# Patient Record
Sex: Male | Born: 1960
Health system: Southern US, Community
[De-identification: ages and names within clinical notes are randomized; demographics above are authoritative.]

## PROBLEM LIST (undated history)

## (undated) ENCOUNTER — Emergency Department (HOSPITAL_COMMUNITY): Admission: EM | Discharge status: Absent without leave | Payer: Self-pay

## (undated) DIAGNOSIS — M722 Plantar fascial fibromatosis: Secondary | ICD-10-CM

## (undated) DIAGNOSIS — M726 Necrotizing fasciitis: Secondary | ICD-10-CM

## (undated) DIAGNOSIS — F32A Depression, unspecified: Secondary | ICD-10-CM

## (undated) DIAGNOSIS — M1990 Unspecified osteoarthritis, unspecified site: Secondary | ICD-10-CM

## (undated) DIAGNOSIS — E7800 Pure hypercholesterolemia, unspecified: Secondary | ICD-10-CM

## (undated) DIAGNOSIS — E119 Type 2 diabetes mellitus without complications: Secondary | ICD-10-CM

## (undated) DIAGNOSIS — F329 Major depressive disorder, single episode, unspecified: Secondary | ICD-10-CM

## (undated) DIAGNOSIS — I10 Essential (primary) hypertension: Secondary | ICD-10-CM

## (undated) DIAGNOSIS — G4730 Sleep apnea, unspecified: Secondary | ICD-10-CM

---
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
## 2007-01-20 ENCOUNTER — Encounter: Admission: RE | Admit: 2007-01-20 | Discharge: 2007-01-20 | Payer: Self-pay | Admitting: Family Medicine

## 2007-01-20 ENCOUNTER — Ambulatory Visit: Payer: Self-pay | Admitting: Family Medicine

## 2007-01-25 ENCOUNTER — Encounter: Admission: RE | Admit: 2007-01-25 | Discharge: 2007-01-25 | Payer: Self-pay | Admitting: Family Medicine

## 2007-02-03 ENCOUNTER — Ambulatory Visit: Payer: Self-pay | Admitting: Family Medicine

## 2007-03-17 ENCOUNTER — Ambulatory Visit: Payer: Self-pay | Admitting: Family Medicine

## 2007-05-03 ENCOUNTER — Ambulatory Visit: Payer: Self-pay | Admitting: Family Medicine

## 2012-12-24 ENCOUNTER — Encounter (HOSPITAL_COMMUNITY): Payer: Self-pay | Admitting: Emergency Medicine

## 2012-12-24 ENCOUNTER — Emergency Department (HOSPITAL_COMMUNITY)
Admission: EM | Admit: 2012-12-24 | Discharge: 2012-12-25 | Disposition: A | Discharge status: Other Acute Inpatient Hospital | Payer: Self-pay | Attending: Emergency Medicine | Admitting: Emergency Medicine

## 2012-12-24 DIAGNOSIS — R45851 Suicidal ideations: Secondary | ICD-10-CM

## 2012-12-24 DIAGNOSIS — T50992A Poisoning by other drugs, medicaments and biological substances, intentional self-harm, initial encounter: Secondary | ICD-10-CM | POA: Insufficient documentation

## 2012-12-24 DIAGNOSIS — F3289 Other specified depressive episodes: Secondary | ICD-10-CM | POA: Insufficient documentation

## 2012-12-24 DIAGNOSIS — T1491XA Suicide attempt, initial encounter: Secondary | ICD-10-CM

## 2012-12-24 DIAGNOSIS — F329 Major depressive disorder, single episode, unspecified: Secondary | ICD-10-CM | POA: Insufficient documentation

## 2012-12-24 DIAGNOSIS — T450X4A Poisoning by antiallergic and antiemetic drugs, undetermined, initial encounter: Secondary | ICD-10-CM | POA: Insufficient documentation

## 2012-12-24 HISTORY — DX: Plantar fascial fibromatosis: M72.2

## 2012-12-24 HISTORY — DX: Essential (primary) hypertension: I10

## 2012-12-24 HISTORY — DX: Unspecified osteoarthritis, unspecified site: M19.90

## 2012-12-24 LAB — URINALYSIS W MICROSCOPIC + REFLEX CULTURE
Glucose, UA: >1000 mg/dL — AB
Hgb urine dipstick: NEGATIVE
Leukocytes, UA: NEGATIVE
Protein, ur: 30 mg/dL — AB
Specific Gravity, Urine: 1.031 — ABNORMAL HIGH (ref 1.005–1.030)
pH: 5 (ref 5.0–8.0)

## 2012-12-24 LAB — COMPREHENSIVE METABOLIC PANEL
ALT: 32 U/L (ref 0–53)
Albumin: 4.1 g/dL (ref 3.5–5.2)
Alkaline Phosphatase: 83 U/L (ref 39–117)
Chloride: 100 mEq/L (ref 96–112)
GFR calc Af Amer: >90 mL/min (ref >90)
Glucose, Bld: 72 mg/dL (ref 70–99)
Potassium: 3 mEq/L — ABNORMAL LOW (ref 3.5–5.1)
Sodium: 136 mEq/L (ref 135–145)
Total Bilirubin: 0.6 mg/dL (ref 0.3–1.2)
Total Protein: 7.9 g/dL (ref 6.0–8.3)

## 2012-12-24 LAB — SALICYLATE LEVEL: Salicylate Lvl: <2 mg/dL — ABNORMAL LOW (ref 2.8–20.0)

## 2012-12-24 LAB — ETHANOL: Alcohol, Ethyl (B): <11 mg/dL (ref 0–11)

## 2012-12-24 LAB — CBC WITH DIFFERENTIAL/PLATELET
Eosinophils Absolute: 0.1 10*3/uL (ref 0.0–0.7)
Hemoglobin: 16.7 g/dL (ref 13.0–17.0)
Lymphs Abs: 2.3 10*3/uL (ref 0.7–4.0)
MCH: 31.2 pg (ref 26.0–34.0)
Monocytes Relative: 9 % (ref 3–12)
Neutro Abs: 10.5 10*3/uL — ABNORMAL HIGH (ref 1.7–7.7)
Neutrophils Relative %: 74 % (ref 43–77)
Platelets: 233 10*3/uL (ref 150–400)
RBC: 5.36 MIL/uL (ref 4.22–5.81)
WBC: 14.1 10*3/uL — ABNORMAL HIGH (ref 4.0–10.5)

## 2012-12-24 LAB — ACETAMINOPHEN LEVEL: Acetaminophen (Tylenol), Serum: <15 ug/mL (ref 10–30)

## 2012-12-24 LAB — RAPID URINE DRUG SCREEN, HOSP PERFORMED
Amphetamines: NOT DETECTED
Opiates: NOT DETECTED

## 2012-12-24 MED ORDER — SODIUM CHLORIDE 0.9 % IV SOLN
INTRAVENOUS | Status: DC
  Administered 2012-12-24 – 2012-12-25 (×2): via INTRAVENOUS

## 2012-12-24 NOTE — ED Notes (Signed)
Patient has 2 bags of belongings (1 is a backpack) behind nurses station across from room.

## 2012-12-24 NOTE — BH Assessment (Signed)
Assessment Note  Eric Shaw is an 52 y.o. male. Pt brought to [facility] after being found by police having taken an overdose of bendryl.  Pt was evicted from his apartment yesterday and broke back into the apartment today to attempt suicide.  Upon assessment, pt is quite straightforward about his intent to kill himself: "I screwed up: I didn't kill myself."  Pt became quite tearful in describing that he has diabetes that is worsening, causing significant pain in his feet.  He worked at [employer] until a year ago when he was laid off but reports that the pain of being on his feet all day prior to being laid off had become unbearable.  Pt reports he currently has no money and now, no place to stay.  Pt states, "I just don't have the energy to fight anymore."  Pt denies HI/AV.  Pt denies history of any psych treatment previously.  Pt denies any substance abuse and UDS/BAC is negative.  Axis I: Major Depression, single episode Axis II: Deferred Axis III: No past medical history on file. Axis IV: economic problems and housing problems Axis V: 21-30 behavior considerably influenced by delusions or hallucinations OR serious impairment in judgment, communication OR inability to function in almost all areas  Past Medical History: No past medical history on file.  No past surgical history on file.  Family History: No family history on file.  Social History:  reports that he has never smoked. He does not have any smokeless tobacco history on file. He reports that he does not drink alcohol or use illicit drugs.  Additional Social History:  Alcohol / Drug Use Pain Medications: Pt denies any use of alcohol or drugs.  UDS and BAC both negative. History of alcohol / drug use?: No history of alcohol / drug abuse  CIWA: CIWA-Ar BP: 126/75 mmHg Pulse Rate: 99 COWS:    Allergies: Allergies not on file  Home Medications:  (Not in a hospital admission)  OB/GYN Status:  No LMP for male patient.  General  Assessment Data Location of Assessment: [facility] ACT Assessment: Yes Is this a Tele or Face-to-Face Assessment?: Face-to-Face Is this an Initial Assessment or a Re-assessment for this encounter?: Initial Assessment Living Arrangements: Alone Can pt return to current living arrangement?: No (evicted yesterda)     [facility] Crisis Care Plan Living Arrangements: Alone Name of Psychiatrist: none Name of Therapist: none     Risk to self Suicidal Ideation: Yes-Currently Present Suicidal Intent: Yes-Currently Present Is patient at risk for suicide?: Yes Suicidal Plan?: Yes-Currently Present Specify Current Suicidal Plan: overdose Access to Means: Yes Specify Access to Suicidal Means: otc medications What has been your use of drugs/alcohol within the last 12 months?: denies use Previous Attempts/Gestures: No Intentional Self Injurious Behavior: None Family Suicide History: No Recent stressful life event(s): Job Loss;Financial Problems;Other (Comment) (health problems, evicted yesterday) Persecutory voices/beliefs?: No Depression: Yes Depression Symptoms: Despondent;Tearfulness;Isolating;Fatigue;Loss of interest in usual pleasures Substance abuse history and/or treatment for substance abuse?: No Suicide prevention information given to non-admitted patients: Not applicable  Risk to Others Homicidal Ideation: No Thoughts of Harm to Others: No Current Homicidal Intent: No Current Homicidal Plan: No Access to Homicidal Means: No History of harm to others?: No Assessment of Violence: None Noted Does patient have access to weapons?: No Criminal Charges Pending?: Yes Describe Pending Criminal Charges: Breaking and entering Does patient have a court date: Yes Court Date: 01/26/13  Psychosis Hallucinations: None noted Delusions: None noted  Mental Status Report  Appear/Hygiene: Other (Comment) (casual) Eye Contact: Fair Motor Activity: Unremarkable Speech: Logical/coherent Level of  Consciousness: Quiet/awake Mood: Depressed Affect: Sad;Depressed Anxiety Level: None Thought Processes: Coherent;Relevant Judgement: Unimpaired Orientation: Person;Place;Time;Situation Obsessive Compulsive Thoughts/Behaviors: None  Cognitive Functioning Concentration: Normal Memory: Recent Intact;Remote Intact IQ: Average Insight: Good Impulse Control: Good Appetite: Good Weight Loss:  (unknown) Sleep: No Change Total Hours of Sleep: 9 Vegetative Symptoms: None  ADLScreening [facility] Assessment Services) Patient's cognitive ability adequate to safely complete daily activities?: Yes Patient able to express need for assistance with ADLs?: Yes Independently performs ADLs?: Yes (appropriate for developmental age)  Prior Inpatient Therapy Prior Inpatient Therapy: No  Prior Outpatient Therapy Prior Outpatient Therapy: No  ADL Screening (condition at time of admission) Patient's cognitive ability adequate to safely complete daily activities?: Yes Patient able to express need for assistance with ADLs?: Yes Independently performs ADLs?: Yes (appropriate for developmental age)       Abuse/Neglect Assessment (Assessment to be complete while patient is alone) Physical Abuse: Denies Verbal Abuse: Denies Sexual Abuse: Denies Exploitation of patient/patient's resources: Denies     Merchant navy officer (For Healthcare) Advance Directive: Patient does not have advance directive;Patient would not like information    Additional Information 1:1 In Past 12 Months?: No CIRT Risk: No Elopement Risk: No Does patient have medical clearance?: Yes     Disposition:  Disposition Initial Assessment Completed for this Encounter: Yes Disposition of Patient: Inpatient treatment program Type of inpatient treatment program: Adult  On Site Evaluation by:   Reviewed with Physician:    Lorri Frederick 12/24/2012 6:18 PM

## 2012-12-24 NOTE — ED Provider Notes (Signed)
CSN: [id number]     Arrival date & time 12/24/12  1113 History     First MD Initiated Contact with Patient 12/24/12 1120     Chief Complaint  Patient presents with  . Drug Overdose    HPI Pt was seen at 1140. Per Police, EMS and pt report, c/o gradual onset and worsening of persistent depression and SI for the past several days. Pt states he lost his job then was evicted from his apartment yesterday. Pt "broke into my apartment" and took "40 tablets" of benadryl 25mg  in SA at 0400 today. Pt states he was found by the maintenance man, who called 911. Pt states he "knew the pills would put me to sleep and stop my oxygen and I wouldn't fight it."  Denies HI, no N/V/D, no CP/SOB, no abd pain.    No past medical history on file.  No past surgical history on file.   History  Substance Use Topics  . Smoking status: Never Smoker   . Smokeless tobacco: Not on file  . Alcohol Use: No    Review of Systems ROS: Statement: All systems negative except as marked or noted in the HPI; Constitutional: Negative for fever and chills. ; ; Eyes: Negative for eye pain, redness and discharge. ; ; ENMT: Negative for ear pain, hoarseness, nasal congestion, sinus pressure and sore throat. ; ; Cardiovascular: Negative for chest pain, palpitations, diaphoresis, dyspnea and peripheral edema. ; ; Respiratory: Negative for cough, wheezing and stridor. ; ; Gastrointestinal: Negative for nausea, vomiting, diarrhea, abdominal pain, blood in stool, hematemesis, jaundice and rectal bleeding. . ; ; Genitourinary: Negative for dysuria, flank pain and hematuria. ; ; Musculoskeletal: Negative for back pain and neck pain. Negative for swelling and trauma.; ; Skin: Negative for pruritus, rash, abrasions, blisters, bruising and skin lesion.; ; Neuro: Negative for headache, lightheadedness and neck stiffness. Negative for weakness, altered level of consciousness , altered mental status, extremity weakness, paresthesias, involuntary  movement, seizure and syncope.; Psych:  +SI, +SA, no HI, no hallucinations.      Allergies  Review of patient's allergies indicates not on file.  Home Medications  No current outpatient prescriptions on file. BP 154/76  Pulse 123  Temp(Src) 97.8 F (36.6 C)  Resp 19  SpO2 97% Physical Exam 1145: Physical examination:  Nursing notes reviewed; Vital signs and O2 SAT reviewed;  Constitutional: Well developed, Well nourished, Well hydrated, In no acute distress; Head:  Normocephalic, atraumatic; Eyes: EOMI, PERRL, No scleral icterus; ENMT: Mouth and pharynx normal, Mucous membranes moist; Neck: Supple, Full range of motion, No lymphadenopathy; Cardiovascular: Tachycardic and rhythm, No gallop; Respiratory: Breath sounds clear & equal bilaterally, No rales, rhonchi, wheezes.  Speaking full sentences with ease, Normal respiratory effort/excursion; Chest: Nontender, Movement normal; Abdomen: Soft, Nontender, Nondistended, Normal bowel sounds; Genitourinary: No CVA tenderness; Extremities: Pulses normal, No tenderness, No edema, No calf edema or asymmetry.; Neuro: AA&Ox3, Major CN grossly intact.  Speech clear. No gross focal motor or sensory deficits in extremities.; Skin: Color normal, Warm, Dry.; Psych:  +SI, +SA, tearful.     ED Course   Procedures     MDM  MDM Reviewed: nursing note and vitals Interpretation: labs and ECG    Date: 12/24/2012  Rate: 119  Rhythm: sinus tachycardia  QRS Axis: normal  Intervals: normal  ST/T Wave abnormalities: nonspecific T wave changes  Conduction Disutrbances:none  Narrative Interpretation:   Old EKG Reviewed: none available.  Results for orders placed during the hospital encounter of  12/24/12  ACETAMINOPHEN LEVEL      Result Value Range   Acetaminophen (Tylenol), Serum <15.0  10 - 30 ug/mL  ETHANOL      Result Value Range   Alcohol, Ethyl (B) <11  0 - 11 mg/dL  SALICYLATE LEVEL      Result Value Range   Salicylate Lvl <2.0 (*) 2.8 -  20.0 mg/dL  URINE RAPID DRUG SCREEN (HOSP PERFORMED)      Result Value Range   Opiates NONE DETECTED  NONE DETECTED   Cocaine NONE DETECTED  NONE DETECTED   Benzodiazepines NONE DETECTED  NONE DETECTED   Amphetamines NONE DETECTED  NONE DETECTED   Tetrahydrocannabinol NONE DETECTED  NONE DETECTED   Barbiturates NONE DETECTED  NONE DETECTED  CBC WITH DIFFERENTIAL      Result Value Range   WBC 14.1 (*) 4.0 - 10.5 K/uL   RBC 5.36  4.22 - 5.81 MIL/uL   Hemoglobin 16.7  13.0 - 17.0 g/dL   HCT 13.0  86.5 - 78.4 %   MCV 84.5  78.0 - 100.0 fL   MCH 31.2  26.0 - 34.0 pg   MCHC 36.9 (*) 30.0 - 36.0 g/dL   RDW 69.6  29.5 - 28.4 %   Platelets 233  150 - 400 K/uL   Neutrophils Relative % 74  43 - 77 %   Neutro Abs 10.5 (*) 1.7 - 7.7 K/uL   Lymphocytes Relative 16  12 - 46 %   Lymphs Abs 2.3  0.7 - 4.0 K/uL   Monocytes Relative 9  3 - 12 %   Monocytes Absolute 1.2 (*) 0.1 - 1.0 K/uL   Eosinophils Relative 0  0 - 5 %   Eosinophils Absolute 0.1  0.0 - 0.7 K/uL   Basophils Relative 0  0 - 1 %   Basophils Absolute 0.1  0.0 - 0.1 K/uL  COMPREHENSIVE METABOLIC PANEL      Result Value Range   Sodium 136  135 - 145 mEq/L   Potassium 3.0 (*) 3.5 - 5.1 mEq/L   Chloride 100  96 - 112 mEq/L   CO2 23  19 - 32 mEq/L   Glucose, Bld 72  70 - 99 mg/dL   BUN 16  6 - 23 mg/dL   Creatinine, Ser 1.32  0.50 - 1.35 mg/dL   Calcium 44.0  8.4 - 10.2 mg/dL   Total Protein 7.9  6.0 - 8.3 g/dL   Albumin 4.1  3.5 - 5.2 g/dL   AST 61 (*) 0 - 37 U/L   ALT 32  0 - 53 U/L   Alkaline Phosphatase 83  39 - 117 U/L   Total Bilirubin 0.6  0.3 - 1.2 mg/dL   GFR calc non Af Amer >90  >90 mL/min   GFR calc Af Amer >90  >90 mL/min  PROTIME-INR      Result Value Range   Prothrombin Time 13.9  11.6 - 15.2 seconds   INR 1.09  0.00 - 1.49  APTT      Result Value Range   aPTT 29  24 - 37 seconds     1430:  ACT called, will eval for admission.         Laray Anger, DO 12/24/12 1639

## 2012-12-24 NOTE — ED Notes (Signed)
[employer] reports they were notified that pt broke into his apartment after being evicted yesterday. EMS called to scene, pt reports taking 40 25mg  benadryl in attempts to commit suicide. States he took pills at 0400 this morning. On [employer] arrival, they found only 27 open benadryl packages. Pt a+o on arrival. Denies etoh or drug use. bp 173/90, pulse 110, respirations 18, saO2 97% ra, CBG 72. [employer] at bedside

## 2012-12-24 NOTE — ED Notes (Signed)
Pt sleeping. Sitter at bedside.

## 2012-12-24 NOTE — Progress Notes (Signed)
Patient ID: Eric Shaw, male   DOB: [birth year], 52 y.o.   MRN: [id number] Asked to run pt who attempted suicide earlier for admission-UDS negative-pt accepted by [facility] 500-1 bed

## 2012-12-24 NOTE — ED Notes (Signed)
Bed: RESB Expected date:  Expected time:  Means of arrival:  Comments: Overdose on benadryl

## 2012-12-25 ENCOUNTER — Encounter (HOSPITAL_COMMUNITY): Payer: Self-pay | Admitting: *Deleted

## 2012-12-25 ENCOUNTER — Encounter (HOSPITAL_COMMUNITY): Payer: Self-pay | Admitting: Family Medicine

## 2012-12-25 ENCOUNTER — Inpatient Hospital Stay (HOSPITAL_COMMUNITY)
Admission: AD | Admit: 2012-12-25 | Discharge: 2012-12-31 | DRG: 885 | Disposition: A | Discharge status: Home / Self Care | Payer: No Typology Code available for payment source | Attending: Psychiatry | Admitting: Psychiatry

## 2012-12-25 DIAGNOSIS — F322 Major depressive disorder, single episode, severe without psychotic features: Secondary | ICD-10-CM | POA: Diagnosis present

## 2012-12-25 DIAGNOSIS — Z79899 Other long term (current) drug therapy: Secondary | ICD-10-CM

## 2012-12-25 DIAGNOSIS — E1149 Type 2 diabetes mellitus with other diabetic neurological complication: Secondary | ICD-10-CM | POA: Diagnosis present

## 2012-12-25 DIAGNOSIS — E1159 Type 2 diabetes mellitus with other circulatory complications: Secondary | ICD-10-CM

## 2012-12-25 DIAGNOSIS — E1142 Type 2 diabetes mellitus with diabetic polyneuropathy: Secondary | ICD-10-CM | POA: Diagnosis present

## 2012-12-25 DIAGNOSIS — M722 Plantar fascial fibromatosis: Secondary | ICD-10-CM | POA: Diagnosis present

## 2012-12-25 DIAGNOSIS — F332 Major depressive disorder, recurrent severe without psychotic features: Principal | ICD-10-CM | POA: Diagnosis present

## 2012-12-25 DIAGNOSIS — I10 Essential (primary) hypertension: Secondary | ICD-10-CM | POA: Diagnosis present

## 2012-12-25 DIAGNOSIS — E1140 Type 2 diabetes mellitus with diabetic neuropathy, unspecified: Secondary | ICD-10-CM | POA: Diagnosis present

## 2012-12-25 LAB — GLUCOSE, CAPILLARY
Glucose-Capillary: 260 mg/dL — ABNORMAL HIGH (ref 70–99)
Glucose-Capillary: 54 mg/dL — ABNORMAL LOW (ref 70–99)
Glucose-Capillary: 56 mg/dL — ABNORMAL LOW (ref 70–99)
Glucose-Capillary: 58 mg/dL — ABNORMAL LOW (ref 70–99)

## 2012-12-25 MED ORDER — INSULIN ASPART 100 UNIT/ML ~~LOC~~ SOLN
0.0000 [IU] | Freq: Three times a day (TID) | SUBCUTANEOUS | Status: DC
  Administered 2012-12-25: 3 [IU] via SUBCUTANEOUS
  Administered 2012-12-26: 12:00:00 via SUBCUTANEOUS
  Administered 2012-12-26: 5 [IU] via SUBCUTANEOUS
  Administered 2012-12-27 (×2): 2 [IU] via SUBCUTANEOUS
  Administered 2012-12-28: 3 [IU] via SUBCUTANEOUS
  Administered 2012-12-28: 2 [IU] via SUBCUTANEOUS
  Administered 2012-12-29 – 2012-12-30 (×3): 3 [IU] via SUBCUTANEOUS
  Administered 2012-12-31: 2 [IU] via SUBCUTANEOUS

## 2012-12-25 MED ORDER — INSULIN ASPART 100 UNIT/ML ~~LOC~~ SOLN
0.0000 [IU] | Freq: Every day | SUBCUTANEOUS | Status: DC
  Administered 2012-12-25: 3 [IU] via SUBCUTANEOUS
  Administered 2012-12-26: 2 [IU] via SUBCUTANEOUS

## 2012-12-25 MED ORDER — ALUM & MAG HYDROXIDE-SIMETH 200-200-20 MG/5ML PO SUSP: 30.0000 mL | ORAL | Status: DC | PRN

## 2012-12-25 MED ORDER — MAGNESIUM HYDROXIDE 400 MG/5ML PO SUSP: 30.0000 mL | Freq: Every day | ORAL | Status: DC | PRN

## 2012-12-25 MED ORDER — ACETAMINOPHEN 325 MG PO TABS
650.0000 mg | ORAL_TABLET | Freq: Four times a day (QID) | ORAL | Status: DC | PRN
  Administered 2012-12-25: 650 mg via ORAL

## 2012-12-25 MED ORDER — INSULIN GLARGINE 100 UNIT/ML ~~LOC~~ SOLN
55.0000 [IU] | Freq: Two times a day (BID) | SUBCUTANEOUS | Status: DC
  Administered 2012-12-25: 55 [IU] via SUBCUTANEOUS
  Administered 2012-12-26: 17:00:00 via SUBCUTANEOUS
  Administered 2012-12-26 – 2012-12-31 (×11): 55 [IU] via SUBCUTANEOUS

## 2012-12-25 NOTE — ED Notes (Signed)
CBG 118 

## 2012-12-25 NOTE — Progress Notes (Signed)
Psychoeducational Group Note  Date: 12/25/2012 Time:  1015  Group Topic/Focus:  Identifying Needs:   The focus of this group is to help patients identify their personal needs that have been historically problematic and identify healthy behaviors to address their needs.  Participation Level:  Active  Participation Quality:  Appropriate  Affect:  Appropriate  Cognitive:  Oriented  Insight:  Improving  Engagement in Group:  Engaged  Additional Comments:  Pt participated in the group, supported his peers and was supported by them.   Tierra Thoma A 

## 2012-12-25 NOTE — Progress Notes (Signed)
Patient ID: Eric Shaw, male   DOB: [birth year], 52 y.o.   MRN: [id number] 12-25-12@ 1630 1:1 nursing shift : D: pt has remained in the bed since his admission. He has no voiced any complaints. He remains a fall risk. He ate his lunch and will be given supper in his room. A: sitter remains with pt and rn is rounding on him about every hour. R: this pt remains depressed and has a very flat affect. Continues to contract for safety and denies hi/av. The 1:1 remains.

## 2012-12-25 NOTE — [facility] Suicide Risk Assessment (Signed)
Suicide Risk Assessment  Admission Assessment     Nursing information obtained from:  Patient Demographic factors:  Male;Low socioeconomic status;Unemployed Current Mental Status:  Suicidal ideation indicated by patient Loss Factors:  Decrease in vocational status;Legal issues;Financial problems / change in socioeconomic status Historical Factors:  NA Risk Reduction Factors:  Positive social support  CLINICAL FACTORS:   Depression:   Anhedonia Hopelessness Impulsivity Insomnia Severe  COGNITIVE FEATURES THAT CONTRIBUTE TO RISK:  Closed-mindedness Polarized thinking Thought constriction (tunnel vision)    SUICIDE RISK:   Severe:  Frequent, intense, and enduring suicidal ideation, specific plan, no subjective intent, but some objective markers of intent (i.e., choice of lethal method), the method is accessible, some limited preparatory behavior, evidence of impaired self-control, severe dysphoria/symptomatology, multiple risk factors present, and few if any protective factors, particularly a lack of social support.  PLAN OF CARE:  Supportive approach/coping skills                                CBT/mindfulness                                Reassess for antidepressants                                 I certify that inpatient services furnished can reasonably be expected to improve the patient's condition.  Apphia Cropley A 12/25/2012, 3:17 PM

## 2012-12-25 NOTE — [facility] Group Notes (Signed)
[facility] Group Notes: (Clinical Social Work)   12/25/2012      Type of Therapy:  Group Therapy   Participation Level:  Did Not Attend    Ambrose Mantle, LCSW 12/25/2012, 4:30 PM

## 2012-12-25 NOTE — ED Notes (Signed)
CBG 198 

## 2012-12-25 NOTE — Progress Notes (Signed)
Patient ID: Eric Shaw, male   DOB: 07/02/1960, 52 y.o.   MRN: [id number] D: Client is in his room sitting on the bed talking to MHT 1:1. Affect is sad; describes mood is "I'm happy as a clam"; he is ambivalent about rating his depression and anxiety; appearance is WNL. Client is sarcastic during 1:1, "that's just my dark death humor". Denies current SI/HI  A: Continue to encourage group attendance and participation, offer support and assistance in identifying triggers and development of coping skills and encouraged client to share thoughts and feelings to staff. Maintained safety through q 15 minute safety checks by staff.    R: Client is medication compliant and verbalizes safety to staff. He does contract for safety but due to his ambivalence, he continues to be a risk for harm to self, "I didn't take enough (benedryl."

## 2012-12-25 NOTE — Progress Notes (Signed)
Patient ID: Eric Shaw, male   DOB: 11/25/1960, 52 y.o.   MRN: [id number] 12-25-12@ 0745 nursing admission note: pt came in with depression and was found by [employer]pd after taking an overdose of benadryl. He rated his depression at 5 on a scale of 1-10. He was able to contract for safety and denied any hi/av. His stressors are he has some legal issues, unemployed for 18 months and is homeless. After being evicted from his apt he broke in and was arrested for B&E. His admission orders consisted of being on close observation/1:1. His is a diabetic and his CBG on admission was 144. He is a fall risk, had neuropathic pain in both legs, wears reading glasses and has no allergies. He is a smoker of less than 1/2 ppd and refused the nicotine patch. He stated he is single, no children and has no family.  He was escorted to the 500 hall. This pt was pleasant/cooperative and escorted to the 500 hall. Shon Baton w,RN did the admission and will keep the pt throughout the shift.

## 2012-12-25 NOTE — ED Notes (Addendum)
Pt given 2 containers of orange juice and peanut butter with crackers.  Pt alert

## 2012-12-25 NOTE — ED Notes (Addendum)
Pt belongings 1 pt belonging bag with pants,shirts, shoes. 1 black back pack with lap top, ipad, glucose meter, cords, battery, scissors, pens, lantus, food, pens, socks, and a wallet containing two 1 dollar bills and credit cards. Locked in locker 36 by this EMT and Security officer [place]. Also a set of keys

## 2012-12-25 NOTE — Tx Team (Signed)
Initial Interdisciplinary Treatment Plan  PATIENT STRENGTHS: (choose at least two) Average or above average intelligence Supportive family/friends  PATIENT STRESSORS: Legal issue depression    PROBLEM LIST: Problem List/Patient Goals Date to be addressed Date deferred Reason deferred Estimated date of resolution  MD, single episode  12-25-12           Suicide ideation  12-25-12           Diabetes type II 12-25-12           Homeless 12-25-12                  DISCHARGE CRITERIA:  Improved stabilization in mood, thinking, and/or behavior Reduction of life-threatening or endangering symptoms to within safe limits Verbal commitment to aftercare and medication compliance  PRELIMINARY DISCHARGE PLAN: Attend aftercare/continuing care group Placement in alternative living arrangements  PATIENT/FAMIILY INVOLVEMENT: This treatment plan has been presented to and reviewed with the patient, Yecheskel Kurek, and/or family member,   The patient and family have been given the opportunity to ask questions and make suggestions.  Valente David 12/25/2012, 9:18 AM

## 2012-12-25 NOTE — ED Notes (Signed)
Pt given a sprite, ham sandwich, and cheese.
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
## 2012-12-25 NOTE — Progress Notes (Signed)
Patient ID: Unnamed Zeien, male   DOB: 1960-06-17, 52 y.o.   MRN: [id number] 12-25-12 @0830  1:1 note: d: pt came to the unit and is having some suicide ideation; he is able to contract for safety and denies any hi/av. He has been tearful, having some decreased concentration and rather evasive during the admission process. The order was implemented for 1:1 and the sitter is with him. He was given hydration on admission and given something to eat. A: he was somewhat receptive to the 1:1 and was ok with having meals in his room.also told him possible d/c of the 1:1 will be reviewed after the md and extender talk with him. R: he later stated he didn't feel like the 1:1 was necessary. The 1:1 continues.

## 2012-12-25 NOTE — [facility] Group Notes (Signed)
[facility] Group Notes:  (Nursing/MHT/Case Management/Adjunct)  Date:  12/25/2012  Time:  12:11 PM  Type of Therapy:  Psychoeducational Skills  Participation Level:  Did Not Attend  Summary of Progress/Problems: Pt did not attend group he was a new admission.   Jacquelyne Balint Shanta 12/25/2012, 12:11 PM

## 2012-12-25 NOTE — Progress Notes (Signed)
Patient ID: Eric Shaw, male   DOB: [birth year], 52 y.o.   MRN: [id number] 12-25-12 @1230  1:1 nursing note: D: pt stayed in bed most of the am after his admission. He remains safe and ate his breakfast. He bilateral leg pain was resolved. A: staff continues to support this pt and he has voiced no needs or complaints. Md/extender was made aware of this new admission and staff is awaiting any new orders. R: pt was advised he would be seeing MD or extender. This 1:1 continues.

## 2012-12-26 LAB — HEMOGLOBIN A1C: Hgb A1c MFr Bld: 9.8 % — ABNORMAL HIGH (ref <5.7)

## 2012-12-26 LAB — GLUCOSE, CAPILLARY: Glucose-Capillary: 91 mg/dL (ref 70–99)

## 2012-12-26 MED ORDER — GABAPENTIN 300 MG PO CAPS
300.0000 mg | ORAL_CAPSULE | Freq: Three times a day (TID) | ORAL | Status: DC
  Administered 2012-12-26 – 2012-12-29 (×11): 300 mg via ORAL
  Filled 2012-12-26 (×17): qty 1

## 2012-12-26 MED ORDER — DULOXETINE HCL 30 MG PO CPEP
30.0000 mg | ORAL_CAPSULE | Freq: Two times a day (BID) | ORAL | Status: DC
  Administered 2012-12-26 – 2012-12-28 (×5): 30 mg via ORAL
  Filled 2012-12-26 (×10): qty 1

## 2012-12-26 MED ORDER — IBUPROFEN 800 MG PO TABS
800.0000 mg | ORAL_TABLET | Freq: Three times a day (TID) | ORAL | Status: DC | PRN
  Administered 2012-12-26: 800 mg via ORAL
  Filled 2012-12-26: qty 1

## 2012-12-26 MED ORDER — LISINOPRIL 10 MG PO TABS
10.0000 mg | ORAL_TABLET | Freq: Every day | ORAL | Status: DC
  Administered 2012-12-26 – 2012-12-31 (×6): 10 mg via ORAL
  Filled 2012-12-26: qty 14
  Filled 2012-12-26 (×8): qty 1

## 2012-12-26 NOTE — Progress Notes (Signed)
1:1 Nursing note: Pt resting comfortably in bed with eyes closed. Breathing even and unlabored. 1:1 monitoring continued.  

## 2012-12-26 NOTE — Progress Notes (Signed)
The focus of this group is to help patients review their daily goal of treatment and discuss progress on daily workbooks.  During wrap up group tonight, Maxton shared that he had a "better" day today because his pain was manageable and he had gotten a good night's sleep. Patient reported that he was able to maintain a decent, stable mood all day. When asked to identify his support systems that he plans to use once he is discharged, Gladstone stated that he plans to open up to his friend and the friend's family. Patient described his relationship with this family as being a second family to him. 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

## 2012-12-26 NOTE — Progress Notes (Signed)
1:1 Nursing note: Pt resting comfortably in bed with eyes closed. Breathing even and unlabored. 1:1 monitoring continued.

## 2012-12-26 NOTE — Progress Notes (Signed)
Patient ID: Eric Shaw, male   DOB: [birth year], 52 y.o.   MRN: [id number] 12-26-12@ 1700 nursing 1:1 note: d: pt went to the late afternoon got after encouragement from the extender and the RN. He wanted to go outside to recreation but was unable to go due to being on a 1:1. A: praised the pt for going to group he remains with passive SI and apologized to him that he couldn't go to recreation. R:will continue to assess pt and the 1:1 continues.

## 2012-12-26 NOTE — Progress Notes (Signed)
Patient ID: Eric Shaw, male   DOB: 28-Jul-1960, 52 y.o.   MRN: [id number] 12-26-12 @ 0900 nursing 1:1 note; D: pt has been in bed most of the am. He did request ibuprofen for his leg pain. He remains with passive SI but is able to give a vague contract for safety. A: RN obtained order for ibuprofen 800 mg Q 8 hrs prn and administered it. Continuing to round on this patient every hour due to the vague contract as well as him being on a 1:1. RN will attempt to obtain an order for neurontin for the neuropathy in both of his legs.  R: pt was thankful for the ibuprofen. He is not going to groups. The 1:1 continues.

## 2012-12-26 NOTE — Progress Notes (Signed)
Patient ID: Eric Shaw, male   DOB: 1960-11-15, 52 y.o.   MRN: [id number] 12-26-12 @ 1300 Nursing 1:1 note: D: pt has been in bed most of the am. He remains with passive SI and gives a vague contract for safety. He was having neuropathic painin his legs and was given ibuprofen for the pain. New orders were obtained for cymbalta and neurontin. A: new order medications were administered and the pt's 1:1 was renewed. R: his pain was reduced and he seems to be tolerating his new medications well. On his inventory sheet he wrote: slept poor, appetite good, energy low, attention good with his depression and hopelessness both at 5. After discharge he plans to maintain his medications. He stated he will have a problem staying on discharge medications due to cost. The 1:1 remains.

## 2012-12-26 NOTE — [facility] Group Notes (Signed)
[facility] Group Notes:  (Clinical Social Work)  12/26/2012   3:00-4:00PM  Summary of Progress/Problems:   The main focus of today's process group was to   identify the patient's current support system and decide on other supports that can be put in place.  The picture on workbook was used to discuss why additional supports are needed, and a hand-out was distributed with four definitions/levels of support, then used to talk about how patients have given and received all different kinds of support.  An emphasis was placed on using counselor, doctor, therapy groups, 12-step groups, and problem-specific support groups to expand supports.  The patient identified present supports as 2 friends and himself.  Type of Therapy:  Process Group  Participation Level:  Minimal  Participation Quality:  Attentive  Affect:  Blunted  Cognitive:  Oriented  Insight:  Developing/Improving  Engagement in Therapy:  Developing/Improving  Modes of Intervention:  Education,  Support and Processing  Ambrose Mantle, LCSW 12/26/2012, 4:36 PM

## 2012-12-26 NOTE — Progress Notes (Signed)
Patient ID: Eric Shaw, male   DOB: [birth year], 52 y.o.   MRN: [id number] Psychoeducational Group Note  Date:  12/26/2012 Time:  1100am  Group Topic/Focus:  Making Healthy Choices:   The focus of this group is to help patients identify negative/unhealthy choices they were using prior to admission and identify positive/healthier coping strategies to replace them upon discharge.  Participation Level:  Did Not Attend  Participation Quality:    Affect:  Cognitive:  Insight: Engagement in Group:  Valente David 12/26/2012,1:07 PM

## 2012-12-26 NOTE — [facility] Group Notes (Signed)
[facility] Group Notes:  (Nursing/MHT/Case Management/Adjunct)  Date:  12/26/2012  Time:  10:54 AM  Type of Therapy:  Psychoeducational Skills  Participation Level:  Did Not Attend  Eric Shaw 12/26/2012, 10:54 AM

## 2012-12-26 NOTE — Progress Notes (Signed)
1:1 Nursing note ( late entry) Patient attended group and participated tonight. Patient reports having had a better day due to his new medications that have helped to manage his pain. Patient reports passive si and verbally contracts, denies hi/a/v hallucinations. Patient remains on 1:1 for safety  With mht at pt's side. Will continue to monitor.

## 2012-12-26 NOTE — Progress Notes (Signed)
Adult Psychoeducational Group Note  Date:  12/26/2012 Time:  1:48 PM  Group Topic/Focus:  Conflict Resolution:   The focus of this group is to discuss the conflict resolution process and how it may be used upon discharge.   Roanna Banning, [place] N 12/26/2012, 1:48 PM

## 2012-12-26 NOTE — Progress Notes (Signed)
[facility] MD Progress Note  12/26/2012 3:46 PM Eric Shaw  MRN:  [id number] Subjective:  I feel better because I slept today. The medication has helped and my pain is significantly less, it's down to a 1/10 but it was at a 7/10 when I came in. He has not attended groups yet due to sleeping. He rates his depression as a 4/10 and his anxiety at a 4/10. He continues to minimize his his symptoms.  His affect is in direct opposition to his reports of depression. He is flat affected, appears hopeless and helpless, speech is goal directed. He does report that the SI is present, and he is unable to contract for safety at this time.  Diagnosis:  MDD severe with suicide attempt DSM5: Schizophrenia Disorders:  NA Obsessive-Compulsive Disorders:  NA Trauma-Stressor Disorders:  NA Substance/Addictive Disorders:  NA Depressive Disorders:  MDD severe recurrent with suicide attempt    ADL's:  Impaired  Sleep: Poor  Appetite:  Fair  Suicidal Ideation:  Yes, with intent, no plan can not contract for safety at this time. Homicidal Ideation:  denies AEB (as evidenced by):  Psychiatric Specialty Exam: Review of Systems  Constitutional: Negative.  Negative for fever, chills, weight loss, malaise/fatigue and diaphoresis.  HENT: Negative for congestion and sore throat.   Eyes: Negative for blurred vision, double vision and photophobia.  Respiratory: Negative for cough, shortness of breath and wheezing.   Cardiovascular: Negative for chest pain, palpitations and PND.  Gastrointestinal: Negative for heartburn, nausea, vomiting, abdominal pain, diarrhea and constipation.  Musculoskeletal: Negative for myalgias, joint pain and falls.  Neurological: Negative for dizziness, tingling, tremors, sensory change, speech change, focal weakness, seizures, loss of consciousness, weakness and headaches.  Endo/Heme/Allergies: Negative for polydipsia. Does not bruise/bleed easily.  Psychiatric/Behavioral: Negative for  depression, suicidal ideas, hallucinations, memory loss and substance abuse. The patient is not nervous/anxious and does not have insomnia.     Blood pressure 104/65, pulse 77, temperature 98.1 F (36.7 C), temperature source Oral, resp. rate 18, height 5' 11.5" (1.816 m), weight 102.059 kg (225 lb), SpO2 99.00%.Body mass index is 30.95 kg/(m^2).  General Appearance: Disheveled  Eye Solicitor::  Fair  Speech:  Clear and Coherent  Volume:  Decreased  Mood:  Depressed  Affect:  Blunt and Flat  Thought Process:  Goal Directed  Orientation:  Full (Time, Place, and Person)  Thought Content:  WDL  Suicidal Thoughts:  Yes with intent, no plan  Homicidal Thoughts:  No  Memory:  Immediate;   Fair  Judgement:  Impaired  Insight:  Lacking  Psychomotor Activity:  Decreased  Concentration:  Fair  Recall:  Fair  Akathisia:  No  Handed:  Right  AIMS (if indicated):     Assets:  Communication Skills  Sleep:      Current Medications: Current Facility-Administered Medications  Medication Dose Route Frequency Provider Last Rate Last Dose  . acetaminophen (TYLENOL) tablet 650 mg  650 mg Oral Q6H PRN Court Joy, PA-C   650 mg at 12/25/12 0941  . alum & mag hydroxide-simeth (MAALOX/MYLANTA) 200-200-20 MG/5ML suspension 30 mL  30 mL Oral Q4H PRN Court Joy, PA-C      . DULoxetine (CYMBALTA) DR capsule 30 mg  30 mg Oral BID Verne Spurr, PA-C   30 mg at 12/26/12 1101  . gabapentin (NEURONTIN) capsule 300 mg  300 mg Oral TID Verne Spurr, PA-C   300 mg at 12/26/12 1101  . ibuprofen (ADVIL,MOTRIN) tablet 800 mg  800 mg Oral  Q8H PRN Rachael Fee, MD   800 mg at 12/26/12 1610  . insulin aspart (novoLOG) injection 0-15 Units  0-15 Units Subcutaneous TID WC Verne Spurr, PA-C      . insulin aspart (novoLOG) injection 0-5 Units  0-5 Units Subcutaneous QHS Verne Spurr, PA-C   3 Units at 12/25/12 2126  . insulin glargine (LANTUS) injection 55 Units  55 Units Subcutaneous BID Verne Spurr, PA-C    55 Units at 12/26/12 0803  . lisinopril (PRINIVIL,ZESTRIL) tablet 10 mg  10 mg Oral Daily Verne Spurr, PA-C   10 mg at 12/26/12 1101  . magnesium hydroxide (MILK OF MAGNESIA) suspension 30 mL  30 mL Oral Daily PRN Court Joy, PA-C        Lab Results:  Results for orders placed during the hospital encounter of 12/25/12 (from the past 48 hour(s))  GLUCOSE, CAPILLARY     Status: Abnormal   Collection Time    12/25/12  8:19 AM      Result Value Range   Glucose-Capillary 144 (*) 70 - 99 mg/dL  GLUCOSE, CAPILLARY     Status: Abnormal   Collection Time    12/25/12  5:30 PM      Result Value Range   Glucose-Capillary 180 (*) 70 - 99 mg/dL   Comment 1 Documented in Chart     Comment 2 Notify RN    HEMOGLOBIN A1C     Status: Abnormal   Collection Time    12/25/12  7:27 PM      Result Value Range   Hemoglobin A1C 9.8 (*) <5.7 %   Comment: (NOTE)                                                                               According to the ADA Clinical Practice Recommendations for 2011, when     HbA1c is used as a screening test:      >=6.5%   Diagnostic of Diabetes Mellitus               (if abnormal result is confirmed)     5.7-6.4%   Increased risk of developing Diabetes Mellitus     References:Diagnosis and Classification of Diabetes Mellitus,Diabetes     Care,2011,34(Suppl 1):S62-S69 and Standards of Medical Care in             Diabetes - 2011,Diabetes Care,2011,34 (Suppl 1):S11-S61.   Mean Plasma Glucose 235 (*) <117 mg/dL   Comment: Performed at [facility]  GLUCOSE, CAPILLARY     Status: Abnormal   Collection Time    12/25/12  9:11 PM      Result Value Range   Glucose-Capillary 260 (*) 70 - 99 mg/dL   Comment 1 Notify RN    GLUCOSE, CAPILLARY     Status: None   Collection Time    12/26/12  6:27 AM      Result Value Range   Glucose-Capillary 91  70 - 99 mg/dL  GLUCOSE, CAPILLARY     Status: Abnormal   Collection Time    12/26/12 11:30 AM      Result Value  Range   Glucose-Capillary 139 (*) 70 - 99 mg/dL  Comment 1 Notify RN      Physical Findings: AIMS: Facial and Oral Movements Muscles of Facial Expression: None, normal Lips and Perioral Area: None, normal Jaw: None, normal Tongue: None, normal,Extremity Movements Upper (arms, wrists, hands, fingers): None, normal Lower (legs, knees, ankles, toes): None, normal, Trunk Movements Neck, shoulders, hips: None, normal, Overall Severity Severity of abnormal movements (highest score from questions above): None, normal Incapacitation due to abnormal movements: None, normal Patient's awareness of abnormal movements (rate only patient's report): No Awareness, Dental Status Current problems with teeth and/or dentures?: No Does patient usually wear dentures?: No  CIWA:  CIWA-Ar Total: 0 COWS:     Treatment Plan Summary: Daily contact with patient to assess and evaluate symptoms and progress in treatment Medication management  Plan: 1. Continue crisis management and stabilization. 2. Medication management to reduce current symptoms to base line and improve patient's overall level of functioning 3. Treat health problems as indicated. 4. Develop treatment plan to decrease risk of relapse upon discharge and the need for     readmission. 5. Psycho-social education regarding relapse prevention and self care. 6. Health care follow up as needed for medical problems. 7. Continue home medications where appropriate. 8. Will continue 1:1 for safety due to suicidal ideation with intent. 9. Patient will attend groups with 1:1 present. 10. Patient will go to cafeteria for meals with 1:1 present. 11. ELOS: 3-5 days   Medical Decision Making Problem Points:  Established problem, stable/improving (1) Data Points:  Review or order medicine tests (1)  I certify that inpatient services furnished can reasonably be expected to improve the patient's condition.  Rona Ravens. Fares Ramthun RPAC 3:52 PM 12/26/2012

## 2012-12-26 NOTE — Progress Notes (Signed)
Nutrition Brief Note  Patient identified on the Malnutrition Screening Tool (MST) Report  Wt Readings from Last 15 Encounters:  12/25/12 225 lb (102.059 kg)    Body mass index is 30.95 kg/(m^2). Patient meets criteria for obesity, unspecified based on current BMI.   Current diet order is Carbohydrate modified, patient is consuming approximately 100% of meals at this time. Labs and medications reviewed.   Patient reports some weight loss, but unable to state how much. He wasn't actively trying to lose weight, but weight loss is desirable.   No nutrition interventions warranted at this time. If nutrition issues arise, please consult RD.   Linnell Fulling, RD, LDN Pager #: [phone] After-Hours Pager #: [phone]

## 2012-12-27 DIAGNOSIS — F332 Major depressive disorder, recurrent severe without psychotic features: Principal | ICD-10-CM

## 2012-12-27 DIAGNOSIS — X838XXA Intentional self-harm by other specified means, initial encounter: Secondary | ICD-10-CM

## 2012-12-27 LAB — GLUCOSE, CAPILLARY

## 2012-12-27 NOTE — Progress Notes (Signed)
Post 1:1 note-  Eric Shaw continues to have appropriate behavior with group attendance and peer interaction.  Denies SI.  No physical complaints and no prn medications requested. Continue POC and 15' checks for safety.

## 2012-12-27 NOTE — Progress Notes (Signed)
Nursing post 1:1 note- Patient is out in milieu interacting with peers.  States his day has "improved" and denies SI.  Contracts for safety. Compliant with scheduled medications. Affect is bright. Down to cafeteria with peers.  Continue POC and 15' checks for safety.  Remains safe.

## 2012-12-27 NOTE — [facility] Group Notes (Signed)
[facility] LCSW Group Therapy  12/27/2012  1:15 PM   Type of Therapy:  Group Therapy  Participation Level:  Active  Participation Quality:  Appropriate and Attentive  Affect:  Appropriate, Flat and Depressed  Cognitive:  Alert and Appropriate  Insight:  Developing/Improving and Engaged  Engagement in Therapy:  Developing/Improving and Engaged  Modes of Intervention:  Clarification, Confrontation, Discussion, Education, Exploration, Limit-setting, Orientation, Problem-solving, Rapport Building, Dance movement psychotherapist, Socialization and Support  Summary of Progress/Problems: Pt identified obstacles faced currently and processed barriers involved in overcoming these obstacles. Pt identified steps necessary for overcoming these obstacles and explored motivation (internal and external) for facing these difficulties head on. Pt further identified one area of concern in their lives and chose a goal to focus on for today.  Pt shared that his biggest obstacle is life, as he feels he is as low as he can get right now.  Pt states that he plans to focus on getting well and arranging discharge plans, but feels hopeless at this time.  Pt actively participated and was engaged in group discussion.    Eric Shaw, [place] 12/27/2012 2:24 PM

## 2012-12-27 NOTE — [facility] Group Notes (Signed)
[facility] LCSW Aftercare Discharge Planning Group Note   12/27/2012  8:45 AM  Participation Quality:  Alert and Appropriate   Mood/Affect:  Appropriate, Flat and Depressed  Depression Rating:  5  Anxiety Rating:  10  Thoughts of Suicide:  Pt denies SI/HI  Will you contract for safety?   Yes  Current AVH:  Pt denies  Plan for Discharge/Comments:  Pt attended discharge planning group and actively participated in group.  CSW provided pt with today's workbook.  Pt states that he came here for depression and SI.  Pt reports he still doing "rough" today.  Pt states that he was staying in an apartment for the past 4 years but lost his job and due to unable to pay he was evicted on Thursday.  Pt is now homeless.  Pt denies having follow up.  CSW will assess for pt's needs and referrals.  No further needs voiced by pt at this time.    Transportation Means: Pt reports access to transportation  Supports: No supports mentioned at this time  Eric Shaw, LCSWA 12/27/2012 10:00 AM
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
## 2012-12-27 NOTE — Tx Team (Signed)
Interdisciplinary Treatment Plan Update (Adult)  Date: 12/27/2012  Time Reviewed:  9:45 AM  Progress in Treatment: Attending groups: Yes Participating in groups:  Yes Taking medication as prescribed:  Yes Tolerating medication:  Yes Family/Significant othe contact made: No, pt refused  Patient understands diagnosis:  Yes Discussing patient identified problems/goals with staff:  Yes Medical problems stabilized or resolved:  Yes Denies suicidal/homicidal ideation: Yes Issues/concerns per patient self-inventory:  Yes Other:  New problem(s) identified: N/A  Discharge Plan or Barriers: CSW assessing for appropriate referrals.  Pt is homeless as well.    Reason for Continuation of Hospitalization: Anxiety Depression Medication Stabilization  Comments: N/A  Estimated length of stay: 3-5 days  For review of initial/current patient goals, please see plan of care.  Attendees: Patient:     Family:     Physician:  Dr. Javier Glazier 12/27/2012 10:30 AM   Nursing:   Quintella Reichert, RN 12/27/2012 10:30 AM   Clinical Social Worker:  Reyes Ivan, LCSWA 12/27/2012 10:30 AM   Other: Neill Loft, RN 12/27/2012 10:30 AM   Other:  Frankey Shown, MA care coordination 12/27/2012 10:30 AM   Other:  Juline Patch, LCSW 12/27/2012 10:30 AM   Other:     Other:    Other:    Other:    Other:    Other:    Other:     Scribe for Treatment Team:   Carmina Miller, 12/27/2012 10:30 AM

## 2012-12-27 NOTE — Progress Notes (Signed)
Patient ID: Eric Shaw, male   DOB: 1960/07/05, 52 y.o.   MRN: [id number] [facility] MD Progress Note  12/27/2012 4:13 PM Eric Shaw  MRN:  [id number]  Subjective: Patient continued to feel depressed and the suicidal but contracts for safety in the hospital. Patient reported his medication is working for him without side effects. Patient has been placed on one-to-one unwilling to be observed every 15 minutes. He rates his depression as a 4/10 and his anxiety at a 4/10.  Diagnosis:  MDD severe with suicide attempt DSM5: Schizophrenia Disorders:  NA Obsessive-Compulsive Disorders:  NA Trauma-Stressor Disorders:  NA Substance/Addictive Disorders:  NA  Depressive Disorders:  MDD severe recurrent with suicide attempt  ADL's:  Impaired  Sleep: Poor  Appetite:  Fair  Suicidal Ideation:  Yes, with intent, no plan can not contract for safety at this time. Homicidal Ideation:  denies AEB (as evidenced by):  Psychiatric Specialty Exam: Review of Systems  Constitutional: Negative.  Negative for fever, chills, weight loss, malaise/fatigue and diaphoresis.  HENT: Negative for congestion and sore throat.   Eyes: Negative for blurred vision, double vision and photophobia.  Respiratory: Negative for cough, shortness of breath and wheezing.   Cardiovascular: Negative for chest pain, palpitations and PND.  Gastrointestinal: Negative for heartburn, nausea, vomiting, abdominal pain, diarrhea and constipation.  Musculoskeletal: Negative for myalgias, joint pain and falls.  Neurological: Negative for dizziness, tingling, tremors, sensory change, speech change, focal weakness, seizures, loss of consciousness, weakness and headaches.  Endo/Heme/Allergies: Negative for polydipsia. Does not bruise/bleed easily.  Psychiatric/Behavioral: Negative for depression, suicidal ideas, hallucinations, memory loss and substance abuse. The patient is not nervous/anxious and does not have insomnia.     Blood  pressure 125/75, pulse 98, temperature 98.6 F (37 C), temperature source Oral, resp. rate 18, height 5' 11.5" (1.816 m), weight 102.059 kg (225 lb), SpO2 99.00%.Body mass index is 30.95 kg/(m^2).  General Appearance: Disheveled  Eye Solicitor::  Fair  Speech:  Clear and Coherent  Volume:  Decreased  Mood:  Depressed  Affect:  Blunt and Flat  Thought Process:  Goal Directed  Orientation:  Full (Time, Place, and Person)  Thought Content:  WDL  Suicidal Thoughts:  Yes with intent, no plan  Homicidal Thoughts:  No  Memory:  Immediate;   Fair  Judgement:  Impaired  Insight:  Lacking  Psychomotor Activity:  Decreased  Concentration:  Fair  Recall:  Fair  Akathisia:  No  Handed:  Right  AIMS (if indicated):     Assets:  Communication Skills  Sleep:  Number of Hours: 4.5   Current Medications: Current Facility-Administered Medications  Medication Dose Route Frequency Provider Last Rate Last Dose  . acetaminophen (TYLENOL) tablet 650 mg  650 mg Oral Q6H PRN Court Joy, PA-C   650 mg at 12/25/12 0941  . alum & mag hydroxide-simeth (MAALOX/MYLANTA) 200-200-20 MG/5ML suspension 30 mL  30 mL Oral Q4H PRN Court Joy, PA-C      . DULoxetine (CYMBALTA) DR capsule 30 mg  30 mg Oral BID Verne Spurr, PA-C   30 mg at 12/27/12 0102  . gabapentin (NEURONTIN) capsule 300 mg  300 mg Oral TID Verne Spurr, PA-C   300 mg at 12/27/12 1141  . ibuprofen (ADVIL,MOTRIN) tablet 800 mg  800 mg Oral Q8H PRN Rachael Fee, MD   800 mg at 12/26/12 0925  . insulin aspart (novoLOG) injection 0-15 Units  0-15 Units Subcutaneous TID WC Verne Spurr, PA-C   2 Units at 12/27/12  1139  . insulin aspart (novoLOG) injection 0-5 Units  0-5 Units Subcutaneous QHS Verne Spurr, PA-C   2 Units at 12/26/12 2155  . insulin glargine (LANTUS) injection 55 Units  55 Units Subcutaneous BID Verne Spurr, PA-C   55 Units at 12/27/12 [id number]  . lisinopril (PRINIVIL,ZESTRIL) tablet 10 mg  10 mg Oral Daily Verne Spurr, PA-C    10 mg at 12/27/12 9562  . magnesium hydroxide (MILK OF MAGNESIA) suspension 30 mL  30 mL Oral Daily PRN Court Joy, PA-C        Lab Results:  Results for orders placed during the hospital encounter of 12/25/12 (from the past 48 hour(s))  GLUCOSE, CAPILLARY     Status: Abnormal   Collection Time    12/25/12  5:30 PM      Result Value Range   Glucose-Capillary 180 (*) 70 - 99 mg/dL   Comment 1 Documented in Chart     Comment 2 Notify RN    HEMOGLOBIN A1C     Status: Abnormal   Collection Time    12/25/12  7:27 PM      Result Value Range   Hemoglobin A1C 9.8 (*) <5.7 %   Comment: (NOTE)                                                                               According to the ADA Clinical Practice Recommendations for 2011, when     HbA1c is used as a screening test:      >=6.5%   Diagnostic of Diabetes Mellitus               (if abnormal result is confirmed)     5.7-6.4%   Increased risk of developing Diabetes Mellitus     References:Diagnosis and Classification of Diabetes Mellitus,Diabetes     Care,2011,34(Suppl 1):S62-S69 and Standards of Medical Care in             Diabetes - 2011,Diabetes Care,2011,34 (Suppl 1):S11-S61.   Mean Plasma Glucose 235 (*) <117 mg/dL   Comment: Performed at [facility]  GLUCOSE, CAPILLARY     Status: Abnormal   Collection Time    12/25/12  9:11 PM      Result Value Range   Glucose-Capillary 260 (*) 70 - 99 mg/dL   Comment 1 Notify RN    GLUCOSE, CAPILLARY     Status: None   Collection Time    12/26/12  6:27 AM      Result Value Range   Glucose-Capillary 91  70 - 99 mg/dL  GLUCOSE, CAPILLARY     Status: Abnormal   Collection Time    12/26/12 11:30 AM      Result Value Range   Glucose-Capillary 139 (*) 70 - 99 mg/dL   Comment 1 Notify RN    GLUCOSE, CAPILLARY     Status: Abnormal   Collection Time    12/26/12  4:53 PM      Result Value Range   Glucose-Capillary 233 (*) 70 - 99 mg/dL  GLUCOSE, CAPILLARY     Status:  Abnormal   Collection Time    12/26/12  9:38 PM      Result Value  Range   Glucose-Capillary 210 (*) 70 - 99 mg/dL   Comment 1 Documented in Chart    GLUCOSE, CAPILLARY     Status: None   Collection Time    12/27/12  6:21 AM      Result Value Range   Glucose-Capillary 92  70 - 99 mg/dL  GLUCOSE, CAPILLARY     Status: Abnormal   Collection Time    12/27/12 11:21 AM      Result Value Range   Glucose-Capillary 121 (*) 70 - 99 mg/dL   Comment 1 Documented in Chart      Physical Findings: AIMS: Facial and Oral Movements Muscles of Facial Expression: None, normal Lips and Perioral Area: None, normal Jaw: None, normal Tongue: None, normal,Extremity Movements Upper (arms, wrists, hands, fingers): None, normal Lower (legs, knees, ankles, toes): None, normal, Trunk Movements Neck, shoulders, hips: None, normal, Overall Severity Severity of abnormal movements (highest score from questions above): None, normal Incapacitation due to abnormal movements: None, normal Patient's awareness of abnormal movements (rate only patient's report): No Awareness, Dental Status Current problems with teeth and/or dentures?: No Does patient usually wear dentures?: No  CIWA:  CIWA-Ar Total: 0 COWS:     Treatment Plan Summary: Daily contact with patient to assess and evaluate symptoms and progress in treatment Medication management  Plan: 1. Continue crisis management and stabilization. 2. Medication management to reduce current symptoms to base line and improve patient's overall level of functioning no medication changes made today as patient is currently adjusting his current medication;  3. Treat health problems as indicated. 4. Develop treatment plan to decrease risk of relapse upon discharge and the need for     readmission. 5. Psycho-social education regarding relapse prevention and self care. 6. Health care follow up as needed for medical problems. 7. Continue home medications where  appropriate. 8. Will discontinue 1:1 for safety due to contracting for safety  9. Patient will attend groups  10. ELOS: 3-4 days   Medical Decision Making Problem Points:  Established problem, stable/improving (1) Data Points:  Review or order medicine tests (1)  I certify that inpatient services furnished can reasonably be expected to improve the patient's condition.   Payne Garske,JANARDHAHA R. 12/27/2012 4:16 PM

## 2012-12-27 NOTE — Progress Notes (Signed)
[facility] Post 1:1 Observation Documentation  For the first (8) hours following discontinuation of 1:1 precautions, a progress note entry by nursing staff should be documented at least every 2 hours, reflecting the patient's behavior, condition, mood, and conversation.  Use the progress notes for additional entries.  Time 1:1 discontinued:  1540  Patient's Behavior:  Patient resting in bed with eyes closed.  Patient's Condition:  Respirations even and unlabored.  Patient appeared to be in no apparent distress.  Patient's Conversation:  Patient was asleep when writer went into patient's room.  Angeline Slim M 12/27/2012, 11:46 PM

## 2012-12-27 NOTE — Progress Notes (Signed)
Recreation Therapy Notes  Date: 08.25.2014 Time: 3:00pm Location: [address] Dayroom  Group Topic: Stress Management  Goal Area(s) Addresses:  Patient will verbalize importance of using healthy stress management.  Patient will identify stress management technique of choice.  Behavioral Response: Attentive, Appropriate    Intervention: Stress Management  Activity: Activity: Deep Breathing & Progressive Muscle Relaxation. LRT instructed patients on completing Progressive Muscle Relaxation. LRT instructed patients on completing deep belly breathing.   Education:  Pharmacologist, Discharge Planning  Education Outcome: Acknowledges understanding  Clinical Observations/Feedback: Patient actively participated in progressive muscle relaxation, patient was observed to observe peer interaction in deep breathing techniques. Patient expressed no discomfort during either technique. Patient made no contributions to wrap up discussion, but appeared to actively listen as he maintained appropriate eye contact with speaker, as well as nodding in agreement with points of interest.    Jearl Klinefelter, LRT/CTRS  Jearl Klinefelter 12/27/2012 4:09 PM

## 2012-12-27 NOTE — Progress Notes (Signed)
Adult Psychoeducational Group Note  Date:  12/27/2012 Time:  8:28 PM  Group Topic/Focus:  Wrap-Up Group:   The focus of this group is to help patients review their daily goal of treatment and discuss progress on daily workbooks.  Participation Level:  Active  Participation Quality:  Appropriate and Attentive  Affect:  Appropriate  Cognitive:  Alert and Appropriate  Insight: Appropriate  Engagement in Group:  Improving  Modes of Intervention:  Discussion and Support  Additional Comments:  Pt participated in group and shared if he could do anything on a day like today he would love to sky dive. He said he has had a good day.  Reynolds Bowl 12/27/2012, 8:28 PM

## 2012-12-27 NOTE — [facility] Suicide Risk Assessment (Signed)
[facility] Adult Inpatient Family/Significant Other Suicide Prevention Education  Suicide Prevention Education:   Patient Refusal for Family/Significant Other Suicide Prevention Education: The patient has refused to provide written consent for family/significant other to be provided Family/Significant Other Suicide Prevention Education during admission and/or prior to discharge.  Physician notified.  CSW provided suicide prevention information with patient.    The suicide prevention education provided includes the following:  Suicide risk factors  Suicide prevention and interventions  National Suicide Hotline telephone number  [facility] assessment telephone number  [facility] Emergency Assistance 911  [facility] and/or Residential Mobile Crisis Unit telephone number   Reyes Ivan, [place] 12/27/2012 10:37 AM

## 2012-12-27 NOTE — Progress Notes (Signed)
Post 1:1 note- Gilles is out in milieu attending groups and affect and mood are appropriate.  He continues to affirm personal safety by denying SI ti this Clinical research associate.  Support and encouragement offered.  Continue POC and evaluation of treatment goals. Continue 15' checks for safety.

## 2012-12-28 DIAGNOSIS — E1142 Type 2 diabetes mellitus with diabetic polyneuropathy: Secondary | ICD-10-CM | POA: Diagnosis present

## 2012-12-28 DIAGNOSIS — E1140 Type 2 diabetes mellitus with diabetic neuropathy, unspecified: Secondary | ICD-10-CM | POA: Diagnosis present

## 2012-12-28 DIAGNOSIS — M722 Plantar fascial fibromatosis: Secondary | ICD-10-CM | POA: Diagnosis present

## 2012-12-28 LAB — GLUCOSE, CAPILLARY
Glucose-Capillary: 67 mg/dL — ABNORMAL LOW (ref 70–99)
Glucose-Capillary: 118 mg/dL — ABNORMAL HIGH (ref 70–99)
Glucose-Capillary: 142 mg/dL — ABNORMAL HIGH (ref 70–99)

## 2012-12-28 MED ORDER — NICOTINE POLACRILEX 2 MG MT GUM
2.0000 mg | CHEWING_GUM | OROMUCOSAL | Status: DC | PRN
  Administered 2012-12-28 – 2012-12-30 (×2): 2 mg via ORAL

## 2012-12-28 MED ORDER — DULOXETINE HCL 30 MG PO CPEP
90.0000 mg | ORAL_CAPSULE | Freq: Every day | ORAL | Status: DC
  Administered 2012-12-28: 60 mg via ORAL
  Administered 2012-12-29: 90 mg via ORAL
  Filled 2012-12-28 (×4): qty 3

## 2012-12-28 NOTE — Progress Notes (Signed)
Patient ID: Eric Shaw, male   DOB: [birth year], 52 y.o.   MRN: [id number] [facility] MD Progress Note  12/28/2012 1:55 PM Eric Shaw  MRN:  [id number]  Subjective: Patient complained of feeling depressed and suicidal but contracts for safety in the hospital. Patient is ruminated about his psychosocial stresses and has no plans how to face them when discharged. "Im having a rough day today." States he can't say why he is feeling more depressed today. He is tearful and presents with profound depressed affect. He reports his depression is a 8/10 and his anxiety is a 5-6/10. He still has suicidal ideation but does contract for safety.  Diagnosis:  MDD severe with suicide attempt DSM5: Schizophrenia Disorders:  NA Obsessive-Compulsive Disorders:  NA Trauma-Stressor Disorders:  NA Substance/Addictive Disorders:  NA  Depressive Disorders:  MDD severe recurrent with suicide attempt  ADL's:  Impaired  Sleep: Poor  Appetite:  Fair  Suicidal Ideation:  Yes, with intent, no plan can not contract for safety at this time. Homicidal Ideation:  denies AEB (as evidenced by):  Psychiatric Specialty Exam: Review of Systems  Constitutional: Negative.  Negative for fever, chills, weight loss, malaise/fatigue and diaphoresis.  HENT: Negative for congestion and sore throat.   Eyes: Negative for blurred vision, double vision and photophobia.  Respiratory: Negative for cough, shortness of breath and wheezing.   Cardiovascular: Negative for chest pain, palpitations and PND.  Gastrointestinal: Negative for heartburn, nausea, vomiting, abdominal pain, diarrhea and constipation.  Musculoskeletal: Negative for myalgias, joint pain and falls.  Neurological: Negative for dizziness, tingling, tremors, sensory change, speech change, focal weakness, seizures, loss of consciousness, weakness and headaches.  Endo/Heme/Allergies: Negative for polydipsia. Does not bruise/bleed easily.  Psychiatric/Behavioral:  Negative for depression, suicidal ideas, hallucinations, memory loss and substance abuse. The patient is not nervous/anxious and does not have insomnia.     Blood pressure 165/89, pulse 106, temperature 98.6 F (37 C), temperature source Oral, resp. rate 17, height 5' 11.5" (1.816 m), weight 102.059 kg (225 lb), SpO2 99.00%.Body mass index is 30.95 kg/(m^2).  General Appearance: Disheveled  Eye Solicitor::  Fair  Speech:  Clear and Coherent  Volume:  Decreased  Mood:  Depressed  Affect:  Tearful and sobbing  Thought Process:  Goal Directed  Orientation:  Full (Time, Place, and Person)  Thought Content:  WDL  Suicidal Thoughts:  Yes with intent, no plan  Homicidal Thoughts:  No  Memory:  Immediate;   Fair  Judgement:  Impaired  Insight:  Lacking  Psychomotor Activity:  Decreased  Concentration:  Fair  Recall:  Fair  Akathisia:  No  Handed:  Right  AIMS (if indicated):     Assets:  Communication Skills  Sleep:  Number of Hours: 4.5   Current Medications: Current Facility-Administered Medications  Medication Dose Route Frequency Provider Last Rate Last Dose  . acetaminophen (TYLENOL) tablet 650 mg  650 mg Oral Q6H PRN Court Joy, PA-C   650 mg at 12/25/12 0941  . alum & mag hydroxide-simeth (MAALOX/MYLANTA) 200-200-20 MG/5ML suspension 30 mL  30 mL Oral Q4H PRN Court Joy, PA-C      . DULoxetine (CYMBALTA) DR capsule 30 mg  30 mg Oral BID Verne Spurr, PA-C   30 mg at 12/28/12 0753  . gabapentin (NEURONTIN) capsule 300 mg  300 mg Oral TID Verne Spurr, PA-C   300 mg at 12/28/12 1206  . ibuprofen (ADVIL,MOTRIN) tablet 800 mg  800 mg Oral Q8H PRN Rachael Fee, MD  800 mg at 12/26/12 0925  . insulin aspart (novoLOG) injection 0-15 Units  0-15 Units Subcutaneous TID WC Verne Spurr, PA-C   2 Units at 12/28/12 1204  . insulin aspart (novoLOG) injection 0-5 Units  0-5 Units Subcutaneous QHS Verne Spurr, PA-C   2 Units at 12/26/12 2155  . insulin glargine (LANTUS) injection  55 Units  55 Units Subcutaneous BID Verne Spurr, PA-C   55 Units at 12/28/12 0756  . lisinopril (PRINIVIL,ZESTRIL) tablet 10 mg  10 mg Oral Daily Verne Spurr, PA-C   10 mg at 12/28/12 0754  . magnesium hydroxide (MILK OF MAGNESIA) suspension 30 mL  30 mL Oral Daily PRN Court Joy, PA-C        Lab Results:  Results for orders placed during the hospital encounter of 12/25/12 (from the past 48 hour(s))  GLUCOSE, CAPILLARY     Status: Abnormal   Collection Time    12/26/12  4:53 PM      Result Value Range   Glucose-Capillary 233 (*) 70 - 99 mg/dL  GLUCOSE, CAPILLARY     Status: Abnormal   Collection Time    12/26/12  9:38 PM      Result Value Range   Glucose-Capillary 210 (*) 70 - 99 mg/dL   Comment 1 Documented in Chart    GLUCOSE, CAPILLARY     Status: None   Collection Time    12/27/12  6:21 AM      Result Value Range   Glucose-Capillary 92  70 - 99 mg/dL  GLUCOSE, CAPILLARY     Status: Abnormal   Collection Time    12/27/12 11:21 AM      Result Value Range   Glucose-Capillary 121 (*) 70 - 99 mg/dL   Comment 1 Documented in Chart    GLUCOSE, CAPILLARY     Status: Abnormal   Collection Time    12/27/12  4:56 PM      Result Value Range   Glucose-Capillary 140 (*) 70 - 99 mg/dL  GLUCOSE, CAPILLARY     Status: Abnormal   Collection Time    12/27/12  8:55 PM      Result Value Range   Glucose-Capillary 184 (*) 70 - 99 mg/dL  GLUCOSE, CAPILLARY     Status: Abnormal   Collection Time    12/28/12  6:26 AM      Result Value Range   Glucose-Capillary 67 (*) 70 - 99 mg/dL  GLUCOSE, CAPILLARY     Status: Abnormal   Collection Time    12/28/12  7:36 AM      Result Value Range   Glucose-Capillary 118 (*) 70 - 99 mg/dL  GLUCOSE, CAPILLARY     Status: Abnormal   Collection Time    12/28/12 11:53 AM      Result Value Range   Glucose-Capillary 132 (*) 70 - 99 mg/dL    Physical Findings: AIMS: Facial and Oral Movements Muscles of Facial Expression: None, normal Lips and  Perioral Area: None, normal Jaw: None, normal Tongue: None, normal,Extremity Movements Upper (arms, wrists, hands, fingers): None, normal Lower (legs, knees, ankles, toes): None, normal, Trunk Movements Neck, shoulders, hips: None, normal, Overall Severity Severity of abnormal movements (highest score from questions above): None, normal Incapacitation due to abnormal movements: None, normal Patient's awareness of abnormal movements (rate only patient's report): No Awareness, Dental Status Current problems with teeth and/or dentures?: No Does patient usually wear dentures?: No  CIWA:  CIWA-Ar Total: 0 COWS:  Treatment Plan Summary: Daily contact with patient to assess and evaluate symptoms and progress in treatment Medication management  Plan: 1. Will increase cymbalta to 60 mg po qd for better symptom control  2. Will also call for diabetic consult. 3. Will continue current plan of care as written at this time. 4. ELOS: 2-4 days. 5. Will refer to [facility] Transitional care team for psychosocial support, person for assistance with housing, disability, access to medications etc.  Medical Decision Making Problem Points:  Established problem, worsening (2) and New problem, with additional work-up planned (4) Data Points:  Review of new medications or change in dosage (2)  I certify that inpatient services furnished can reasonably be expected to improve the patient's condition.   Rona Ravens. Mashburn RPAC 1:59 PM 12/28/2012  Reviewed the information documented and agree with the treatment plan.  Gerlene Glassburn,JANARDHAHA R. 12/28/2012 9:41 PM

## 2012-12-28 NOTE — Progress Notes (Signed)
D:  Patient up and present in the milieu this shift.  Has been attending and participating in groups.  Affect is flat/blunted.  He rates his depression at 4 and hopelessness at 5 today.  He states he still has some off and on thoughts of self harm, but agrees to seek out staff if feeling unsafe on the unit.  He reports sleep and appetite are both fair.  A:  Medications given as prescribed.  Encouraged patient to participate in all groups.  Also encouraged patient to come to me if he was having any thoughts of suicide or self harm.  Offered support and encouragement.   R:  Patient agrees to seek out staff if feeling unsafe.  Tolerating medications well.  Interacting well with staff and peers.

## 2012-12-28 NOTE — [facility] Group Notes (Addendum)
[facility] LCSW Group Therapy      Feelings About Diagnosis 1:15 - 2:30 PM         12/28/2012  2:58 PM    Type of Therapy:  Group Therapy  Participation Level:  Active  Participation Quality:  Appropriate  Affect:  Appropriate  Cognitive:  Alert and Appropriate  Insight:  Developing/Improving and Engaged  Engagement in Therapy:  Developing/Improving and Engaged  Modes of Intervention:  Discussion, Education, Exploration, Problem-Solving, Rapport Building, Support  Summary of Progress/Problems:  Patient actively participated in group. Patient discussed past and present diagnosis and the effects it has had on  life.  Patient talked about family and society being judgmental and the stigma associated with having a mental health diagnosis.   He also shared that family would feel weak and inadequate because he had failed at his suicide attempt.  Wynn Banker 12/28/2012  2:58 PM

## 2012-12-28 NOTE — Progress Notes (Signed)
The focus of this group is to educate the patient on the purpose and policies of crisis stabilization and provide a format to answer questions about their admission.  The group details unit policies and expectations of patients while admitted.  Patient attended 0900 nurse education orientation group this morning.  Patient actively participated, appropriate affect, alert, appropriate insight and engagement.  Patient will work on discharge goals today.  

## 2012-12-28 NOTE — Progress Notes (Signed)
D: Patient resting in bed with eyes closed.  Respirations even and unlabored.  Patient appears to be in no apparent distress. A: Staff to monitor Q 15 mins for safety.   R:Patient remains safe on the unit.  

## 2012-12-28 NOTE — Progress Notes (Signed)
Patient ID: Eric Shaw, male   DOB: 1961/01/06, 52 y.o.   MRN: [id number]  No s/s of distress at this time. Pt resting in bed with eyes closed.

## 2012-12-28 NOTE — Progress Notes (Signed)
Adult Psychoeducational Group Note  Date:  12/28/2012 Time:  11:07 PM  Group Topic/Focus:  Goals Group:   The focus of this group is to help patients establish daily goals to achieve during treatment and discuss how the patient can incorporate goal setting into their daily lives to aide in recovery.  Participation Level:  Active  Participation Quality:  Appropriate  Affect:  Appropriate  Cognitive:  Appropriate  Insight: Appropriate  Engagement in Group:  Engaged  Modes of Intervention:  Discussion  Additional Comments:  Pt stated that she had a good day, enjoyed the others groups went well, and that's what made his day go well.   Terie Purser R 12/28/2012, 11:07 PM

## 2012-12-28 NOTE — Progress Notes (Signed)
Adult Psychoeducational Group Note  Date:  12/28/2012 Time:  12:15 PM  Group Topic/Focus:  Recovery Goals:   The focus of this group is to identify appropriate goals for recovery and establish a plan to achieve them.  Participation Level:  Minimal  Participation Quality:  Appropriate and Attentive  Affect:  Flat  Cognitive:  Alert and Appropriate  Insight: Appropriate  Engagement in Group:  Engaged  Modes of Intervention:  Discussion, Socialization and Support  Additional Comments:  Pt came to group and shared that pain and pride were standing between him and recovery. Pt plans on changing this by staying on medication for pain management and reaching out more to friends and family to ask for help.  Cathlean Cower 12/28/2012, 12:15 PM

## 2012-12-28 NOTE — Progress Notes (Signed)
Recreation Therapy Notes  Date: 08.26.2014 Time: 2:45pm Location: [address] Dayroom  Group Topic: Animal Assisted Activities  Goal Area(s) Addresses:  Patient will interact appropriately with dog team.    Behavioral Response: Appropriate, Attentive, Engaged  Education: Coping Skill   Education Outcome: Acknowledges understanding  Clinical Observations/Feedback: Dog Team: Ellis & handler. Patient interacted appropriately with peer, dog team and LRT.    Jayliah Benett L Awanda Wilcock, LRT/CTRS  Tashena Ibach L 12/28/2012 4:49 PM 

## 2012-12-29 DIAGNOSIS — F322 Major depressive disorder, single episode, severe without psychotic features: Secondary | ICD-10-CM

## 2012-12-29 LAB — GLUCOSE, CAPILLARY
Glucose-Capillary: 162 mg/dL — ABNORMAL HIGH (ref 70–99)
Glucose-Capillary: 125 mg/dL — ABNORMAL HIGH (ref 70–99)
Glucose-Capillary: 103 mg/dL — ABNORMAL HIGH (ref 70–99)
Glucose-Capillary: 108 mg/dL — ABNORMAL HIGH (ref 70–99)
Glucose-Capillary: 79 mg/dL (ref 70–99)

## 2012-12-29 MED ORDER — GABAPENTIN 400 MG PO CAPS
400.0000 mg | ORAL_CAPSULE | Freq: Three times a day (TID) | ORAL | Status: DC
  Administered 2012-12-29 – 2012-12-31 (×6): 400 mg via ORAL
  Filled 2012-12-29: qty 42
  Filled 2012-12-29: qty 1
  Filled 2012-12-29: qty 42
  Filled 2012-12-29: qty 1
  Filled 2012-12-29: qty 42
  Filled 2012-12-29 (×6): qty 1

## 2012-12-29 MED ORDER — DULOXETINE HCL 60 MG PO CPEP
60.0000 mg | ORAL_CAPSULE | Freq: Every day | ORAL | Status: DC
  Administered 2012-12-30 – 2012-12-31 (×2): 60 mg via ORAL
  Filled 2012-12-29: qty 14
  Filled 2012-12-29 (×3): qty 1

## 2012-12-29 NOTE — Tx Team (Signed)
Interdisciplinary Treatment Plan Update (Adult)  Date: 12/29/2012  Time Reviewed:  9:45 AM  Progress in Treatment: Attending groups: Yes Participating in groups:  Yes Taking medication as prescribed:  Yes Tolerating medication:  Yes Family/Significant othe contact made: No, pt refused Patient understands diagnosis:  Yes Discussing patient identified problems/goals with staff:  Yes Medical problems stabilized or resolved:  Yes Denies suicidal/homicidal ideation: Yes Issues/concerns per patient self-inventory:  Yes Other:  New problem(s) identified: N/A  Discharge Plan or Barriers: Pt will follow up at [facility] for medication management and therapy. Pt has also met with Cathleen Fears, homeless liaison for housing support upon d/c and transitional team through [facility]  Reason for Continuation of Hospitalization: Anxiety Depression Medication Stabilization  Comments: Med adjustments today. Pt continues to have labile mood  Estimated length of stay: 3-5 days  For review of initial/current patient goals, please see plan of care.  Attendees: Patient:     Family:     Physician:  Dr. Javier Glazier 12/29/2012 10:37 AM   Nursing:   Harold Barban, RN 12/29/2012 10:37 AM   Clinical Social Worker:  Reyes Ivan, LCSWA 12/29/2012 10:37 AM   Other: Verne Spurr, PA 12/29/2012 10:37 AM   Other:  Frankey Shown, MA care coordination 12/29/2012 10:37 AM   Other:  Juline Patch, LCSW 12/29/2012 10:37 AM   Other:  Onnie Boer, RN 12/29/2012 10:37 AM   Other:    Other:    Other:    Other:    Other:    Other:     Scribe for Treatment Team:   Carmina Miller, 12/29/2012 10:37 AM

## 2012-12-29 NOTE — Progress Notes (Signed)
Inpatient Diabetes Program Recommendations  AACE/ADA: New Consensus Statement on Inpatient Glycemic Control (2013)  Target Ranges:  Prepandial:   less than 140 mg/dL      Peak postprandial:   less than 180 mg/dL (1-2 hours)      Critically ill patients:  140 - 180 mg/dL   Reason for Visit: Elevated HgbA1C and Hyperglycemia  Patient presented to the ED via ambulance after being found by a maintenance man. He reports taking an overdose of 40 Benadryl tablets in a suicide attempt. He was evaluated and felt to be in need of acute psychiatric hospitalization for stabilization and crisis management.  Pt on Lantus 55 units bid at home.  No bolus insulin.  Weight 102 kg. Blood sugars uncontrolled at home - 9.8% HgbA1C   Results for [name], [name] (MRN [id number]) as of 12/29/2012 08:36  Ref. Range 12/24/2012 11:54  Sodium Latest Range: 135-145 mEq/L 136  Potassium Latest Range: 3.5-5.1 mEq/L 3.0 (L)  Chloride Latest Range: 96-112 mEq/L 100  CO2 Latest Range: 19-32 mEq/L 23  BUN Latest Range: 6-23 mg/dL 16  Creatinine Latest Range: 0.50-1.35 mg/dL 5.78  Calcium Latest Range: 8.4-10.5 mg/dL 46.9  GFR calc non Af Amer Latest Range: >90 mL/min >90  GFR calc Af Amer Latest Range: >90 mL/min >90  Glucose Latest Range: 70-99 mg/dL 72  Results for [name], [name] (MRN [id number]) as of 12/29/2012 08:36  Ref. Range 12/25/2012 19:27  Hemoglobin A1C Latest Range: <5.7 % 9.8 (H)  Results for [name], [name] (MRN [id number]) as of 12/29/2012 08:36  Ref. Range 12/28/2012 07:36 12/28/2012 11:53 12/28/2012 17:11 12/28/2012 21:21 12/29/2012 06:06  Glucose-Capillary Latest Range: 70-99 mg/dL 536 (H) 644 (H) 034 (H) 142 (H) 88   Blood sugars fairly well controlled as inpatient. Appears to have too much basal on board. (102kg x .5 = 51 units TDD)   Inpatient Diabetes Program Recommendations Insulin - Basal: Decrease Lantus to 45 units bid Insulin - Meal Coverage: Add meal coverage insulin - Novolog 6 units tidwc if pt  eats >50% meal HgbA1C: 9.8% - uncontrolled Outpatient Referral: Would benefit from OP Diabetes Education Diet: Encourage pt to make healthy choices using portion control in the cafeteria  Would benefit from 50% basal, 50% bolus insulin rather than high dose of basal.   Pt will need PCP to manage DM. ? Blood glucose monitoring at home. Take logbook to MD for adjustments in insulin regimen.  Will follow while inpatient.  Thank you. Ailene Ards, RD, LDN, CDE Inpatient Diabetes Coordinator [phone]

## 2012-12-29 NOTE — [facility] Group Notes (Signed)
[facility] LCSW Group Therapy  12/29/2012  1:15 PM   Type of Therapy:  Group Therapy  Participation Level:  Active  Participation Quality:  Appropriate and Attentive  Affect:  Appropriate, Flat and Depressed  Cognitive:  Alert and Appropriate  Insight:  Developing/Improving and Engaged  Engagement in Therapy:  Developing/Improving and Engaged  Modes of Intervention:  Clarification, Confrontation, Discussion, Education, Exploration, Limit-setting, Orientation, Problem-solving, Rapport Building, Dance movement psychotherapist, Socialization and Support  Summary of Progress/Problems: The topic for group today was emotional regulation.  This group focused on both positive and negative emotion identification and allowed group members to process ways to identify feelings, regulate negative emotions, and find healthy ways to manage internal/external emotions. Group members were asked to reflect on a time when their reaction to an emotion led to a negative outcome and explored how alternative responses using emotion regulation would have benefited them. Group members were also asked to discuss a time when emotion regulation was utilized when a negative emotion was experienced. Pt shared that he has always been taught to "suck it up" and not show emotions, which caused him to finally break.  Pt states that he feels he purposely crashed his life by losing everything.  CSW notes that this is the first time pt discussed this.  Pt states that he feels he is closer to overcoming his depression and SI.  Pt actively participated and was engaged in group discussion.    Eric Shaw, [place] 12/29/2012 2:53 PM

## 2012-12-29 NOTE — Progress Notes (Signed)
Patient ID: Eric Shaw, male   DOB: [birth year], 52 y.o.   MRN: [id number]  [facility] MD Progress Note  12/29/2012 2:51 PM Eric Shaw  MRN:  [id number]  Subjective: Patient has increased blood pressure and feeling dizzy and wobbly when walking. He has feeling better and rated as depressed 3/10, anxiety 4/10 and denied suicidal thoughts at the time evaluation. He contracts for safety in the hospital. Patient is ruminated about his psychosocial stresses and has no plans how to face them when discharged. He has no dysphoric movements and feels some what better. He wants to try to live without depression and starting feeling some hope and help by talking with people from here and a lady from [place],   Diagnosis:  MDD severe with suicide attempt DSM5: Schizophrenia Disorders:  NA Obsessive-Compulsive Disorders:  NA Trauma-Stressor Disorders:  NA Substance/Addictive Disorders:  NA  Depressive Disorders:  MDD severe recurrent with suicide attempt  ADL's:  Impaired  Sleep: Poor  Appetite:  Fair  Suicidal Ideation:  Yes, with intent, no plan can not contract for safety at this time. Homicidal Ideation:  denies AEB (as evidenced by):  Psychiatric Specialty Exam: Review of Systems  Constitutional: Negative.  Negative for fever, chills, weight loss, malaise/fatigue and diaphoresis.  HENT: Negative for congestion and sore throat.   Eyes: Negative for blurred vision, double vision and photophobia.  Respiratory: Negative for cough, shortness of breath and wheezing.   Cardiovascular: Negative for chest pain, palpitations and PND.  Gastrointestinal: Negative for heartburn, nausea, vomiting, abdominal pain, diarrhea and constipation.  Musculoskeletal: Negative for myalgias, joint pain and falls.  Neurological: Negative for dizziness, tingling, tremors, sensory change, speech change, focal weakness, seizures, loss of consciousness, weakness and headaches.  Endo/Heme/Allergies: Negative for  polydipsia. Does not bruise/bleed easily.  Psychiatric/Behavioral: Negative for depression, suicidal ideas, hallucinations, memory loss and substance abuse. The patient is not nervous/anxious and does not have insomnia.     Blood pressure 127/81, pulse 106, temperature 97.7 F (36.5 C), temperature source Oral, resp. rate 20, height 5' 11.5" (1.816 m), weight 102.059 kg (225 lb), SpO2 99.00%.Body mass index is 30.95 kg/(m^2).  General Appearance: Disheveled  Eye Solicitor::  Fair  Speech:  Clear and Coherent  Volume:  Decreased  Mood:  Depressed  Affect:  Tearful and sobbing  Thought Process:  Goal Directed  Orientation:  Full (Time, Place, and Person)  Thought Content:  WDL  Suicidal Thoughts:  Yes with intent, no plan  Homicidal Thoughts:  No  Memory:  Immediate;   Fair  Judgement:  Impaired  Insight:  Lacking  Psychomotor Activity:  Decreased  Concentration:  Fair  Recall:  Fair  Akathisia:  No  Handed:  Right  AIMS (if indicated):     Assets:  Communication Skills  Sleep:  Number of Hours: 4.75   Current Medications: Current Facility-Administered Medications  Medication Dose Route Frequency Provider Last Rate Last Dose  . acetaminophen (TYLENOL) tablet 650 mg  650 mg Oral Q6H PRN Court Joy, PA-C   650 mg at 12/25/12 0941  . alum & mag hydroxide-simeth (MAALOX/MYLANTA) 200-200-20 MG/5ML suspension 30 mL  30 mL Oral Q4H PRN Court Joy, PA-C      . DULoxetine (CYMBALTA) DR capsule 90 mg  90 mg Oral Daily Verne Spurr, PA-C   90 mg at 12/29/12 7829  . gabapentin (NEURONTIN) capsule 300 mg  300 mg Oral TID Verne Spurr, PA-C   300 mg at 12/29/12 1210  . ibuprofen (ADVIL,MOTRIN) tablet 800  mg  800 mg Oral Q8H PRN Rachael Fee, MD   800 mg at 12/26/12 1610  . insulin aspart (novoLOG) injection 0-15 Units  0-15 Units Subcutaneous TID WC Verne Spurr, PA-C   3 Units at 12/29/12 1213  . insulin aspart (novoLOG) injection 0-5 Units  0-5 Units Subcutaneous QHS Verne Spurr,  PA-C   2 Units at 12/26/12 2155  . insulin glargine (LANTUS) injection 55 Units  55 Units Subcutaneous BID Verne Spurr, PA-C   55 Units at 12/29/12 0810  . lisinopril (PRINIVIL,ZESTRIL) tablet 10 mg  10 mg Oral Daily Verne Spurr, PA-C   10 mg at 12/29/12 0806  . magnesium hydroxide (MILK OF MAGNESIA) suspension 30 mL  30 mL Oral Daily PRN Court Joy, PA-C      . nicotine polacrilex (NICORETTE) gum 2 mg  2 mg Oral PRN Nehemiah Settle, MD   2 mg at 12/28/12 1436    Lab Results:  Results for orders placed during the hospital encounter of 12/25/12 (from the past 48 hour(s))  GLUCOSE, CAPILLARY     Status: Abnormal   Collection Time    12/26/12  4:53 PM      Result Value Range   Glucose-Capillary 233 (*) 70 - 99 mg/dL  GLUCOSE, CAPILLARY     Status: Abnormal   Collection Time    12/26/12  9:38 PM      Result Value Range   Glucose-Capillary 210 (*) 70 - 99 mg/dL   Comment 1 Documented in Chart    GLUCOSE, CAPILLARY     Status: None   Collection Time    12/27/12  6:21 AM      Result Value Range   Glucose-Capillary 92  70 - 99 mg/dL  GLUCOSE, CAPILLARY     Status: Abnormal   Collection Time    12/27/12 11:21 AM      Result Value Range   Glucose-Capillary 121 (*) 70 - 99 mg/dL   Comment 1 Documented in Chart    GLUCOSE, CAPILLARY     Status: Abnormal   Collection Time    12/27/12  4:56 PM      Result Value Range   Glucose-Capillary 140 (*) 70 - 99 mg/dL  GLUCOSE, CAPILLARY     Status: Abnormal   Collection Time    12/27/12  8:55 PM      Result Value Range   Glucose-Capillary 184 (*) 70 - 99 mg/dL  GLUCOSE, CAPILLARY     Status: Abnormal   Collection Time    12/28/12  6:26 AM      Result Value Range   Glucose-Capillary 67 (*) 70 - 99 mg/dL  GLUCOSE, CAPILLARY     Status: Abnormal   Collection Time    12/28/12  7:36 AM      Result Value Range   Glucose-Capillary 118 (*) 70 - 99 mg/dL  GLUCOSE, CAPILLARY     Status: Abnormal   Collection Time    12/28/12  11:53 AM      Result Value Range   Glucose-Capillary 132 (*) 70 - 99 mg/dL    Physical Findings: AIMS: Facial and Oral Movements Muscles of Facial Expression: None, normal Lips and Perioral Area: None, normal Jaw: None, normal Tongue: None, normal,Extremity Movements Upper (arms, wrists, hands, fingers): None, normal Lower (legs, knees, ankles, toes): None, normal, Trunk Movements Neck, shoulders, hips: None, normal, Overall Severity Severity of abnormal movements (highest score from questions above): None, normal Incapacitation due to abnormal movements: None, normal  Patient's awareness of abnormal movements (rate only patient's report): No Awareness, Dental Status Current problems with teeth and/or dentures?: No Does patient usually wear dentures?: No  CIWA:  CIWA-Ar Total: 0 COWS:     Treatment Plan Summary: Daily contact with patient to assess and evaluate symptoms and progress in treatment Medication management  Plan: 1. Continue Cymbalta to 60 mg po qd for better symptom control  2. Increase Neurontin 400 mg TID 3. Continue current plan of care as written at this time. 4. ELOS: 2-4 days. 5. Will refer to [facility] Transitional care team for psychosocial support, person for assistance with housing, disability, access to medications etc.  Medical Decision Making Problem Points:  Established problem, worsening (2) and New problem, with additional work-up planned (4) Data Points:  Review of new medications or change in dosage (2)  I certify that inpatient services furnished can reasonably be expected to improve the patient's condition.    Nehemiah Settle., MD 12/29/2012 2:51 PM

## 2012-12-29 NOTE — Progress Notes (Signed)
D: Patient pleasant and cooperative with staff and peers. Patient's affect is appropriate to circumstance and mood is anxious. He reported on the self inventory sheet that he slept well, appetite and ability to pay attention are both good and energy level is normal. Patient rated depression and feelings of hopelessness "3". He voiced that he feels a whole lot better today as opposed to yesterday. Writer observed that patient is interactive with peers in the milieu, engaged in groups and smiling/laughing with others on the unit.  A: Support and encouragement provided to patient. Scheduled medications administered per MD orders. Maintain Q15 minute checks for safety.  R: Patient receptive. Passive SI, but contracts for safety. Denies HI and AVH. Patient remains safe.

## 2012-12-29 NOTE — Progress Notes (Signed)
Adult Psychoeducational Group Note  Date:  12/29/2012 Time:  10:53 AM  Group Topic/Focus:  Personal Choices and Values:   The focus of this group is to help patients assess and explore the importance of values in their lives, how their values affect their decisions, how they express their values and what opposes their expression.  Participation Level:  Active  Participation Quality:  Appropriate  Affect:  Appropriate  Cognitive:  Alert and Appropriate  Insight: Appropriate  Engagement in Group:  Engaged  Modes of Intervention:  Discussion  Additional Comments:  Today's group was a therapeutic activity. Pt attended group and was very engaged.    Reygan Heagle K 12/29/2012, 10:53 AM 

## 2012-12-29 NOTE — Progress Notes (Signed)
Recreation Therapy Notes  Date: 08.27.2014 Time: 3:00pm Location: [address] Dayroom  Group Topic: Leisure Education  Goal Area(s) Addresses:  Patient will verbalize activity of interest by end of group session. Patient will verbalize the ability to use positive leisure/recreation as a coping mechanism.  Behavioral Response: Bizarre Affect, Sleeping,  Intervention: Informational Worksheet  Activity: Leisure Time Management. Patients were given an worksheet outlining approximately 16 hours of the day, using various colors patients were asked to identify how they use their time. For example red = time at work, blue = time for self-care.   Education:  Discharge Planning.   Education Outcome: Acknowledges understanding  Clinical Observations/Feedback: Patient attended group session, however he did not engage in activity. Patient presented with flat affect. Patient was moving very slowly as he entered group session. Patient sat in chair near windows in day room and leaned head back, falling asleep. At approximately 3:15pm patient awoke, at this time patient affect changed from flat to bizarre, as patient seemed to be disconnected from situation. Patient presented with eyes bugged and rigid body movements, as well as being unable or unwilling to lift feet from floor, as he shuffled when he walked. LRT approached patient to assist him with leaving group session, LRT stated patient name, patient did not respond to LRT and made no eye contact with LRT. LRT assisted patient to the door, where she was met by MHT. Patient then stated he wanted to lay down in his room. MHT took care of patient at this time.   Marykay Lex Magaline Steinberg, LRT/CTRS  Jearl Klinefelter 12/29/2012 4:15 PM

## 2012-12-29 NOTE — [facility] Group Notes (Signed)
[facility] LCSW Aftercare Discharge Planning Group Note  12/29/2012  8:45 AM  Participation Quality:  Alert and Appropriate   Mood/Affect:  Appropriate and Bright  Depression Rating:  3  Anxiety Rating:  3  Thoughts of Suicide:  Pt denies HI, reports passive SI  Will you contract for safety?   Yes  Current AVH:  Pt denies  Plan for Discharge/Comments:  Pt attended discharge planning group and actively participated in group.  CSW provided pt with today's workbook.  Pt reports feeling much better today, stating he is "extremely happy".  Pt explained that he opened up in groups yesterday and was able to relate to others and realizes he is not alone.  Pt states that he is more hopeful today.  Pt states that he wakes up with SI every morning but decides to not act on it daily.  Pt met with Cathleen Fears, homeless liaison yesterday and dicussed housing options upon d/c.  Pt is open to staying at the shelter in [place].  Pt will follow up at [facility] for medication management and therapy.  No further needs voiced by pt at this time.    Transportation Means: Pt reports access to transportation  Supports: No supports mentioned at this time  Eric Shaw, LCSWA 12/29/2012 10:08 AM

## 2012-12-29 NOTE — Progress Notes (Signed)
Adult Psychoeducational Group Note  Date:  12/29/2012 Time:  11:00AM Group Topic/Focus:  Personal Deverlopment  Participation Level:  Active  Participation Quality:  Appropriate and Attentive  Affect:  Appropriate  Cognitive:  Alert and Appropriate  Insight: Appropriate  Engagement in Group:  Engaged  Modes of Intervention:  Discussion  Additional Comments:  Pt. Was attentive and appropriate during today's group discussion. Pt. Was able to participate in group on values. Pt was able to communicate on is the way you are living now in line with those values. Pt shared that he is going to improve friendship and love.   Bing Plume D 12/29/2012, 11:33 AM

## 2012-12-29 NOTE — Progress Notes (Signed)
Agree with assessment and plan Eric Shaw A. Eric Shaw, M.D. 

## 2012-12-29 NOTE — Progress Notes (Signed)
Adult Psychoeducational Group Note  Date:  12/29/2012 Time:  8:59 PM  Group Topic/Focus:  Wrap-Up Group:   The focus of this group is to help patients review their daily goal of treatment and discuss progress on daily workbooks.  Participation Level:  Active  Participation Quality:  Appropriate  Affect:  Appropriate  Cognitive:  Alert  Insight: Appropriate  Engagement in Group:  Engaged  Modes of Intervention:  Discussion  Additional Comments:  Pt stated that he had a pretty good day even though he had some physically problems for the past few hours. Pt stated that he also discovered that he can love again, and thought that was something that he had lost. He now has hopes that he can love himself again some day. He also stated that he was not very pleased with the interaction that he had with the dr.  Kaleen Odea R 12/29/2012, 8:59 PM

## 2012-12-30 LAB — GLUCOSE, CAPILLARY
Glucose-Capillary: 75 mg/dL (ref 70–99)
Glucose-Capillary: 167 mg/dL — ABNORMAL HIGH (ref 70–99)

## 2012-12-30 NOTE — Progress Notes (Signed)
Adult Psychoeducational Group Note  Date:  12/30/2012 Time:  9:00 AM  Group Topic/Focus:  Rediscovering Joy:   The focus of this group is to explore various ways to relieve stress in a positive manner.  Participation Level:  Active  Participation Quality:  Appropriate and Attentive  Affect:  Appropriate  Cognitive:  Alert and Oriented  Insight: Appropriate  Engagement in Group:  Engaged  Modes of Intervention:  Discussion  Additional Comments:  Pt. participated in warm-up exercises at the beginning of group.  Harold Barban E 12/30/2012, 10:08 AM

## 2012-12-30 NOTE — Progress Notes (Signed)
Patient ID: Eric Shaw, male   DOB: 1960-08-25, 52 y.o.   MRN: [id number]  [facility] MD Progress Note  12/30/2012 2:06 PM Eric Shaw  MRN:  [id number]  Subjective: Patient states he is having a rough day due to not knowing what his discharge status is, and not being clear on where he is going. He now knows that he is going to [employer] and feels better about his options for follow up. He states the groups have helped and he feels much better about leaving tomorrow. He states having a set plan is helpful.   He rates his depression at a 3/10, which is down from the 12/10 when he arrived and his anxiety is 8-9/10 which he feels is due to the coming changes in his life. Diagnosis:  MDD severe with suicide attempt Depressive Disorders:  MDD severe recurrent with suicide attempt  ADL's:  Impaired  Sleep: not too well Appetite:  Fair  Suicidal Ideation:  Denies Homicidal Ideation:  denies AEB (as evidenced by):  Psychiatric Specialty Exam: Review of Systems  Constitutional: Negative.  Negative for fever, chills, weight loss, malaise/fatigue and diaphoresis.  HENT: Negative for congestion and sore throat.   Eyes: Negative for blurred vision, double vision and photophobia.  Respiratory: Negative for cough, shortness of breath and wheezing.   Cardiovascular: Negative for chest pain, palpitations and PND.  Gastrointestinal: Negative for heartburn, nausea, vomiting, abdominal pain, diarrhea and constipation.  Musculoskeletal: Negative for myalgias, joint pain and falls.  Neurological: Negative for dizziness, tingling, tremors, sensory change, speech change, focal weakness, seizures, loss of consciousness, weakness and headaches.  Endo/Heme/Allergies: Negative for polydipsia. Does not bruise/bleed easily.  Psychiatric/Behavioral: Negative for depression, suicidal ideas, hallucinations, memory loss and substance abuse. The patient is not nervous/anxious and does not have insomnia.     Blood  pressure 135/88, pulse 109, temperature 99.3 F (37.4 C), temperature source Oral, resp. rate 16, height 5' 11.5" (1.816 m), weight 102.059 kg (225 lb), SpO2 99.00%.Body mass index is 30.95 kg/(m^2).  General Appearance: fairly groomed  Patent attorney::  Fair  Speech:  Clear and Coherent  Volume:  normal  Mood:  euthymic  Affect:  congruent  Thought Process:  Goal Directed  Orientation:  Full (Time, Place, and Person)  Thought Content:  WDL  Suicidal Thoughts:  denies  Homicidal Thoughts:  No  Memory:  Immediate;   Fair  Judgement:  fair  Insight:  present  Psychomotor Activity:  Decreased  Concentration:  Fair  Recall:  Fair  Akathisia:  No  Handed:  Right  AIMS (if indicated):     Assets:  Communication Skills  Sleep:  Number of Hours: 4.75   Current Medications: Current Facility-Administered Medications  Medication Dose Route Frequency Provider Last Rate Last Dose  . acetaminophen (TYLENOL) tablet 650 mg  650 mg Oral Q6H PRN Court Joy, PA-C   650 mg at 12/25/12 0941  . alum & mag hydroxide-simeth (MAALOX/MYLANTA) 200-200-20 MG/5ML suspension 30 mL  30 mL Oral Q4H PRN Court Joy, PA-C      . DULoxetine (CYMBALTA) DR capsule 60 mg  60 mg Oral Daily Nehemiah Settle, MD   60 mg at 12/30/12 0824  . gabapentin (NEURONTIN) capsule 400 mg  400 mg Oral TID Nehemiah Settle, MD   400 mg at 12/30/12 1220  . ibuprofen (ADVIL,MOTRIN) tablet 800 mg  800 mg Oral Q8H PRN Rachael Fee, MD   800 mg at 12/26/12 0925  . insulin aspart (novoLOG)  injection 0-15 Units  0-15 Units Subcutaneous TID WC Verne Spurr, PA-C   3 Units at 12/30/12 1221  . insulin aspart (novoLOG) injection 0-5 Units  0-5 Units Subcutaneous QHS Verne Spurr, PA-C   2 Units at 12/26/12 2155  . insulin glargine (LANTUS) injection 55 Units  55 Units Subcutaneous BID Verne Spurr, PA-C   55 Units at 12/30/12 [phone]  . lisinopril (PRINIVIL,ZESTRIL) tablet 10 mg  10 mg Oral Daily Verne Spurr, PA-C    10 mg at 12/30/12 7846  . magnesium hydroxide (MILK OF MAGNESIA) suspension 30 mL  30 mL Oral Daily PRN Court Joy, PA-C      . nicotine polacrilex (NICORETTE) gum 2 mg  2 mg Oral PRN Nehemiah Settle, MD   2 mg at 12/28/12 1436    Lab Results:  Results for orders placed during the hospital encounter of 12/25/12 (from the past 48 hour(s))  GLUCOSE, CAPILLARY     Status: Abnormal   Collection Time    12/26/12  4:53 PM      Result Value Range   Glucose-Capillary 233 (*) 70 - 99 mg/dL  GLUCOSE, CAPILLARY     Status: Abnormal   Collection Time    12/26/12  9:38 PM      Result Value Range   Glucose-Capillary 210 (*) 70 - 99 mg/dL   Comment 1 Documented in Chart    GLUCOSE, CAPILLARY     Status: None   Collection Time    12/27/12  6:21 AM      Result Value Range   Glucose-Capillary 92  70 - 99 mg/dL  GLUCOSE, CAPILLARY     Status: Abnormal   Collection Time    12/27/12 11:21 AM      Result Value Range   Glucose-Capillary 121 (*) 70 - 99 mg/dL   Comment 1 Documented in Chart    GLUCOSE, CAPILLARY     Status: Abnormal   Collection Time    12/27/12  4:56 PM      Result Value Range   Glucose-Capillary 140 (*) 70 - 99 mg/dL  GLUCOSE, CAPILLARY     Status: Abnormal   Collection Time    12/27/12  8:55 PM      Result Value Range   Glucose-Capillary 184 (*) 70 - 99 mg/dL  GLUCOSE, CAPILLARY     Status: Abnormal   Collection Time    12/28/12  6:26 AM      Result Value Range   Glucose-Capillary 67 (*) 70 - 99 mg/dL  GLUCOSE, CAPILLARY     Status: Abnormal   Collection Time    12/28/12  7:36 AM      Result Value Range   Glucose-Capillary 118 (*) 70 - 99 mg/dL  GLUCOSE, CAPILLARY     Status: Abnormal   Collection Time    12/28/12 11:53 AM      Result Value Range   Glucose-Capillary 132 (*) 70 - 99 mg/dL    Physical Findings: AIMS: Facial and Oral Movements Muscles of Facial Expression: None, normal Lips and Perioral Area: None, normal Jaw: None, normal Tongue:  None, normal,Extremity Movements Upper (arms, wrists, hands, fingers): None, normal Lower (legs, knees, ankles, toes): None, normal, Trunk Movements Neck, shoulders, hips: None, normal, Overall Severity Severity of abnormal movements (highest score from questions above): None, normal Incapacitation due to abnormal movements: None, normal Patient's awareness of abnormal movements (rate only patient's report): No Awareness, Dental Status Current problems with teeth and/or dentures?: No Does patient usually  wear dentures?: No  CIWA:  CIWA-Ar Total: 0 COWS:     Treatment Plan Summary: Daily contact with patient to assess and evaluate symptoms and progress in treatment Medication management  Plan: 1. Continue Cymbalta to 60 mg po qd for better symptom control  2. Increase Neurontin 400 mg TID 3. Continue current plan of care as written at this time. 4. ELOS: D/C tomorrow as planned. 5. Will refer to [facility] Transitional care team for psychosocial support, person for assistance with housing, disability, access to medications etc.  Medical Decision Making Problem Points:  Established problem, worsening (2) and New problem, with additional work-up planned (4) Data Points:  Review of new medications or change in dosage (2)  I certify that inpatient services furnished can reasonably be expected to improve the patient's condition.   Rona Ravens. [place] RPAC 2:13 PM 12/30/2012  Reviewed the information documented and agree with the treatment plan.  Constantino Starace,JANARDHAHA R. 12/31/2012 11:21 AM

## 2012-12-30 NOTE — Progress Notes (Signed)
[facility] Group Notes:  (Nursing/MHT/Case Management/Adjunct)  Date:  12/30/2012  Time:  10:38 AM  Type of Therapy:  Therapeutic Activity  Participation Level:  Active  Participation Quality:  Appropriate and Attentive  Affect:  Appropriate  Cognitive:  Appropriate  Insight:  Appropriate  Engagement in Group:  Engaged  Modes of Intervention:  Activity and Socialization  Summary of Progress/Problems: Pt attended group and actively participated.  Macenzie Burford C 12/30/2012, 10:38 AM 

## 2012-12-30 NOTE — Progress Notes (Signed)
Adult Psychoeducational Group Note  Date:  12/30/2012 Time:  11:00AM Group Topic/Focus:  Leisure and Lifestyle changes  Participation Level:  Active  Participation Quality:  Appropriate and Attentive  Affect:  Appropriate  Cognitive:  Alert and Appropriate  Insight: Appropriate  Engagement in Group:  Engaged  Modes of Intervention:  Discussion  Additional Comments:  Pt. Was attentive and appropriate during today's group discussion. Pt. Was able to complete self esteem worksheet on positive thing about yourself for each letter A-Z. Pt was able to come up with the following words in a group setting Alert, active, and hopeful.   Bing Plume D 12/30/2012, 11:44 AM

## 2012-12-30 NOTE — Progress Notes (Signed)
D: Patient's affect/mood is appropriate to circumstance. He reported on the self inventory sheet that his sleep is fair, appetite and ability to pay attention are good and energy level is low. Patient rated depression "3" and feelings of hopelessness "6". He's attending the groups and meals, as well as compliant with medication regimen.  A: Support and encouragement provided to patient. Administered scheduled medications per ordering MD. Monitor Q15 minute checks for safety.  R: Patient receptive. Passive SI, but contracts for safety. Denies HI and AVH. Patient remains safe on the unit.

## 2012-12-30 NOTE — [facility] Group Notes (Signed)
[facility] LCSW Group Therapy  [facility] 1:15 - 2:30 PM  12/30/2012 3:00 PM  Type of Therapy:  Group Therapy  Participation Level:  Active  Participation Quality:  Attentive  Affect:  Appropriate  Cognitive:  Appropriate  Insight:  Developing/Improving and Engaged  Engagement in Therapy:  Developing/Improving Engaged  Modes of Intervention:  Discussion, Education, Exploration, Problem-Solving, Rapport Building, Support  Summary of Progress/Problems:  Patient listened attentively to speaker from [facility].   He advised of interest in following up with services at discharge.  Wynn Banker 12/30/2012  3:00 PM

## 2012-12-30 NOTE — Progress Notes (Signed)
Patient ID: Eric Shaw, male   DOB: 12-20-1960, 52 y.o.   MRN: [id number]  D:  Pt passive SI, but contracts for safety.Pt denies HI/AV. Pt is pleasant and cooperative. Pt stated " I want to speak to PA Lloyd Huger before I leave, I don't think I am stable enough to leave tomorrow, but If she tells me I'm ready then I will just go"   A: Pt was offered support and encouragement. Pt was given scheduled medications. Pt was encourage to attend groups. Q 15 minute checks were done for safety.   R:Pt attends groups and interacts well with peers and staff. Pt is taking medication. Pt has no complaints at this time.Pt receptive to treatment and safety maintained on unit.

## 2012-12-30 NOTE — Progress Notes (Signed)
Adult Psychoeducational Group Note  Date:  12/30/2012 Time:  9:42 PM  Group Topic/Focus:  Making Healthy Choices:   The focus of this group is to help patients identify negative/unhealthy choices they were using prior to admission and identify positive/healthier coping strategies to replace them upon discharge.  Participation Level:  Active  Participation Quality:  Appropriate, Sharing and Supportive  Affect:  Appropriate  Cognitive:  Alert, Appropriate and Oriented  Insight: Appropriate and Good  Engagement in Group:  Engaged and Supportive  Modes of Intervention:  Problem-solving  Additional Comments:  Shaunak was able to share with the group examples of how he was going to transform his thoughts to positive ones.    Annell Greening [place] 12/30/2012, 9:42 PM

## 2012-12-31 LAB — GLUCOSE, CAPILLARY: Glucose-Capillary: 138 mg/dL — ABNORMAL HIGH (ref 70–99)

## 2012-12-31 MED ORDER — INSULIN GLARGINE 100 UNIT/ML ~~LOC~~ SOLN: 55.0000 [IU] | Freq: Two times a day (BID) | SUBCUTANEOUS | Status: DC

## 2012-12-31 MED ORDER — DULOXETINE HCL 60 MG PO CPEP: 60.0000 mg | ORAL_CAPSULE | Freq: Every day | ORAL | Status: DC

## 2012-12-31 MED ORDER — LISINOPRIL 10 MG PO TABS: 10.0000 mg | ORAL_TABLET | Freq: Every day | ORAL | Status: DC

## 2012-12-31 MED ORDER — GABAPENTIN 400 MG PO CAPS: 400.0000 mg | ORAL_CAPSULE | Freq: Three times a day (TID) | ORAL | Status: DC

## 2012-12-31 NOTE — [facility] Group Notes (Signed)
[facility] LCSW Aftercare Discharge Planning Group Note  12/31/2012  8:45 AM  Participation Quality:  Alert and Appropriate   Mood/Affect:  Appropriate and Bright  Depression Rating:  1  Anxiety Rating:  3  Thoughts of Suicide:  Pt denies SI/HI  Will you contract for safety?   Yes  Current AVH:  Pt denies  Plan for Discharge/Comments:  Pt attended discharge planning group and actively participated in group.  CSW provided pt with today's workbook.  Pt states that he feels secure with his d/c plan, after talking to Cathleen Fears, homeless liasion.  Pt states that he will be going to the [facility] in [place].  Pt will follow up at [facility] for medication management and therapy.  No further needs voiced by pt at this time.    Transportation Means: Pt reports access to transportation - will provide pt with bus pass  Supports: No supports mentioned at this time  Reyes Ivan, LCSWA 12/31/2012 9:35 AM

## 2012-12-31 NOTE — Progress Notes (Signed)
Adult Psychoeducational Group Note  Date:  12/31/2012 Time:  11:55 AM  Group Topic/Focus:  Relapse Prevention Planning:   The focus of this group is to define relapse and discuss the need for planning to combat relapse.  Participation Level:  Active  Participation Quality:  Appropriate and Attentive  Affect:  Appropriate  Cognitive:  Alert and Appropriate  Insight: Appropriate and Good  Engagement in Group:  Engaged  Modes of Intervention:  Discussion, Socialization and Support  Additional Comments:  Pt came to group and shared that he would use positive outcome visualization to situations and reading and learning as his go to coping techniques when faced with feelings of relapse.   Cathlean Cower 12/31/2012, 11:55 AM

## 2012-12-31 NOTE — Tx Team (Signed)
Interdisciplinary Treatment Plan Update (Adult)  Date: 12/31/2012  Time Reviewed:  9:45 AM  Progress in Treatment: Attending groups: Yes Participating in groups:  Yes Taking medication as prescribed:  Yes Tolerating medication:  Yes Family/Significant othe contact made: No, pt refused Patient understands diagnosis:  Yes Discussing patient identified problems/goals with staff:  Yes Medical problems stabilized or resolved:  Yes Denies suicidal/homicidal ideation: Yes Issues/concerns per patient self-inventory:  Yes Other:  New problem(s) identified: N/A  Discharge Plan or Barriers: Pt will follow up at [facility] for medication management and therapy.  Pt is homeless now but will work with the transitional team through [place] and Cathleen Fears, homeless liaison.    Reason for Continuation of Hospitalization: Stable to d/c  Comments: N/A  Estimated length of stay: D/C today  For review of initial/current patient goals, please see plan of care.  Attendees: Patient:  Eric Shaw  12/31/2012 10:48 AM   Family:     Physician:  Dr. Javier Glazier 12/31/2012 10:47 AM   Nursing:   Lowella Grip, RN 12/31/2012 10:47 AM   Clinical Social Worker:  Reyes Ivan, LCSWA 12/31/2012 10:47 AM   Other: Verne Spurr, PA 12/31/2012 10:47 AM   Other:  Frankey Shown, MA care coordination 12/31/2012 10:47 AM   Other:  Juline Patch, LCSW 12/31/2012 10:47 AM   Other:     Other:    Other:    Other:    Other:    Other:    Other:     Scribe for Treatment Team:   Carmina Miller, 12/31/2012 10:47 AM

## 2012-12-31 NOTE — [facility] Suicide Risk Assessment (Signed)
Suicide Risk Assessment  Discharge Assessment     Demographic Factors:  Male, Adolescent or young adult, Low socioeconomic status, Living alone and Unemployed  Mental Status Per Nursing Assessment::   On Admission:  Suicidal ideation indicated by patient  Current Mental Status by Physician: NA  Loss Factors: Financial problems/change in socioeconomic status  Historical Factors: Impulsivity  Risk Reduction Factors:   Religious beliefs about death, Positive social support and Positive coping skills or problem solving skills  Continued Clinical Symptoms:  Depression:   Recent sense of peace/wellbeing  Cognitive Features That Contribute To Risk:  Polarized thinking    Suicide Risk:  Minimal: No identifiable suicidal ideation.  Patients presenting with no risk factors but with morbid ruminations; may be classified as minimal risk based on the severity of the depressive symptoms  Discharge Diagnoses:   AXIS I:  Major Depression, single episode AXIS II:  Deferred AXIS III:   Past Medical History  Diagnosis Date  . Diabetes mellitus without complication   . Plantar fasciitis, bilateral   . Arthritis   . Hypertension    AXIS IV:  economic problems, housing problems, occupational problems, problems with access to health care services and problems with primary support group AXIS V:  61-70 mild symptoms  Plan Of Care/Follow-up recommendations:  Activity:  as tolerated Diet:  Regular  Is patient on multiple antipsychotic therapies at discharge:  Yes,   Do you recommend tapering to monotherapy for antipsychotics?  NA   Has Patient had three or more failed trials of antipsychotic monotherapy by history:  No  Recommended Plan for Multiple Antipsychotic Therapies: NA  Svea Pusch,JANARDHAHA R., MD 12/31/2012, 11:30 AM

## 2012-12-31 NOTE — Progress Notes (Signed)
Discharge Note: Discharge instructions/prescriptions/medication samples given to patient. Patient verbalized understanding of discharge instructions and prescriptions. Returned belongings to patient. Denies SI/HI/AVH. Patient d/c without incident to the lobby and will be transported by [employer] bus.

## 2012-12-31 NOTE — Discharge Summary (Signed)
Physician Discharge Summary Note  Patient:  Eric Shaw is an 52 y.o., male MRN:  [id number] DOB:  1960/07/17 Patient phone:  [phone] (home)  Patient address:   [place] [address],   Date of Admission:  12/25/2012 Date of Discharge: 12/31/2012   Reason for Admission:  Suicide attempt  Discharge Diagnoses: Principal Problem:   Severe major depression Active Problems:   Type II or unspecified type diabetes mellitus with peripheral circulatory disorders, uncontrolled(250.72)   Plantar fasciitis, bilateral   Diabetic neuropathy  ROS AXIS I: MDD disorder recurrent severe w/o psychotic features, depression related to a chronic medical condtion.  AXIS II: Deferred  AXIS III:  Past Medical History   Diagnosis  Date   .  Diabetes mellitus without complication    .  Plantar fasciitis, bilateral    .  Arthritis    .  Hypertension     AXIS IV: housing problems, occupational problems, problems related to social environment, problems with access to health care services and problems with primary support group  AXIS V: 31-40 impairment in reality testing  Level of Care:  OP  Hospital Course:  Eric Shaw was admitted voluntarily after a suicide attempt with a Benadryl overdose.  This was a first time admission for Eric Shaw who had never had any previous psychiatric admissions or treatment. He has type 2 diabetes with neuropathy in both feet, as well as plantar fascitis. He had lost his job at [employer] and was unable to find work after that. He lived off of unemployment until it ran out and ultimately lost his home.  He broke into his former place of residence in an attempt to die there. He was found by the maintenance man and brought via ambulance to [facility].      He was evaluated and medication management was initiated. He was on 1:1 for the first 2 days due to his profound depression and inability to contract for safety.        He was also started on a diabetic protocol for his type two  diabetes, as well as Lisinopril for his hypertension and renal protection. He was also placed on Neurontin for his neuropathic pain as well.  He responded well to medication and a supportive environment.        Eric Shaw was established with a representative from [employer] who was able to assist him with housing, follow up appointments for medical and psychiatric care, and to also assist him with the job training for other positions.       By the day of discharge Eric Shaw was in much improved condition and denied SI/HI, no AVH and was motivated to pursue follow up care and continue his treatment.  He was discharged out with the plan to follow up as noted below. Consults:  None  Significant Diagnostic Studies:  labs: CMP, UDS, UA, CBC, HgbA1c  Discharge Vitals:   Blood pressure 133/83, pulse 103, temperature 97.8 F (36.6 C), temperature source Oral, resp. rate 18, height 5' 11.5" (1.816 m), weight 102.059 kg (225 lb), SpO2 99.00%. Body mass index is 30.95 kg/(m^2). Lab Results:   Results for orders placed during the hospital encounter of 12/25/12 (from the past 72 hour(s))  GLUCOSE, CAPILLARY     Status: Abnormal   Collection Time    12/28/12 11:53 AM      Result Value Range   Glucose-Capillary 132 (*) 70 - 99 mg/dL  GLUCOSE, CAPILLARY     Status: Abnormal   Collection Time  12/28/12  5:11 PM      Result Value Range   Glucose-Capillary 196 (*) 70 - 99 mg/dL  GLUCOSE, CAPILLARY     Status: Abnormal   Collection Time    12/28/12  9:21 PM      Result Value Range   Glucose-Capillary 142 (*) 70 - 99 mg/dL   Comment 1 Notify RN     Comment 2 Documented in Chart    GLUCOSE, CAPILLARY     Status: None   Collection Time    12/29/12  6:06 AM      Result Value Range   Glucose-Capillary 88  70 - 99 mg/dL  GLUCOSE, CAPILLARY     Status: Abnormal   Collection Time    12/29/12 11:15 AM      Result Value Range   Glucose-Capillary 162 (*) 70 - 99 mg/dL   Comment 1 Documented in Chart    GLUCOSE,  CAPILLARY     Status: Abnormal   Collection Time    12/29/12  2:22 PM      Result Value Range   Glucose-Capillary 125 (*) 70 - 99 mg/dL  GLUCOSE, CAPILLARY     Status: Abnormal   Collection Time    12/29/12  3:19 PM      Result Value Range   Glucose-Capillary 103 (*) 70 - 99 mg/dL   Comment 1 Documented in Chart    GLUCOSE, CAPILLARY     Status: Abnormal   Collection Time    12/29/12  4:51 PM      Result Value Range   Glucose-Capillary 108 (*) 70 - 99 mg/dL  GLUCOSE, CAPILLARY     Status: None   Collection Time    12/29/12  9:23 PM      Result Value Range   Glucose-Capillary 79  70 - 99 mg/dL   Comment 1 Notify RN    GLUCOSE, CAPILLARY     Status: None   Collection Time    12/30/12  7:01 AM      Result Value Range   Glucose-Capillary 75  70 - 99 mg/dL   Comment 1 Notify RN     Comment 2 Documented in Chart    GLUCOSE, CAPILLARY     Status: Abnormal   Collection Time    12/30/12 11:33 AM      Result Value Range   Glucose-Capillary 163 (*) 70 - 99 mg/dL  GLUCOSE, CAPILLARY     Status: Abnormal   Collection Time    12/30/12  4:57 PM      Result Value Range   Glucose-Capillary 172 (*) 70 - 99 mg/dL  GLUCOSE, CAPILLARY     Status: Abnormal   Collection Time    12/30/12  9:29 PM      Result Value Range   Glucose-Capillary 167 (*) 70 - 99 mg/dL  GLUCOSE, CAPILLARY     Status: Abnormal   Collection Time    12/31/12  6:16 AM      Result Value Range   Glucose-Capillary 105 (*) 70 - 99 mg/dL   Comment 1 Notify RN     Comment 2 Documented in Chart      Physical Findings: AIMS: Facial and Oral Movements Muscles of Facial Expression: None, normal Lips and Perioral Area: None, normal Jaw: None, normal Tongue: None, normal,Extremity Movements Upper (arms, wrists, hands, fingers): None, normal Lower (legs, knees, ankles, toes): None, normal, Trunk Movements Neck, shoulders, hips: None, normal, Overall Severity Severity of abnormal movements (highest score from  questions  above): None, normal Incapacitation due to abnormal movements: None, normal Patient's awareness of abnormal movements (rate only patient's report): No Awareness, Dental Status Current problems with teeth and/or dentures?: No Does patient usually wear dentures?: No  CIWA:  CIWA-Ar Total: 0 COWS:     Psychiatric Specialty Exam: See Psychiatric Specialty Exam and Suicide Risk Assessment completed by Attending Physician prior to discharge.  Discharge destination:  Home  Is patient on multiple antipsychotic therapies at discharge:  No   Has Patient had three or more failed trials of antipsychotic monotherapy by history:  No  Recommended Plan for Multiple Antipsychotic Therapies: NA  Discharge Orders   Future Orders Complete By Expires   Diet - low sodium heart healthy  As directed    Discharge instructions  As directed    Comments:     Take all of your medications as directed. Be sure to keep all of your follow up appointments.  If you are unable to keep your follow up appointment, call your Doctor's office to let them know, and reschedule.  Make sure that you have enough medication to last until your appointment. Be sure to get plenty of rest. Going to bed at the same time each night will help. Try to avoid sleeping during the day.  Increase your activity as tolerated. Regular exercise will help you to sleep better and improve your mental health. Eating a heart healthy diet is recommended. Try to avoid salty or fried foods. Be sure to avoid all alcohol and illegal drugs.   Increase activity slowly  As directed        Medication List       Indication   DULoxetine 60 MG capsule  Commonly known as:  CYMBALTA  Take 1 capsule (60 mg total) by mouth daily. For depression and pain.   Indication:  Neuropathic Pain     gabapentin 400 MG capsule  Commonly known as:  NEURONTIN  Take 1 capsule (400 mg total) by mouth 3 (three) times daily. Take for depression and anxiety, daily.     Depression and pain   insulin glargine 100 UNIT/ML injection  Commonly known as:  LANTUS  Inject 0.55 mLs (55 Units total) into the skin 2 (two) times daily. For glycemic control.   Indication:  Type 2 Diabetes     lisinopril 10 MG tablet  Commonly known as:  PRINIVIL,ZESTRIL  Take 1 tablet (10 mg total) by mouth daily. For hypertension and renal protection.   Indication:  High Blood Pressure       Follow-up Information   Follow up with [facility] On 01/04/2013. (Walk in on this date for hospital discharge appointment.  Walk in clinic is Monday - Friday 8 am - 3 pm.  )    Contact information:   [address], [place] [address] Phone: [phone] Fax: [phone]      Follow-up recommendations:   Activities: Resume activity as tolerated. Diet: Heart healthy low sodium diet Tests: Follow up testing will be determined by your out patient provider. Comments:    Total Discharge Time:  Greater than 30 minutes.  Signed: Rona Ravens. [place] RPAC 10:38 AM 12/31/2012  The patient is seen face-to-face for psychiatric evaluation, treatment plan developed and case discussed with physician extended periodReviewed the information documented and agree with the treatment plan.  Kindell Strada,JANARDHAHA R. 01/01/2013 7:26 PM

## 2012-12-31 NOTE — Progress Notes (Signed)
Pt observed resting in bed with eyes closed. RR WNL, even and unlabored. NAD. Level III obs in place for safety and pt is safe. Lawrence Marseilles

## 2012-12-31 NOTE — Progress Notes (Signed)
[facility] Adult Case Management Discharge Plan :  Will you be returning to the same living situation after discharge: No. Pt lost apartment prior to admission and is now homeless.  Pt verbalizes d/c plan to stay at [facility].  Pt is also linked with Cathleen Fears, homeless liasion and the transitional team through [place].   At discharge, do you have transportation home?:Yes,  provided pt with bus pass and directions Do you have the ability to pay for your medications:Yes,  access to meds  Release of information consent forms completed and in the chart;  Patient's signature needed at discharge.  Patient to Follow up at: Follow-up Information   Follow up with [facility] On 01/04/2013. (Walk in on this date for hospital discharge appointment.  Walk in clinic is Monday - Friday 8 am - 3 pm.  )    Contact information:   [address], [place] [address] Phone: [phone] Fax: [phone]      Patient denies SI/HI:   Yes,  denies SI/HI    Safety Planning and Suicide Prevention discussed:  Yes,  discussed with pt.  Pt refused consent to provide to family/friend.  See suicide prevention education note.   Carmina Miller 12/31/2012, 12:00 PM

## 2012-12-31 NOTE — [facility] Group Notes (Signed)
[facility] LCSW Group Therapy  12/31/2012  1:15 PM   Type of Therapy:  Group Therapy  Participation Level:  Active  Participation Quality:  Appropriate and Attentive  Affect:  Appropriate  Cognitive:  Alert and Appropriate  Insight:  Developing/Improving and Engaged  Engagement in Therapy:  Developing/Improving and Engaged  Modes of Intervention:  Clarification, Confrontation, Discussion, Education, Exploration, Limit-setting, Orientation, Problem-solving, Rapport Building, Dance movement psychotherapist, Socialization and Support  Summary of Progress/Problems: The topic for today was feelings about relapse.  Pt discussed what relapse prevention is to them and identified triggers that they are on the path to relapse.  Pt processed their feeling towards relapse and was able to relate to peers.  Pt discussed coping skills that can be used for relapse prevention.   Pt sat through half of group discussion before being pulled to be discharged.    Eric Shaw, LCSWA 12/31/2012 2:04 PM

## 2013-01-05 NOTE — Progress Notes (Signed)
Patient Discharge Instructions:  After Visit Summary (AVS):   Faxed to:  12/05/12 Discharge Summary Note:   Faxed to:  12/05/12 Psychiatric Admission Assessment Note:   Faxed to:  12/05/12 Suicide Risk Assessment - Discharge Assessment:   Faxed to:  12/05/12 Faxed/Sent to the Next Level Care provider:  12/05/12 Faxed to [facility] @ [phone]  Jerelene Redden, 01/05/2013, 3:58 PM

## 2013-01-19 ENCOUNTER — Emergency Department (HOSPITAL_COMMUNITY)
Admission: EM | Admit: 2013-01-19 | Discharge: 2013-01-19 | Disposition: A | Discharge status: Home / Self Care | Payer: Self-pay | Attending: Emergency Medicine | Admitting: Emergency Medicine

## 2013-01-19 ENCOUNTER — Emergency Department (HOSPITAL_COMMUNITY): Payer: Self-pay

## 2013-01-19 ENCOUNTER — Encounter (HOSPITAL_COMMUNITY): Payer: Self-pay | Admitting: Emergency Medicine

## 2013-01-19 DIAGNOSIS — M129 Arthropathy, unspecified: Secondary | ICD-10-CM | POA: Insufficient documentation

## 2013-01-19 DIAGNOSIS — N390 Urinary tract infection, site not specified: Secondary | ICD-10-CM | POA: Insufficient documentation

## 2013-01-19 DIAGNOSIS — Z794 Long term (current) use of insulin: Secondary | ICD-10-CM | POA: Insufficient documentation

## 2013-01-19 DIAGNOSIS — R4182 Altered mental status, unspecified: Secondary | ICD-10-CM | POA: Insufficient documentation

## 2013-01-19 DIAGNOSIS — F17200 Nicotine dependence, unspecified, uncomplicated: Secondary | ICD-10-CM | POA: Insufficient documentation

## 2013-01-19 DIAGNOSIS — R5383 Other fatigue: Secondary | ICD-10-CM

## 2013-01-19 DIAGNOSIS — I10 Essential (primary) hypertension: Secondary | ICD-10-CM | POA: Insufficient documentation

## 2013-01-19 DIAGNOSIS — Z79899 Other long term (current) drug therapy: Secondary | ICD-10-CM | POA: Insufficient documentation

## 2013-01-19 DIAGNOSIS — E119 Type 2 diabetes mellitus without complications: Secondary | ICD-10-CM | POA: Insufficient documentation

## 2013-01-19 LAB — BLOOD GAS, ARTERIAL
Acid-Base Excess: 1.8 mmol/L (ref 0.0–2.0)
Bicarbonate: 26 mEq/L — ABNORMAL HIGH (ref 20.0–24.0)
Drawn by: 308601
FIO2: 0.21 %
O2 Saturation: 94.8 %
Patient temperature: 98.6
TCO2: 22.7 mmol/L (ref 0–100)
pCO2 arterial: 41.4 mmHg (ref 35.0–45.0)
pH, Arterial: 7.415 (ref 7.350–7.450)
pO2, Arterial: 67.5 mmHg — ABNORMAL LOW (ref 80.0–100.0)

## 2013-01-19 LAB — RAPID URINE DRUG SCREEN, HOSP PERFORMED
Amphetamines: NOT DETECTED
Barbiturates: NOT DETECTED
Benzodiazepines: NOT DETECTED
Cocaine: NOT DETECTED
Opiates: NOT DETECTED
Tetrahydrocannabinol: NOT DETECTED

## 2013-01-19 LAB — SALICYLATE LEVEL: Salicylate Lvl: <2 mg/dL — ABNORMAL LOW (ref 2.8–20.0)

## 2013-01-19 LAB — COMPREHENSIVE METABOLIC PANEL
ALT: 23 U/L (ref 0–53)
AST: 21 U/L (ref 0–37)
Albumin: 3.4 g/dL — ABNORMAL LOW (ref 3.5–5.2)
Alkaline Phosphatase: 82 U/L (ref 39–117)
BUN: 18 mg/dL (ref 6–23)
CO2: 26 mEq/L (ref 19–32)
Calcium: 9.4 mg/dL (ref 8.4–10.5)
Chloride: 97 mEq/L (ref 96–112)
Creatinine, Ser: 0.82 mg/dL (ref 0.50–1.35)
GFR calc Af Amer: >90 mL/min (ref >90)
GFR calc non Af Amer: >90 mL/min (ref >90)
Glucose, Bld: 265 mg/dL — ABNORMAL HIGH (ref 70–99)
Potassium: 4 mEq/L (ref 3.5–5.1)
Sodium: 135 mEq/L (ref 135–145)
Total Bilirubin: 0.3 mg/dL (ref 0.3–1.2)
Total Protein: 7 g/dL (ref 6.0–8.3)

## 2013-01-19 LAB — CBC WITH DIFFERENTIAL/PLATELET
Basophils Relative: 1 % (ref 0–1)
Eosinophils Absolute: 0.2 10*3/uL (ref 0.0–0.7)
Eosinophils Relative: 3 % (ref 0–5)
HCT: 40.6 % (ref 39.0–52.0)
Hemoglobin: 14.3 g/dL (ref 13.0–17.0)
MCH: 31.1 pg (ref 26.0–34.0)
MCHC: 35.2 g/dL (ref 30.0–36.0)
Monocytes Absolute: 0.6 10*3/uL (ref 0.1–1.0)
Monocytes Relative: 7 % (ref 3–12)

## 2013-01-19 LAB — CARBOXYHEMOGLOBIN
Carboxyhemoglobin: 6.7 % (ref 0.5–1.5)
Methemoglobin: 1.6 % — ABNORMAL HIGH (ref 0.0–1.5)
O2 Saturation: 94.8 %
Total hemoglobin: 14.4 g/dL (ref 13.5–18.0)

## 2013-01-19 LAB — GLUCOSE, CAPILLARY: Glucose-Capillary: 283 mg/dL — ABNORMAL HIGH (ref 70–99)

## 2013-01-19 LAB — ACETAMINOPHEN LEVEL: Acetaminophen (Tylenol), Serum: <15 ug/mL (ref 10–30)

## 2013-01-19 LAB — URINALYSIS, ROUTINE W REFLEX MICROSCOPIC
Glucose, UA: >1000 mg/dL — AB
Nitrite: POSITIVE — AB
Protein, ur: 30 mg/dL — AB
Urobilinogen, UA: 1 mg/dL (ref 0.0–1.0)

## 2013-01-19 LAB — ETHANOL: Alcohol, Ethyl (B): <11 mg/dL (ref 0–11)

## 2013-01-19 LAB — URINE MICROSCOPIC-ADD ON

## 2013-01-19 LAB — CK: Total CK: 165 U/L (ref 7–232)

## 2013-01-19 LAB — CG4 I-STAT (LACTIC ACID)
Lactic Acid, Venous: 2.54 mmol/L — ABNORMAL HIGH (ref 0.5–2.2)
Lactic Acid, Venous: 1.68 mmol/L (ref 0.5–2.2)

## 2013-01-19 LAB — TROPONIN I: Troponin I: <0.3 ng/mL (ref <0.30)

## 2013-01-19 MED ORDER — DEXTROSE 5 % IV SOLN
1.0000 g | Freq: Once | INTRAVENOUS | Status: AC
  Administered 2013-01-19: 1 g via INTRAVENOUS
  Filled 2013-01-19: qty 10

## 2013-01-19 MED ORDER — CEPHALEXIN 500 MG PO CAPS: 500.0000 mg | ORAL_CAPSULE | Freq: Four times a day (QID) | ORAL | Status: DC

## 2013-01-19 MED ORDER — SODIUM CHLORIDE 0.9 % IV BOLUS (SEPSIS)
1000.0000 mL | Freq: Once | INTRAVENOUS | Status: AC
  Administered 2013-01-19: 1000 mL via INTRAVENOUS

## 2013-01-19 MED ORDER — IBUPROFEN 800 MG PO TABS
800.0000 mg | ORAL_TABLET | Freq: Once | ORAL | Status: AC
  Administered 2013-01-19: 800 mg via ORAL
  Filled 2013-01-19: qty 1

## 2013-01-19 NOTE — ED Provider Notes (Signed)
A she and CSN: [id number]     Arrival date & time 01/19/13  2057 History   First MD Initiated Contact with Patient 01/19/13 2102     Chief Complaint  Patient presents with  . Fatigue   (Consider location/radiation/quality/duration/timing/severity/associated sxs/prior Treatment) HPI Comments: From homeless shelter with fatigue and lethargy since 12pm.  Patient states he enlisted weight since lunch. He denies taking any medications of breath his. He denies any illicit drug or alcohol use. He is a diabetic and has a history of hypertension. Denies any falls or trauma. He denies any pain. He denies any suicidal or homicidal thoughts. He denies any ingestions. Notably he was admitted a month ago her Benadryl ingestion. He denies this today.  The history is provided by the patient and the EMS personnel. The history is limited by the condition of the patient.    Past Medical History  Diagnosis Date  . Diabetes mellitus without complication   . Plantar fasciitis, bilateral   . Arthritis   . Hypertension    Past Surgical History  Procedure Laterality Date  . Reduction of torsion of testis     History reviewed. No pertinent family history. History  Substance Use Topics  . Smoking status: Current Every Day Smoker -- 0.50 packs/day for 35 years    Types: Cigarettes  . Smokeless tobacco: Not on file  . Alcohol Use: Yes     Comment: Occasionally; has not had any etoh in 8 months    Review of Systems  Unable to perform ROS: Mental status change    Allergies  Review of patient's allergies indicates no known allergies.  Home Medications   Current Outpatient Rx  Name  Route  Sig  Dispense  Refill  . cephALEXin (KEFLEX) 500 MG capsule   Oral   Take 1 capsule (500 mg total) by mouth 4 (four) times daily.   40 capsule   0   . DULoxetine (CYMBALTA) 60 MG capsule   Oral   Take 1 capsule (60 mg total) by mouth daily. For depression and pain.   30 capsule   0   . gabapentin  (NEURONTIN) 400 MG capsule   Oral   Take 1 capsule (400 mg total) by mouth 3 (three) times daily. Take for depression and anxiety, daily.   90 capsule   0   . insulin glargine (LANTUS) 100 UNIT/ML injection   Subcutaneous   Inject 0.55 mLs (55 Units total) into the skin 2 (two) times daily. For glycemic control.   10 mL   12   . lisinopril (PRINIVIL,ZESTRIL) 10 MG tablet   Oral   Take 1 tablet (10 mg total) by mouth daily. For hypertension and renal protection.   30 tablet   0    BP 138/76  Pulse 91  Temp(Src) 98.8 F (37.1 C) (Oral)  Resp 16  Ht 5\' 10"  (1.778 m)  SpO2 98% Physical Exam  Constitutional: He is oriented to person, place, and time. He appears well-developed and well-nourished.  Very Drowsy, arouses to voice and answers questions the falls asleep.  HENT:  Head: Normocephalic and atraumatic.  Mouth/Throat: Oropharynx is clear and moist. No oropharyngeal exudate.  Eyes: Conjunctivae and EOM are normal. Pupils are equal, round, and reactive to light.  Neck: Normal range of motion. Neck supple.  No meningismus  Cardiovascular: Normal rate, regular rhythm, normal heart sounds and intact distal pulses.   No murmur heard. Pulmonary/Chest: Effort normal and breath sounds normal. No respiratory distress.  Abdominal: Soft. There is no tenderness. There is no rebound and no guarding.  Musculoskeletal: Normal range of motion. He exhibits no edema and no tenderness.  Neurological: He is alert and oriented to person, place, and time. No cranial nerve deficit. He exhibits normal muscle tone. Coordination normal.  CN 2-12 intact, no ataxia on finger to nose, no nystagmus, 5/5 strength throughout, no pronator drift, Romberg negative, normal gait.   Skin: Skin is warm. No rash noted.    ED Course  Procedures (including critical care time) Labs Review Labs Reviewed  COMPREHENSIVE METABOLIC PANEL - Abnormal; Notable for the following:    Glucose, Bld 265 (*)    Albumin  3.4 (*)    All other components within normal limits  SALICYLATE LEVEL - Abnormal; Notable for the following:    Salicylate Lvl <2.0 (*)    All other components within normal limits  URINALYSIS, ROUTINE W REFLEX MICROSCOPIC - Abnormal; Notable for the following:    APPearance CLOUDY (*)    Glucose, UA >1000 (*)    Protein, ur 30 (*)    Nitrite POSITIVE (*)    Leukocytes, UA MODERATE (*)    All other components within normal limits  GLUCOSE, CAPILLARY - Abnormal; Notable for the following:    Glucose-Capillary 283 (*)    All other components within normal limits  BLOOD GAS, ARTERIAL - Abnormal; Notable for the following:    pO2, Arterial 67.5 (*)    Bicarbonate 26.0 (*)    All other components within normal limits  CARBOXYHEMOGLOBIN - Abnormal; Notable for the following:    Carboxyhemoglobin 6.7 (*)    Methemoglobin 1.6 (*)    All other components within normal limits  URINE MICROSCOPIC-ADD ON - Abnormal; Notable for the following:    Bacteria, UA MANY (*)    All other components within normal limits  CG4 I-STAT (LACTIC ACID) - Abnormal; Notable for the following:    Lactic Acid, Venous 2.54 (*)    All other components within normal limits  URINE CULTURE  CBC WITH DIFFERENTIAL  TROPONIN I  ETHANOL  CK  ACETAMINOPHEN LEVEL  URINE RAPID DRUG SCREEN (HOSP PERFORMED)  CG4 I-STAT (LACTIC ACID)   Imaging Review Dg Chest 2 View  01/19/2013   CLINICAL DATA:  Fatigue and hypertension  EXAM: CHEST  2 VIEW  COMPARISON:  Chest radiograph January 20, 2007 and chest CT January 25, 2007  FINDINGS: There is no edema or consolidation. Heart size and pulmonary vascularity are within normal limits. No adenopathy. There is degenerative change in the thoracic spine.  IMPRESSION: No edema or consolidation.   Electronically Signed   By: Bretta Bang   On: 01/19/2013 22:00   Ct Head Wo Contrast  01/19/2013   CLINICAL DATA:  Altered mental status. Fatigue.  EXAM: CT HEAD WITHOUT CONTRAST   TECHNIQUE: Contiguous axial images were obtained from the base of the skull through the vertex without intravenous contrast.  COMPARISON:  No priors.  FINDINGS: No acute intracranial abnormalities. Specifically, no evidence of acute intracranial hemorrhage, no definite findings of acute/subacute cerebral ischemia, no mass, mass effect, hydrocephalus or abnormal intra or extra-axial fluid collections. Visualized paranasal sinuses and mastoids are well pneumatized. No acute displaced skull fractures are identified.  IMPRESSION: * No acute intracranial abnormalities. *The appearance of the brain is normal.   Electronically Signed   By: Trudie Reed M.D.   On: 01/19/2013 22:35    MDM   1. Urinary tract infection   2. Fatigue  Decreased mental status with fatigue and somnolence for the past 9 hours. Slow to respond but nonfocal exam, moving all extremities. No fever. Answers questions appropriately.  CT head negative. UA + for infection.  Carboxyhemoglobin 6.7, patient is a smoker. No CO2 retention.  Patient is unable to sit up or stand up unassisted. He cannot support his body weight. Do not believe UTIs causing his profound weakness. His neurological exam remained nonfocal and is somewhat improved from his initial presentation.  Hyperglycemia without DKA. Anion gap is 12. Patient now able to ambulate without assistance. Question possible secondary gain.  On walking, patient began complaining of gradual onset R sided headache. No focal weakness, numbness, tingling. CT head negative. No thunderclap onset.   Date: 01/19/2013  Rate: 84  Rhythm: normal sinus rhythm  QRS Axis: normal  Intervals: normal  ST/T Wave abnormalities: normal  Conduction Disutrbances:none  Narrative Interpretation:   Old EKG Reviewed: none available    Glynn Octave, MD 01/20/13 0111

## 2013-01-19 NOTE — ED Notes (Signed)
Bed: MV78 Expected date: 01/19/13 Expected time: 8:40 PM Means of arrival: Ambulance Comments: lethargic

## 2013-01-19 NOTE — ED Notes (Signed)
Patient transported to CT 

## 2013-01-19 NOTE — ED Notes (Signed)
Pt was unable to walk at this time. Pt still unable to hold himself up w/o assistance.

## 2013-01-19 NOTE — ED Notes (Signed)
Pt ambulated 10 feet outside of room, started complaining of severed headache. MD assessed pt while walking. Pt taken back to room to be given pain medicine.

## 2013-01-19 NOTE — ED Notes (Signed)
Per EMS: picked up from homeless shelter, friends state he has been drowsy and sleepy since lunch. No complaints of SOB or pain.

## 2013-01-21 LAB — URINE CULTURE

## 2013-03-09 ENCOUNTER — Emergency Department (HOSPITAL_COMMUNITY)
Admission: EM | Admit: 2013-03-09 | Discharge: 2013-03-09 | Disposition: A | Discharge status: Home / Self Care | Payer: PRIVATE HEALTH INSURANCE | Attending: Emergency Medicine | Admitting: Emergency Medicine

## 2013-03-09 ENCOUNTER — Encounter (HOSPITAL_COMMUNITY): Payer: Self-pay | Admitting: Emergency Medicine

## 2013-03-09 ENCOUNTER — Emergency Department (INDEPENDENT_AMBULATORY_CARE_PROVIDER_SITE_OTHER)
Admission: EM | Admit: 2013-03-09 | Discharge: 2013-03-09 | Disposition: A | Discharge status: Home / Self Care | Payer: Medicaid Other | Source: Home / Self Care | Attending: Emergency Medicine | Admitting: Emergency Medicine

## 2013-03-09 ENCOUNTER — Emergency Department (HOSPITAL_COMMUNITY): Payer: PRIVATE HEALTH INSURANCE

## 2013-03-09 DIAGNOSIS — L03211 Cellulitis of face: Secondary | ICD-10-CM

## 2013-03-09 DIAGNOSIS — L0201 Cutaneous abscess of face: Secondary | ICD-10-CM | POA: Insufficient documentation

## 2013-03-09 DIAGNOSIS — E1159 Type 2 diabetes mellitus with other circulatory complications: Secondary | ICD-10-CM

## 2013-03-09 DIAGNOSIS — R220 Localized swelling, mass and lump, head: Secondary | ICD-10-CM | POA: Insufficient documentation

## 2013-03-09 DIAGNOSIS — R110 Nausea: Secondary | ICD-10-CM | POA: Insufficient documentation

## 2013-03-09 DIAGNOSIS — M129 Arthropathy, unspecified: Secondary | ICD-10-CM | POA: Insufficient documentation

## 2013-03-09 DIAGNOSIS — I10 Essential (primary) hypertension: Secondary | ICD-10-CM | POA: Insufficient documentation

## 2013-03-09 DIAGNOSIS — Z794 Long term (current) use of insulin: Secondary | ICD-10-CM | POA: Insufficient documentation

## 2013-03-09 DIAGNOSIS — Z79899 Other long term (current) drug therapy: Secondary | ICD-10-CM | POA: Insufficient documentation

## 2013-03-09 DIAGNOSIS — F17200 Nicotine dependence, unspecified, uncomplicated: Secondary | ICD-10-CM | POA: Insufficient documentation

## 2013-03-09 DIAGNOSIS — E119 Type 2 diabetes mellitus without complications: Secondary | ICD-10-CM | POA: Insufficient documentation

## 2013-03-09 LAB — CBC WITH DIFFERENTIAL/PLATELET
Basophils Relative: 1 % (ref 0–1)
Eosinophils Relative: 3 % (ref 0–5)
Hemoglobin: 15.9 g/dL (ref 13.0–17.0)
Lymphocytes Relative: 24 % (ref 12–46)
Lymphs Abs: 3.4 10*3/uL (ref 0.7–4.0)
MCV: 90.2 fL (ref 78.0–100.0)
Monocytes Relative: 8 % (ref 3–12)
Neutrophils Relative %: 64 % (ref 43–77)
Platelets: 227 10*3/uL (ref 150–400)
RBC: 4.82 MIL/uL (ref 4.22–5.81)
RDW: 14.9 % (ref 11.5–15.5)
WBC: 14.1 10*3/uL — ABNORMAL HIGH (ref 4.0–10.5)

## 2013-03-09 LAB — BASIC METABOLIC PANEL
CO2: 29 mEq/L (ref 19–32)
Chloride: 97 mEq/L (ref 96–112)
Sodium: 134 mEq/L — ABNORMAL LOW (ref 135–145)

## 2013-03-09 MED ORDER — CLINDAMYCIN HCL 150 MG PO CAPS: 450.0000 mg | ORAL_CAPSULE | Freq: Three times a day (TID) | ORAL | Status: DC

## 2013-03-09 MED ORDER — ONDANSETRON HCL 4 MG/2ML IJ SOLN
4.0000 mg | Freq: Once | INTRAMUSCULAR | Status: AC
  Administered 2013-03-09: 4 mg via INTRAVENOUS
  Filled 2013-03-09: qty 2

## 2013-03-09 MED ORDER — IOHEXOL 300 MG/ML  SOLN
80.0000 mL | Freq: Once | INTRAMUSCULAR | Status: AC | PRN
  Administered 2013-03-09: 80 mL via INTRAVENOUS

## 2013-03-09 MED ORDER — MORPHINE SULFATE 4 MG/ML IJ SOLN
4.0000 mg | Freq: Once | INTRAMUSCULAR | Status: AC
  Administered 2013-03-09: 4 mg via INTRAVENOUS
  Filled 2013-03-09: qty 1

## 2013-03-09 MED ORDER — CLINDAMYCIN PHOSPHATE 600 MG/50ML IV SOLN
600.0000 mg | Freq: Once | INTRAVENOUS | Status: AC
  Administered 2013-03-09: 600 mg via INTRAVENOUS
  Filled 2013-03-09: qty 50

## 2013-03-09 MED ORDER — OXYCODONE-ACETAMINOPHEN 5-325 MG PO TABS: 1.0000 | ORAL_TABLET | Freq: Four times a day (QID) | ORAL | Status: DC | PRN

## 2013-03-09 MED ORDER — ONDANSETRON 4 MG PO TBDP: 4.0000 mg | ORAL_TABLET | Freq: Three times a day (TID) | ORAL | Status: DC | PRN

## 2013-03-09 NOTE — ED Provider Notes (Signed)
Medical screening examination/treatment/procedure(s) were conducted as a shared visit with non-physician practitioner(s) and myself.  I personally evaluated the patient during the encounter.  Diabetic who awoke this morning with facial swelling. No dental pain, no difficulty breathing or swallowing. Floor of mouth soft. No trismus. No asymmetry. Induration over left cheek. CT scan shows no drainable abscess. Start antibiotics for a cellulitis likely from ingrown hair. No evidence of ludwig's angina. Patient wishes to go home. We'll trial by mouth antibiotics. Return precautions discussed and recheck in 2 days.   EKG Interpretation   None         Glynn Octave, MD 03/09/13 1655

## 2013-03-09 NOTE — ED Notes (Signed)
Pt  Reports    Facial  Swelling  l  Side         Of face  Pt  States  It  Developed  During  The  Night            denys  Any  Toothache              He  Reports  Nausea

## 2013-03-09 NOTE — ED Notes (Signed)
Patient is resting comfortably, wanted BS checked because he is hungry.

## 2013-03-09 NOTE — ED Provider Notes (Signed)
CSN: [id number]     Arrival date & time 03/09/13  1035 History   First MD Initiated Contact with Patient 03/09/13 1113     Chief Complaint  Patient presents with  . Abscess   (Consider location/radiation/quality/duration/timing/severity/associated sxs/prior Treatment) HPI Comments: Patient with a history of Insulin Dependent DM type II presents with a chief complaint of left facial swelling.  He was seen at Urgent Care just prior to arrival and sent to the ED for additional management of facial cellulitis.  He reports that the swelling has been present since last evening, which is gradually worsening.  He noticed an ingrown hair in the area of the swelling yesterday.  He denies any trismus, neck pain, neck stiffness, trismus, difficulty swallowing, or difficulty breathing.  He denies fever or chills.  He denies any dental pain.  He reports some associated nausea, but denies vomiting.  No treatment prior to arrival.  He reports that the area is painful.  He has not taken anything for pain prior to arrival. He has not noticed any drainage of the area.  He states that he has never had an abscess before.    The history is provided by the patient.    Past Medical History  Diagnosis Date  . Diabetes mellitus without complication   . Plantar fasciitis, bilateral   . Arthritis   . Hypertension    Past Surgical History  Procedure Laterality Date  . Reduction of torsion of testis     History reviewed. No pertinent family history. History  Substance Use Topics  . Smoking status: Current Every Day Smoker -- 0.50 packs/day for 35 years    Types: Cigarettes  . Smokeless tobacco: Not on file  . Alcohol Use: Yes     Comment: Occasionally; has not had any etoh in 8 months    Review of Systems  HENT: Positive for facial swelling.   All other systems reviewed and are negative.    Allergies  Review of patient's allergies indicates no known allergies.  Home Medications   Current Outpatient Rx   Name  Route  Sig  Dispense  Refill  . ARIPiprazole (ABILIFY) 2 MG tablet   Oral   Take 2 mg by mouth daily.         Marland Kitchen atenolol (TENORMIN) 50 MG tablet   Oral   Take 50 mg by mouth daily.         . DULoxetine (CYMBALTA) 60 MG capsule   Oral   Take 1 capsule (60 mg total) by mouth daily. For depression and pain.   30 capsule   0   . gabapentin (NEURONTIN) 400 MG capsule   Oral   Take 1 capsule (400 mg total) by mouth 3 (three) times daily. Take for depression and anxiety, daily.   90 capsule   0   . insulin detemir (LEVEMIR) 100 UNIT/ML injection   Subcutaneous   Inject 55 Units into the skin 2 (two) times daily.         . insulin lispro (HUMALOG) 100 UNIT/ML injection   Subcutaneous   Inject 10 Units into the skin 3 (three) times daily before meals.         Marland Kitchen lisinopril (PRINIVIL,ZESTRIL) 10 MG tablet   Oral   Take 1 tablet (10 mg total) by mouth daily. For hypertension and renal protection.   30 tablet   0   . naproxen (NAPROSYN) 500 MG tablet   Oral   Take 500 mg by mouth 2 (  two) times daily as needed for moderate pain.          BP 152/60  Pulse 82  Temp(Src) 98.4 F (36.9 C) (Oral)  Resp 18  Ht 5\' 10"  (1.778 m)  Wt 234 lb (106.142 kg)  BMI 33.58 kg/m2  SpO2 97% Physical Exam  Nursing note and vitals reviewed. Constitutional: He appears well-developed and well-nourished.  HENT:  Head: Normocephalic and atraumatic.    Mouth/Throat: Uvula is midline, oropharynx is clear and moist and mucous membranes are normal. No trismus in the jaw. No uvula swelling. No oropharyngeal exudate, posterior oropharyngeal edema or posterior oropharyngeal erythema.  Airway widely patent No obvious dental abscess No sublingual tenderness of tongue elevation No submental swelling  Neck: Normal range of motion. Neck supple.  Cardiovascular: Normal rate, regular rhythm and normal heart sounds.   Pulmonary/Chest: Effort normal and breath sounds normal.  Neurological:  He is alert.  Skin: Skin is warm and dry.  Psychiatric: He has a normal mood and affect.    ED Course  Procedures (including critical care time) Labs Review Labs Reviewed  CBC WITH DIFFERENTIAL  BASIC METABOLIC PANEL   Imaging Review Ct Maxillofacial W/cm  03/09/2013   CLINICAL DATA:  Sudden onset of left face and jaw swelling.  EXAM: CT MAXILLOFACIAL WITH CONTRAST  TECHNIQUE: Multidetector CT imaging of the maxillofacial structures was performed with intravenous contrast. Multiplanar CT image reconstructions were also generated. A small metallic BB was placed on the right temple in order to reliably differentiate right from left.  CONTRAST:  80mL OMNIPAQUE IOHEXOL 300 MG/ML  SOLN  COMPARISON:  Head CT 01/19/2013  FINDINGS: Visualized intracranial structures are normal. Normal appearance of both globes. Soft tissue swelling along the left jaw and left maxilla region. There is no focal fluid or abscess collection. Mild soft tissue swelling in the submental region. Normal appearance of the parotid tissue bilaterally. The submandibular glands are symmetric. Small lymph nodes in the submandibular region. No gross abnormalities in the nasopharynx or oropharynx.  Parapharyngeal fat appears normal. Mild degenerative changes in the upper cervical spine. No acute bone abnormality. No significant dental disease.  IMPRESSION: Soft tissue swelling along the left side of the face and jaw. No underlying fluid or abscess collection. The source for the inflammation is unknown.   Electronically Signed   By: Richarda Overlie M.D.   On: 03/09/2013 15:14    EKG Interpretation   None       MDM  No diagnosis found. Patient presenting with swelling of the left face that has been present since last evening.  CT showing soft tissue swelling, but no underlying fluid or abscess collection.  Patient currently afebrile.  Patient able to tolerate PO liquids.  Airway widely patent.  No difficulty swallowing or difficulty  breathing.  No trismus.  No sublingual tenderness or tongue elevation.  No signs of Ludwig's Angina.  Discussed the option of admission with the patient and he states that he does not want to stay in the hospital.  Patient given IV Clindamycin in the ED and discharged home with oral Clindamycin.  Patient instructed to have the area rechecked in two days.  Strict return precautions given.     Santiago Glad, PA-C 03/09/13 1538

## 2013-03-09 NOTE — ED Notes (Signed)
Per pt sts what he thinks started as an ingrown hair is now severe facial swelling. sts started last night. Severe swelling to the left side of face.

## 2013-03-09 NOTE — ED Notes (Signed)
CBG 184  

## 2013-03-09 NOTE — ED Provider Notes (Signed)
Chief Complaint:   Chief Complaint  Patient presents with  . Facial Swelling    History of Present Illness:   Eric Shaw is a 52 year old type II diabetic who has had a history since midnight last night of pain and swelling in the left jaw area. He does not have any painful or carious teeth. He did have an ingrown hair in the left jaw area. The face is markedly swollen and very painful. He has difficulty eating. No trouble breathing. He does feel somewhat nauseated but has not vomited. He denies any fever or chills. There is no swelling around the forehead, eye, or cheek area. He is able to open his mouth fully but it hurts.  Review of Systems:  Other than noted above, the patient denies any of the following symptoms: Systemic:  No fevers, chills, sweats, weight loss or gain, fatigue, or tiredness. Eye:  No redness, pain, discharge, itching, blurred vision, or diplopia. ENT:  No headache, nasal congestion, sneezing, itching, epistaxis, ear pain, congestion, decreased hearing, ringing in ears, vertigo, or tinnitus.  No oral lesions, sore throat, pain on swallowing, or hoarseness. Neck:  No mass, tenderness or adenopathy. Lungs:  No coughing, wheezing, or shortness of breath. Skin:  No rash or itching.  PMFSH:  Past medical history, family history, social history, meds, and allergies were reviewed. He has no medication allergies. He has a history of diabetes, peripheral neuropathy, hypertension, and depression. He takes Abilify, Humalog, Levemir 55 units twice a day, Cymbalta, Neurontin, atenolol, lisinopril, and Aleve.  Physical Exam:   Vital signs:  BP 168/88  Pulse 87  Temp(Src) 98.1 F (36.7 C) (Oral)  Resp 20  SpO2 100% General:  Alert and oriented.  In no distress.  Skin warm and dry. Eye:  PERRL, full EOMs, lids and conjunctiva normal.   ENT:  TMs and canals clear.  Nasal mucosa not congested and without drainage.  Mucous membranes moist, no oral lesions, normal dentition, pharynx  clear.  Exam of the face reveals marked swelling and pain to palpation over the left lower face, beginning in the lower lip area and extending towards the jaw. This extends up to the maxillary area as well. The swelling is visible both externally and inside the mouth. His teeth are in very good repair and there is no obvious carious or painful teeth. There is no swelling of the gingiva. There was no fluctuance in the area. Neck:  Supple, full ROM.  No adenopathy, tenderness or mass.  Thyroid normal. Lungs:  Breath sounds clear and equal bilaterally.  No wheezes, rales or rhonchi. Heart:  Rhythm regular, without extrasystoles.  No gallops or murmers. Skin:  Clear, warm and dry.   CBG was 216.  Assessment:  The primary encounter diagnosis was Facial cellulitis. A diagnosis of Type II or unspecified type diabetes mellitus with peripheral circulatory disorders, uncontrolled(250.72) was also pertinent to this visit.  I do not think that this is a dental infection. His teeth look in good repair. I think this is a facial cellulitis. He is at high risk since he is a type II diabetic, and also he's had some nausea. I think he would be a good candidate for the IV saline his protocol.  Plan:  The patient was transferred to the ED via shuttle in stable condition.  Medical Decision Making:  52 year old male type 2 diabetic has a history since midnight last night of facial swelling over left lower jaw.  No dental pain but does  have an ingrown hair on his cheek.  On exam he has marked lower facial swelling and tenderness.  Teeth are in good repair.  No oral lesions.  He has several risk factors: facial cellulitis, insulin requiring diabetes, and nausea.  I think he is a candidate for the cellulitis protocol.      Reuben Likes, MD 03/09/13 [phone]

## 2013-03-10 LAB — GLUCOSE, CAPILLARY: Glucose-Capillary: 184 mg/dL — ABNORMAL HIGH (ref 70–99)

## 2013-03-12 ENCOUNTER — Emergency Department (INDEPENDENT_AMBULATORY_CARE_PROVIDER_SITE_OTHER)
Admission: EM | Admit: 2013-03-12 | Discharge: 2013-03-12 | Disposition: A | Discharge status: Home / Self Care | Payer: Medicaid Other | Source: Home / Self Care | Attending: Family Medicine | Admitting: Family Medicine

## 2013-03-12 ENCOUNTER — Encounter (HOSPITAL_COMMUNITY): Payer: Self-pay | Admitting: Emergency Medicine

## 2013-03-12 DIAGNOSIS — L0201 Cutaneous abscess of face: Secondary | ICD-10-CM

## 2013-03-12 DIAGNOSIS — L03211 Cellulitis of face: Secondary | ICD-10-CM

## 2013-03-12 NOTE — ED Notes (Signed)
Pt is here for a f/u on abscess due to ingrown hair Seen here on 03/09/13 and was sent to ER for fluid antibiotics Reports it's getting better; taking antibiotics and tolerating well Alert w/no signs of acute distress.

## 2013-03-12 NOTE — ED Notes (Signed)
Clld pt once - no answer

## 2013-03-12 NOTE — ED Provider Notes (Signed)
CSN: [id number]     Arrival date & time 03/12/13  1051 History   First MD Initiated Contact with Patient 03/12/13 1224     Chief Complaint  Patient presents with  . Follow-up   (Consider location/radiation/quality/duration/timing/severity/associated sxs/prior Treatment) Patient is a 52 y.o. male presenting with wound check. The history is provided by the patient.  Wound Check This is a new problem. The current episode started more than 2 days ago (seen here then sent to ER on 11/5 for facial cellulitis, reports sx improving, taking meds as prescribed.). The problem has been rapidly improving.    Past Medical History  Diagnosis Date  . Diabetes mellitus without complication   . Plantar fasciitis, bilateral   . Arthritis   . Hypertension    Past Surgical History  Procedure Laterality Date  . Reduction of torsion of testis     No family history on file. History  Substance Use Topics  . Smoking status: Current Every Day Smoker -- 0.50 packs/day for 35 years    Types: Cigarettes  . Smokeless tobacco: Not on file  . Alcohol Use: Yes     Comment: Occasionally; has not had any etoh in 8 months    Review of Systems  Constitutional: Negative.   Skin: Positive for rash and wound.    Allergies  Review of patient's allergies indicates no known allergies.  Home Medications   Current Outpatient Rx  Name  Route  Sig  Dispense  Refill  . clindamycin (CLEOCIN) 150 MG capsule   Oral   Take 3 capsules (450 mg total) by mouth 3 (three) times daily.   90 capsule   0   . ARIPiprazole (ABILIFY) 2 MG tablet   Oral   Take 2 mg by mouth daily.         Marland Kitchen atenolol (TENORMIN) 50 MG tablet   Oral   Take 50 mg by mouth daily.         . DULoxetine (CYMBALTA) 60 MG capsule   Oral   Take 1 capsule (60 mg total) by mouth daily. For depression and pain.   30 capsule   0   . gabapentin (NEURONTIN) 400 MG capsule   Oral   Take 1 capsule (400 mg total) by mouth 3 (three) times daily.  Take for depression and anxiety, daily.   90 capsule   0   . insulin detemir (LEVEMIR) 100 UNIT/ML injection   Subcutaneous   Inject 55 Units into the skin 2 (two) times daily.         . insulin lispro (HUMALOG) 100 UNIT/ML injection   Subcutaneous   Inject 10 Units into the skin 3 (three) times daily before meals.         Marland Kitchen lisinopril (PRINIVIL,ZESTRIL) 10 MG tablet   Oral   Take 1 tablet (10 mg total) by mouth daily. For hypertension and renal protection.   30 tablet   0   . naproxen (NAPROSYN) 500 MG tablet   Oral   Take 500 mg by mouth 2 (two) times daily as needed for moderate pain.         Marland Kitchen ondansetron (ZOFRAN ODT) 4 MG disintegrating tablet   Oral   Take 1 tablet (4 mg total) by mouth every 8 (eight) hours as needed for nausea or vomiting.   20 tablet   0   . oxyCODONE-acetaminophen (PERCOCET/ROXICET) 5-325 MG per tablet   Oral   Take 1-2 tablets by mouth every 6 (six) hours as  needed for severe pain.   20 tablet   0    BP 125/81  Pulse 98  Temp(Src) 97.8 F (36.6 C) (Oral)  Resp 18  SpO2 96% Physical Exam  Nursing note and vitals reviewed. Constitutional: He is oriented to person, place, and time. He appears well-developed and well-nourished.  Neurological: He is alert and oriented to person, place, and time.  Skin: Skin is warm and dry.  Still with area of induration on left chin, no fluct or drainage.facial sts resolved, alert, in nad.    ED Course  Procedures (including critical care time) Labs Review Labs Reviewed - No data to display Imaging Review No results found.  EKG Interpretation     Ventricular Rate:    PR Interval:    QRS Duration:   QT Interval:    QTC Calculation:   R Axis:     Text Interpretation:              MDM      Linna Hoff, MD 03/12/13 1240

## 2013-03-27 ENCOUNTER — Emergency Department (HOSPITAL_COMMUNITY)
Admission: EM | Admit: 2013-03-27 | Discharge: 2013-03-27 | Disposition: A | Discharge status: Home / Self Care | Payer: PRIVATE HEALTH INSURANCE | Attending: Emergency Medicine | Admitting: Emergency Medicine

## 2013-03-27 ENCOUNTER — Encounter (HOSPITAL_COMMUNITY): Payer: Self-pay | Admitting: Registered Nurse

## 2013-03-27 DIAGNOSIS — R41840 Attention and concentration deficit: Secondary | ICD-10-CM | POA: Insufficient documentation

## 2013-03-27 DIAGNOSIS — F329 Major depressive disorder, single episode, unspecified: Secondary | ICD-10-CM | POA: Diagnosis present

## 2013-03-27 DIAGNOSIS — F39 Unspecified mood [affective] disorder: Secondary | ICD-10-CM | POA: Insufficient documentation

## 2013-03-27 DIAGNOSIS — Z79899 Other long term (current) drug therapy: Secondary | ICD-10-CM | POA: Insufficient documentation

## 2013-03-27 DIAGNOSIS — F17200 Nicotine dependence, unspecified, uncomplicated: Secondary | ICD-10-CM | POA: Insufficient documentation

## 2013-03-27 DIAGNOSIS — F3289 Other specified depressive episodes: Secondary | ICD-10-CM | POA: Insufficient documentation

## 2013-03-27 DIAGNOSIS — E119 Type 2 diabetes mellitus without complications: Secondary | ICD-10-CM | POA: Insufficient documentation

## 2013-03-27 DIAGNOSIS — M129 Arthropathy, unspecified: Secondary | ICD-10-CM | POA: Insufficient documentation

## 2013-03-27 DIAGNOSIS — Z794 Long term (current) use of insulin: Secondary | ICD-10-CM | POA: Insufficient documentation

## 2013-03-27 DIAGNOSIS — R630 Anorexia: Secondary | ICD-10-CM | POA: Insufficient documentation

## 2013-03-27 DIAGNOSIS — I10 Essential (primary) hypertension: Secondary | ICD-10-CM | POA: Insufficient documentation

## 2013-03-27 DIAGNOSIS — F411 Generalized anxiety disorder: Secondary | ICD-10-CM | POA: Insufficient documentation

## 2013-03-27 DIAGNOSIS — R5381 Other malaise: Secondary | ICD-10-CM | POA: Insufficient documentation

## 2013-03-27 DIAGNOSIS — R45851 Suicidal ideations: Secondary | ICD-10-CM | POA: Insufficient documentation

## 2013-03-27 LAB — COMPREHENSIVE METABOLIC PANEL
AST: 21 U/L (ref 0–37)
Albumin: 3.7 g/dL (ref 3.5–5.2)
Alkaline Phosphatase: 88 U/L (ref 39–117)
Chloride: 96 mEq/L (ref 96–112)
Glucose, Bld: 262 mg/dL — ABNORMAL HIGH (ref 70–99)
Potassium: 4.5 mEq/L (ref 3.5–5.1)
Sodium: 131 mEq/L — ABNORMAL LOW (ref 135–145)
Total Bilirubin: 0.5 mg/dL (ref 0.3–1.2)

## 2013-03-27 LAB — CBC
Hemoglobin: 16.4 g/dL (ref 13.0–17.0)
MCH: 31.9 pg (ref 26.0–34.0)
Platelets: 261 10*3/uL (ref 150–400)
RDW: 14.8 % (ref 11.5–15.5)
WBC: 8.2 10*3/uL (ref 4.0–10.5)

## 2013-03-27 LAB — GLUCOSE, CAPILLARY
Glucose-Capillary: 229 mg/dL — ABNORMAL HIGH (ref 70–99)
Glucose-Capillary: 276 mg/dL — ABNORMAL HIGH (ref 70–99)

## 2013-03-27 LAB — ACETAMINOPHEN LEVEL: Acetaminophen (Tylenol), Serum: <15 ug/mL (ref 10–30)

## 2013-03-27 LAB — SALICYLATE LEVEL: Salicylate Lvl: <2 mg/dL — ABNORMAL LOW (ref 2.8–20.0)

## 2013-03-27 MED ORDER — LISINOPRIL 10 MG PO TABS
10.0000 mg | ORAL_TABLET | Freq: Every day | ORAL | Status: DC
  Filled 2013-03-27: qty 1

## 2013-03-27 MED ORDER — ATENOLOL 50 MG PO TABS
50.0000 mg | ORAL_TABLET | Freq: Every day | ORAL | Status: DC
  Filled 2013-03-27: qty 1

## 2013-03-27 MED ORDER — DULOXETINE HCL 60 MG PO CPEP
60.0000 mg | ORAL_CAPSULE | Freq: Every day | ORAL | Status: DC
  Filled 2013-03-27: qty 1

## 2013-03-27 MED ORDER — GABAPENTIN 400 MG PO CAPS
400.0000 mg | ORAL_CAPSULE | Freq: Three times a day (TID) | ORAL | Status: DC
  Administered 2013-03-27: 400 mg via ORAL
  Filled 2013-03-27 (×2): qty 1

## 2013-03-27 MED ORDER — ARIPIPRAZOLE 2 MG PO TABS
2.0000 mg | ORAL_TABLET | Freq: Every day | ORAL | Status: DC
  Filled 2013-03-27: qty 1

## 2013-03-27 MED ORDER — ONDANSETRON HCL 4 MG PO TABS: 4.0000 mg | ORAL_TABLET | Freq: Three times a day (TID) | ORAL | Status: DC | PRN

## 2013-03-27 MED ORDER — NICOTINE 21 MG/24HR TD PT24
21.0000 mg | MEDICATED_PATCH | Freq: Every day | TRANSDERMAL | Status: DC
  Administered 2013-03-27: 21 mg via TRANSDERMAL
  Filled 2013-03-27: qty 1

## 2013-03-27 MED ORDER — INSULIN DETEMIR 100 UNIT/ML ~~LOC~~ SOLN
55.0000 [IU] | Freq: Two times a day (BID) | SUBCUTANEOUS | Status: DC
  Administered 2013-03-27: 55 [IU] via SUBCUTANEOUS
  Filled 2013-03-27 (×2): qty 0.55

## 2013-03-27 MED ORDER — INSULIN ASPART 100 UNIT/ML ~~LOC~~ SOLN
0.0000 [IU] | Freq: Three times a day (TID) | SUBCUTANEOUS | Status: DC
  Administered 2013-03-27: 3 [IU] via SUBCUTANEOUS
  Filled 2013-03-27: qty 1

## 2013-03-27 NOTE — ED Notes (Signed)
Pt reports he was not SI this morning, but then became that way. Pt reports he "always has a plan to kill himself by hanging himself with the vent duct". Pt reports he has been homeless and staying at the [facility] since the last time he tried to kill himself in August. Denies access to weapons.

## 2013-03-27 NOTE — ED Notes (Signed)
Shuvon NP into see 

## 2013-03-27 NOTE — ED Notes (Signed)
TTS into see 

## 2013-03-27 NOTE — ED Notes (Signed)
Dr I knapp in to see

## 2013-03-27 NOTE — Consult Note (Signed)
[facility] Face-to-Face Psychiatry Consult   Reason for Consult:  Major Depressive Disorder Referring Physician:  EDP  Eric Shaw is an 52 y.o. male.  Assessment: AXIS I:  Major Depressive Disorder AXIS II:  Deferred AXIS III:   Past Medical History  Diagnosis Date  . Diabetes mellitus without complication   . Plantar fasciitis, bilateral   . Arthritis   . Hypertension    AXIS IV:  housing problems and other psychosocial or environmental problems AXIS V:  51-60 moderate symptoms  Plan:  No evidence of imminent risk to self or others at present.   Supportive therapy provided about ongoing stressors. Discussed crisis plan, support from social network, calling 911, coming to the Emergency Department, and calling Suicide Hotline.  Subjective:   Eric Shaw is a 52 y.o. male patient.  HPI:  Patient states that he is here because "I woke up this morning depressed feeling like no one loved me and said to my self if I killed my self I would do it on Thanks giving.  I know I won't kill my self cause I want to live. I just saw my psychiatrist 3 days a go and told him about the suicidal thoughts and he increased my Cymbalta.  I was just feeling that way this morning cause I have a friend that has to work on Thanksgiving and I have another that won't be able to see me cause he was invited to his girlfriends for Thanksgiving and I felt like I would be alone."  Patient states that he is living at the [employer] and knows that he has friends that will be present for Thanksgiving.  "I don't want to die.  I am suppose to start heading my own support group tomorrow.  I just needed time to think about things. I don't need to go in the hospital.  Dr. Tiburcio Pea, Olegario Messier and Byrd Hesselbach are pretty good; that's my psychiatrist and therapists.  I will see my therapist the first week of December but I won't see Dr. Tiburcio Pea for 30 days; but when I call him like this last time he usually works me in some way.  I feel  good about going home and don't want to be admitted.  I think if I am admitted it will only increase my anxiety.  I don't know why this happened today; I just came in just in case but I have had time to think about things and I am feeling fine now.  I am ready to go home."  Patient states that he is compliant with his medications.  At this time patient denies suicidal/homicidal ideation, psychosis, and paranoia.  Patient states that he is able to contract for safety and states that he starts to feel suicidal "I know to call a friend, I will come back here. I won't act on the thoughts.  I really to want to live and I know I will be fine."     Past Psychiatric History: Past Medical History  Diagnosis Date  . Diabetes mellitus without complication   . Plantar fasciitis, bilateral   . Arthritis   . Hypertension     reports that he has been smoking Cigarettes.  He has a 17.5 pack-year smoking history. He does not have any smokeless tobacco history on file. He reports that he drinks alcohol. He reports that he does not use illicit drugs. No family history on file. Family History Substance Abuse: No Family Supports: Yes, List: (friend support) Living Arrangements: Other (  Comment) Counselling psychologist) Can pt return to current living arrangement?: Yes Abuse/Neglect [facility]) Physical Abuse: Denies Verbal Abuse: Denies Sexual Abuse: Denies Allergies:  No Known Allergies  ACT Assessment Complete:  Yes:    Educational Status    Risk to Self: Risk to self Suicidal Ideation: Yes-Currently Present Suicidal Intent: Yes-Currently Present Is patient at risk for suicide?: Yes Suicidal Plan?: Yes-Currently Present Specify Current Suicidal Plan: hang self Access to Means: Yes Specify Access to Suicidal Means: can get a rope and hang self What has been your use of drugs/alcohol within the last 12 months?: drinks 1x month Previous Attempts/Gestures: Yes How many times?: 1 Other Self Harm Risks: na Triggers  for Past Attempts: Unpredictable Intentional Self Injurious Behavior: None Family Suicide History: No Recent stressful life event(s): Conflict (Comment);Financial Problems;Other (Comment) (homeless) Persecutory voices/beliefs?: No Depression: Yes Depression Symptoms: Isolating;Fatigue;Guilt;Loss of interest in usual pleasures;Feeling worthless/self pity;Feeling angry/irritable Substance abuse history and/or treatment for substance abuse?: No Suicide prevention information given to non-admitted patients: Not applicable  Risk to Others: Risk to Others Homicidal Ideation: No Thoughts of Harm to Others: No Current Homicidal Intent: No Current Homicidal Plan: No Access to Homicidal Means: No Identified Victim: na History of harm to others?: No Assessment of Violence: None Noted Violent Behavior Description: cooperative Does patient have access to weapons?: No Criminal Charges Pending?: No Does patient have a court date: Yes (had one 01-26-13 for [employer]) Court Date: 01/26/13  Abuse: Abuse/Neglect Assessment (Assessment to be complete while patient is alone) Physical Abuse: Denies Verbal Abuse: Denies Sexual Abuse: Denies Exploitation of patient/patient's resources: Denies Self-Neglect: Denies  Prior Inpatient Therapy: Prior Inpatient Therapy Prior Inpatient Therapy: Yes Prior Therapy Dates: 12/2012 Prior Therapy Facilty/Provider(s): [facility] Reason for Treatment: SI  Prior Outpatient Therapy: Prior Outpatient Therapy Prior Outpatient Therapy: Yes Prior Therapy Dates: 2014 Prior Therapy Facilty/Provider(s): [facility] Reason for Treatment: med mgt  Additional Information: Additional Information 1:1 In Past 12 Months?: No CIRT Risk: No Elopement Risk: No Does patient have medical clearance?: Yes                  Objective: Blood pressure 118/82, pulse 80, temperature 98 F (36.7 C), temperature source Oral, resp. rate 16, SpO2 100.00%.There is no weight on file to calculate  BMI. Results for orders placed during the hospital encounter of 03/27/13 (from the past 72 hour(s))  ACETAMINOPHEN LEVEL     Status: None   Collection Time    03/27/13 11:47 AM      Result Value Range   Acetaminophen (Tylenol), Serum <15.0  10 - 30 ug/mL   Comment:            THERAPEUTIC CONCENTRATIONS VARY     SIGNIFICANTLY. A RANGE OF 10-30     ug/mL MAY BE AN EFFECTIVE     CONCENTRATION FOR MANY PATIENTS.     HOWEVER, SOME ARE BEST TREATED     AT CONCENTRATIONS OUTSIDE THIS     RANGE.     ACETAMINOPHEN CONCENTRATIONS     >150 ug/mL AT 4 HOURS AFTER     INGESTION AND >50 ug/mL AT 12     HOURS AFTER INGESTION ARE     OFTEN ASSOCIATED WITH TOXIC     REACTIONS.  CBC     Status: None   Collection Time    03/27/13 11:47 AM      Result Value Range   WBC 8.2  4.0 - 10.5 K/uL   RBC 5.14  4.22 - 5.81 MIL/uL  Hemoglobin 16.4  13.0 - 17.0 g/dL   HCT 16.1  09.6 - 04.5 %   MCV 89.7  78.0 - 100.0 fL   MCH 31.9  26.0 - 34.0 pg   MCHC 35.6  30.0 - 36.0 g/dL   RDW 40.9  81.1 - 91.4 %   Platelets 261  150 - 400 K/uL  COMPREHENSIVE METABOLIC PANEL     Status: Abnormal   Collection Time    03/27/13 11:47 AM      Result Value Range   Sodium 131 (*) 135 - 145 mEq/L   Potassium 4.5  3.5 - 5.1 mEq/L   Chloride 96  96 - 112 mEq/L   CO2 25  19 - 32 mEq/L   Glucose, Bld 262 (*) 70 - 99 mg/dL   BUN 15  6 - 23 mg/dL   Creatinine, Ser 7.82  0.50 - 1.35 mg/dL   Calcium 9.8  8.4 - 95.6 mg/dL   Total Protein 7.8  6.0 - 8.3 g/dL   Albumin 3.7  3.5 - 5.2 g/dL   AST 21  0 - 37 U/L   ALT 32  0 - 53 U/L   Alkaline Phosphatase 88  39 - 117 U/L   Total Bilirubin 0.5  0.3 - 1.2 mg/dL   GFR calc non Af Amer >90  >90 mL/min   GFR calc Af Amer >90  >90 mL/min   Comment: (NOTE)     The eGFR has been calculated using the CKD EPI equation.     This calculation has not been validated in all clinical situations.     eGFR's persistently <90 mL/min signify possible Chronic Kidney     Disease.  ETHANOL      Status: None   Collection Time    03/27/13 11:47 AM      Result Value Range   Alcohol, Ethyl (B) <11  0 - 11 mg/dL   Comment:            LOWEST DETECTABLE LIMIT FOR     SERUM ALCOHOL IS 11 mg/dL     FOR MEDICAL PURPOSES ONLY  SALICYLATE LEVEL     Status: Abnormal   Collection Time    03/27/13 11:47 AM      Result Value Range   Salicylate Lvl <2.0 (*) 2.8 - 20.0 mg/dL  GLUCOSE, CAPILLARY     Status: Abnormal   Collection Time    03/27/13 12:03 PM      Result Value Range   Glucose-Capillary 246 (*) 70 - 99 mg/dL   Comment 1 Notify RN    GLUCOSE, CAPILLARY     Status: Abnormal   Collection Time    03/27/13 12:55 PM      Result Value Range   Glucose-Capillary 229 (*) 70 - 99 mg/dL    Current Facility-Administered Medications  Medication Dose Route Frequency Provider Last Rate Last Dose  . ARIPiprazole (ABILIFY) tablet 2 mg  2 mg Oral Daily Glynn Octave, MD      . atenolol (TENORMIN) tablet 50 mg  50 mg Oral Daily Glynn Octave, MD      . DULoxetine (CYMBALTA) DR capsule 60 mg  60 mg Oral QHS Glynn Octave, MD      . gabapentin (NEURONTIN) capsule 400 mg  400 mg Oral TID Glynn Octave, MD   400 mg at 03/27/13 1334  . insulin aspart (novoLOG) injection 0-9 Units  0-9 Units Subcutaneous TID WC Glynn Octave, MD   3 Units at 03/27/13 1311  .  insulin detemir (LEVEMIR) injection 55 Units  55 Units Subcutaneous BID Glynn Octave, MD   55 Units at 03/27/13 1324  . lisinopril (PRINIVIL,ZESTRIL) tablet 10 mg  10 mg Oral Daily Glynn Octave, MD      . nicotine (NICODERM CQ - dosed in mg/24 hours) patch 21 mg  21 mg Transdermal Daily Glynn Octave, MD   21 mg at 03/27/13 1310  . ondansetron (ZOFRAN) tablet 4 mg  4 mg Oral Q8H PRN Glynn Octave, MD       Current Outpatient Prescriptions  Medication Sig Dispense Refill  . ARIPiprazole (ABILIFY) 5 MG tablet Take 5 mg by mouth every morning.      Marland Kitchen atenolol (TENORMIN) 50 MG tablet Take 50 mg by mouth daily.      . DULoxetine  (CYMBALTA) 30 MG capsule Take 90 mg by mouth every evening.      . gabapentin (NEURONTIN) 300 MG capsule Take 600 mg by mouth 3 (three) times daily.      . insulin detemir (LEVEMIR) 100 UNIT/ML injection Inject 55 Units into the skin 2 (two) times daily.      . insulin lispro (HUMALOG) 100 UNIT/ML injection Inject 0-18 Units into the skin 3 (three) times daily after meals. 15 minutes after meals      . lisinopril (PRINIVIL,ZESTRIL) 10 MG tablet Take 1 tablet (10 mg total) by mouth daily. For hypertension and renal protection.  30 tablet  0  . naproxen (NAPROSYN) 500 MG tablet Take 500 mg by mouth 2 (two) times daily as needed for moderate pain.        Psychiatric Specialty Exam:     Blood pressure 118/82, pulse 80, temperature 98 F (36.7 C), temperature source Oral, resp. rate 16, SpO2 100.00%.There is no weight on file to calculate BMI.  General Appearance: Casual and Fairly Groomed  Eye Contact::  Good  Speech:  Clear and Coherent and Normal Rate  Volume:  Normal  Mood:  Depressed  Affect:  Appropriate and Depressed  Thought Process:  Circumstantial, Coherent, Goal Directed and Logical  Orientation:  Full (Time, Place, and Person)  Thought Content:  "I don't want to die"  Suicidal Thoughts:  No  Homicidal Thoughts:  No  Memory:  Immediate;   Good Recent;   Good Remote;   Good  Judgement:  Fair  Insight:  Good and Present  Psychomotor Activity:  Normal  Concentration:  Good  Recall:  Good  Akathisia:  No  Handed:  Right  AIMS (if indicated):     Assets:  Communication Skills Desire for Improvement Social Support  Sleep:      Face to face interview/consult and then consulted with Dr. Lolly Mustache  Treatment Plan Summary: Outpatient   Disposition:  Discharge home.  Patient to follow up with outpatient primary providers.   Assunta Found, FNP-BC 03/27/2013 3:32 PM  I agreed with the findings, treatment and disposition plan of this patient. Kathryne Sharper, MD

## 2013-03-27 NOTE — ED Notes (Signed)
Pt states has been seen by [facility] before, seeing counselor, last seen about 3 weeks ago, saw doctor few days ago and medication increased, states about 45 minutes began feeling very sad and depressed, states thinking about killing himself on Thanksgiving, pt tearful and crying

## 2013-03-27 NOTE — ED Notes (Signed)
Written dc instructions reviewed w/ pt.  Pt verbalized understanding.  Pt denies si/hi/avh on dc and encouraged to contact his therapist Monday for follow up, take his medications as directed and return/seek treatment for any return of suicidal/homicidal thoughts or urges.  Pt verbalized understanding.

## 2013-03-27 NOTE — ED Provider Notes (Signed)
CSN: [id number]     Arrival date & time 03/27/13  1125 History   First MD Initiated Contact with Patient 03/27/13 1140     Chief Complaint  Patient presents with  . Medical Clearance   (Consider location/radiation/quality/duration/timing/severity/associated sxs/prior Treatment) HPI Comments: Patient presents with sudden onset of depression, tearfulness and suicidal thoughts. He states he wants to hang himself from a bridge. He was to kill himself on Thanksgiving. He has a history of depression his medications were recently increased. He denies any homicidal thoughts. He denies any hallucinations. He denies any alcohol or drug abuse. Has a history of diabetes, plantar fasciitis, previous suicide attempts.  The history is provided by the patient and the police.    Past Medical History  Diagnosis Date  . Diabetes mellitus without complication   . Plantar fasciitis, bilateral   . Arthritis   . Hypertension    Past Surgical History  Procedure Laterality Date  . Reduction of torsion of testis     No family history on file. History  Substance Use Topics  . Smoking status: Current Every Day Smoker -- 0.50 packs/day for 35 years    Types: Cigarettes  . Smokeless tobacco: Not on file  . Alcohol Use: Yes     Comment: Occasionally; has not had any etoh in 8 months    Review of Systems  Constitutional: Positive for activity change and appetite change.  HENT: Negative for rhinorrhea.   Respiratory: Negative for cough, chest tightness and shortness of breath.   Cardiovascular: Negative for chest pain.  Gastrointestinal: Negative for nausea, vomiting and abdominal pain.  Genitourinary: Negative for dysuria and hematuria.  Musculoskeletal: Positive for arthralgias and myalgias. Negative for back pain.  Skin: Negative for rash.  Neurological: Negative for dizziness, weakness and headaches.  Psychiatric/Behavioral: Positive for suicidal ideas, self-injury, dysphoric mood and decreased  concentration. The patient is nervous/anxious.   A complete 10 system review of systems was obtained and all systems are negative except as noted in the HPI and PMH.    Allergies  Review of patient's allergies indicates no known allergies.  Home Medications   Current Outpatient Rx  Name  Route  Sig  Dispense  Refill  . ARIPiprazole (ABILIFY) 5 MG tablet   Oral   Take 5 mg by mouth every morning.         Marland Kitchen atenolol (TENORMIN) 50 MG tablet   Oral   Take 50 mg by mouth daily.         . DULoxetine (CYMBALTA) 30 MG capsule   Oral   Take 90 mg by mouth every evening.         . gabapentin (NEURONTIN) 300 MG capsule   Oral   Take 600 mg by mouth 3 (three) times daily.         . insulin detemir (LEVEMIR) 100 UNIT/ML injection   Subcutaneous   Inject 55 Units into the skin 2 (two) times daily.         . insulin lispro (HUMALOG) 100 UNIT/ML injection   Subcutaneous   Inject 0-18 Units into the skin 3 (three) times daily after meals. 15 minutes after meals         . lisinopril (PRINIVIL,ZESTRIL) 10 MG tablet   Oral   Take 1 tablet (10 mg total) by mouth daily. For hypertension and renal protection.   30 tablet   0   . naproxen (NAPROSYN) 500 MG tablet   Oral   Take 500 mg by mouth  2 (two) times daily as needed for moderate pain.          BP 118/82  Pulse 80  Temp(Src) 98 F (36.7 C) (Oral)  Resp 16  SpO2 100% Physical Exam  Constitutional: He is oriented to person, place, and time. He appears well-developed and well-nourished. No distress.  HENT:  Head: Normocephalic and atraumatic.  Mouth/Throat: Oropharynx is clear and moist. No oropharyngeal exudate.  Eyes: Conjunctivae and EOM are normal. Pupils are equal, round, and reactive to light.  Neck: Normal range of motion. Neck supple.  Cardiovascular: Normal rate, regular rhythm and normal heart sounds.   No murmur heard. Pulmonary/Chest: Effort normal and breath sounds normal. No respiratory distress.   Abdominal: Soft. There is no tenderness. There is no rebound and no guarding.  Musculoskeletal: Normal range of motion. He exhibits no edema and no tenderness.  No open wounds, dry skin, +2 DP and PT pulses  Neurological: He is alert and oriented to person, place, and time. No cranial nerve deficit. He exhibits normal muscle tone. Coordination normal.  Skin: Skin is warm.  Psychiatric:  Tearful, anxious    ED Course  Procedures (including critical care time) Labs Review Labs Reviewed  COMPREHENSIVE METABOLIC PANEL - Abnormal; Notable for the following:    Sodium 131 (*)    Glucose, Bld 262 (*)    All other components within normal limits  SALICYLATE LEVEL - Abnormal; Notable for the following:    Salicylate Lvl <2.0 (*)    All other components within normal limits  GLUCOSE, CAPILLARY - Abnormal; Notable for the following:    Glucose-Capillary 246 (*)    All other components within normal limits  GLUCOSE, CAPILLARY - Abnormal; Notable for the following:    Glucose-Capillary 229 (*)    All other components within normal limits  ACETAMINOPHEN LEVEL  CBC  ETHANOL  URINE RAPID DRUG SCREEN (HOSP PERFORMED)   Imaging Review No results found.  EKG Interpretation   None       MDM   1. Suicidal ideation    Worsening depression with suicidal thoughts and planned to hang self. Vital stable. No distress.  Labs show hyperglycemia. No evidence of DKA. Labs appear to be at baseline. No evidence of infection or vascular compromise to the feet.  Patient appears stable for psychiatric evaluation. Holding orders placed.   Glynn Octave, MD 03/27/13 1530

## 2013-03-27 NOTE — BH Assessment (Signed)
Assessment Note  Eric Shaw is an 52 y.o. male.   Presenting Issue  Pt brought self to ED due to suicidal thoughts about hanging self on or before Thanksgiving Day.  Pt reports he was concerned because "Hey, I have had this plan for a long time.  It's nothing new but there is always a trigger or something and today, nothing, it just came out of the blue."    Pt denies homicidal, AVH and SA related behaviors, issues or past hx.  Pt does report consuming alcohol 1x per month but it's not a daily or weekly occurrence.    Pt is on medications and nurse is aware   When asked if pt can contract for safety pt responds "I think I can.  I don't want to die or I wouldn't be here.  I would have done it already.  I want help but I am tired of seeing doctors too."  Pt reports he was at [facility] and then transitioned to [facility].  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    Pt unsure if he wants to be in ED at this time.  TTS explained to pt with his plan to hang self it is unlikely he will be leaving today.    MSE by TTS:    Good eye contact, speech clear, polite, interacted well with TTS, Ox3,   Recommendation  Pt will be evaluated by psychiatry to determine dispo.  Pt reports he is not suicidal as of 1245 but upon arrival he was and had a plan.  Thanksgiving appears to be a trigger even though pt does not verbalize it as one.  Inpatient to a 500 hall bed appears to be appropriate based on pt liability.    Axis I: Major Depression, Recurrent severe Axis II: Deferred Axis III:  Past Medical History  Diagnosis Date  . Diabetes mellitus without complication   . Plantar fasciitis, bilateral   . Arthritis   . Hypertension    Axis IV: housing problems, other psychosocial or environmental problems, problems related to legal system/crime, problems related to social environment and problems with primary  support group Axis V: 41-50 serious symptoms  Past Medical History:  Past Medical History  Diagnosis Date  . Diabetes mellitus without complication   . Plantar fasciitis, bilateral   . Arthritis   . Hypertension     Past Surgical History  Procedure Laterality Date  . Reduction of torsion of testis      Family History: No family history on file.  Social History:  reports that he has been smoking Cigarettes.  He has a 17.5 pack-year smoking history. He does not have any smokeless tobacco history on file. He reports that he drinks alcohol. He reports that he does not use illicit drugs.  Additional Social History:  Alcohol / Drug Use Pain Medications: na Prescriptions: na Over the Counter: na History of alcohol / drug use?: No history of alcohol / drug abuse Longest period of sobriety (when/how long): na  CIWA: CIWA-Ar BP: 118/82 mmHg Pulse Rate: 80 COWS:    Allergies: No Known Allergies  Home Medications:  (Not in a hospital admission)  OB/GYN Status:  No LMP for male patient.  General Assessment Data Location of Assessment: [facility] ED Is this a Tele or Face-to-Face Assessment?: Face-to-Face Is this an Initial Assessment or a Re-assessment for this encounter?: Initial Assessment Living Arrangements: Other (  Comment) Counselling psychologist) Can pt return to current living arrangement?: Yes Admission Status: Voluntary Is patient capable of signing voluntary admission?: Yes Transfer from: Acute Hospital Referral Source: MD  Medical Screening Exam [facility] Walk-in ONLY) Medical Exam completed: Yes  [facility] Crisis Care Plan Living Arrangements: Other (Comment) Counselling psychologist)  Education Status Is patient currently in school?: No Current Grade: na Highest grade of school patient has completed: na Name of school: na Contact person: na  Risk to self Suicidal Ideation: Yes-Currently Present Suicidal Intent: Yes-Currently Present Is patient at risk for suicide?: Yes Suicidal Plan?:  Yes-Currently Present Specify Current Suicidal Plan: hang self Access to Means: Yes Specify Access to Suicidal Means: can get a rope and hang self What has been your use of drugs/alcohol within the last 12 months?: drinks 1x month Previous Attempts/Gestures: Yes How many times?: 1 Other Self Harm Risks: na Triggers for Past Attempts: Unpredictable Intentional Self Injurious Behavior: None Family Suicide History: No Recent stressful life event(s): Conflict (Comment);Financial Problems;Other (Comment) (homeless) Persecutory voices/beliefs?: No Depression: Yes Depression Symptoms: Isolating;Fatigue;Guilt;Loss of interest in usual pleasures;Feeling worthless/self pity;Feeling angry/irritable Substance abuse history and/or treatment for substance abuse?: No Suicide prevention information given to non-admitted patients: Not applicable  Risk to Others Homicidal Ideation: No Thoughts of Harm to Others: No Current Homicidal Intent: No Current Homicidal Plan: No Access to Homicidal Means: No Identified Victim: na History of harm to others?: No Assessment of Violence: None Noted Violent Behavior Description: cooperative Does patient have access to weapons?: No Criminal Charges Pending?: No Does patient have a court date: Yes (had one 01-26-13 for [facility] [employer] [facility]) Court Date: 01/26/13  Psychosis Hallucinations: None noted Delusions: None noted  Mental Status Report Appear/Hygiene: Disheveled Eye Contact: Good Motor Activity: Unremarkable Speech: Logical/coherent Level of Consciousness: Alert Mood: Depressed;Ashamed/humiliated;Sad;Worthless, low self-esteem Affect: Anxious;Depressed;Sad Anxiety Level: Moderate Thought Processes: Coherent Judgement: Impaired Orientation: Person;Place;Situation;Appropriate for developmental age Obsessive Compulsive Thoughts/Behaviors: Minimal  Cognitive Functioning Concentration: Decreased Memory: Recent Intact;Remote Intact IQ: Average Insight:  Fair Impulse Control: Poor Appetite: Fair Weight Loss: 0 Weight Gain: 0 Sleep: No Change Total Hours of Sleep: 7 Vegetative Symptoms: None  ADLScreening [facility] Assessment Services) Patient's cognitive ability adequate to safely complete daily activities?: Yes Patient able to express need for assistance with ADLs?: Yes Independently performs ADLs?: Yes (appropriate for developmental age)  Prior Inpatient Therapy Prior Inpatient Therapy: Yes Prior Therapy Dates: 12/2012 Prior Therapy Facilty/Provider(s): [facility] Reason for Treatment: SI  Prior Outpatient Therapy Prior Outpatient Therapy: Yes Prior Therapy Dates: 2014 Prior Therapy Facilty/Provider(s): [facility] Reason for Treatment: med mgt  ADL Screening (condition at time of admission) Patient's cognitive ability adequate to safely complete daily activities?: Yes Is the patient deaf or have difficulty hearing?: No Does the patient have difficulty seeing, even when wearing glasses/contacts?: No Does the patient have difficulty concentrating, remembering, or making decisions?: No Patient able to express need for assistance with ADLs?: Yes Does the patient have difficulty dressing or bathing?: No Independently performs ADLs?: Yes (appropriate for developmental age) Does the patient have difficulty walking or climbing stairs?: No Weakness of Legs: None Weakness of Arms/Hands: None  Home Assistive Devices/Equipment Home Assistive Devices/Equipment: None  Therapy Consults (therapy consults require a physician order) PT Evaluation Needed: No OT Evalulation Needed: No SLP Evaluation Needed: No Abuse/Neglect Assessment (Assessment to be complete while patient is alone) Physical Abuse: Denies Verbal Abuse: Denies Sexual Abuse: Denies Exploitation of patient/patient's resources: Denies Self-Neglect: Denies Values / Beliefs Cultural Requests During Hospitalization: None Spiritual Requests During Hospitalization:  None Consults Spiritual Care Consult Needed: No Social Work Consult Needed: No Merchant navy officer (For Healthcare) Advance Directive: Patient does not have advance directive Pre-existing out of facility DNR order (yellow form or pink MOST form): No    Additional Information 1:1 In Past 12 Months?: No CIRT Risk: No Elopement Risk: No Does patient have medical clearance?: Yes     Disposition:  Disposition Initial Assessment Completed for this Encounter: Yes Disposition of Patient: Inpatient treatment program Type of inpatient treatment program: Adult  On Site Evaluation by:   Reviewed with Physician:    Macon Large 03/27/2013 12:49 PM

## 2014-06-19 ENCOUNTER — Emergency Department (HOSPITAL_COMMUNITY): Payer: Self-pay

## 2014-06-19 ENCOUNTER — Encounter (HOSPITAL_COMMUNITY): Payer: Self-pay

## 2014-06-19 ENCOUNTER — Emergency Department (INDEPENDENT_AMBULATORY_CARE_PROVIDER_SITE_OTHER)
Admission: EM | Admit: 2014-06-19 | Discharge: 2014-06-19 | Disposition: A | Discharge status: Nursing Home (Includes ICF) | Payer: Medicare Other | Source: Home / Self Care | Attending: Emergency Medicine | Admitting: Emergency Medicine

## 2014-06-19 ENCOUNTER — Encounter (HOSPITAL_COMMUNITY): Payer: Self-pay | Admitting: Emergency Medicine

## 2014-06-19 ENCOUNTER — Inpatient Hospital Stay (HOSPITAL_COMMUNITY)
Admission: EM | Admit: 2014-06-19 | Discharge: 2014-07-06 | DRG: 853 | Disposition: A | Discharge status: Home Health | Payer: Medicare Other | Attending: Internal Medicine | Admitting: Internal Medicine

## 2014-06-19 DIAGNOSIS — E1165 Type 2 diabetes mellitus with hyperglycemia: Secondary | ICD-10-CM | POA: Diagnosis not present

## 2014-06-19 DIAGNOSIS — A412 Sepsis due to unspecified staphylococcus: Secondary | ICD-10-CM | POA: Diagnosis present

## 2014-06-19 DIAGNOSIS — R6520 Severe sepsis without septic shock: Secondary | ICD-10-CM | POA: Diagnosis present

## 2014-06-19 DIAGNOSIS — T884XXA Failed or difficult intubation, initial encounter: Secondary | ICD-10-CM | POA: Diagnosis not present

## 2014-06-19 DIAGNOSIS — L03221 Cellulitis of neck: Secondary | ICD-10-CM

## 2014-06-19 DIAGNOSIS — J95821 Acute postprocedural respiratory failure: Secondary | ICD-10-CM | POA: Diagnosis not present

## 2014-06-19 DIAGNOSIS — E860 Dehydration: Secondary | ICD-10-CM | POA: Diagnosis present

## 2014-06-19 DIAGNOSIS — D899 Disorder involving the immune mechanism, unspecified: Secondary | ICD-10-CM | POA: Diagnosis present

## 2014-06-19 DIAGNOSIS — E870 Hyperosmolality and hypernatremia: Secondary | ICD-10-CM | POA: Diagnosis present

## 2014-06-19 DIAGNOSIS — F329 Major depressive disorder, single episode, unspecified: Secondary | ICD-10-CM | POA: Diagnosis present

## 2014-06-19 DIAGNOSIS — E871 Hypo-osmolality and hyponatremia: Secondary | ICD-10-CM | POA: Diagnosis not present

## 2014-06-19 DIAGNOSIS — L0211 Cutaneous abscess of neck: Secondary | ICD-10-CM | POA: Diagnosis not present

## 2014-06-19 DIAGNOSIS — R339 Retention of urine, unspecified: Secondary | ICD-10-CM | POA: Diagnosis not present

## 2014-06-19 DIAGNOSIS — L0291 Cutaneous abscess, unspecified: Secondary | ICD-10-CM | POA: Diagnosis not present

## 2014-06-19 DIAGNOSIS — E1142 Type 2 diabetes mellitus with diabetic polyneuropathy: Secondary | ICD-10-CM | POA: Insufficient documentation

## 2014-06-19 DIAGNOSIS — Z794 Long term (current) use of insulin: Secondary | ICD-10-CM

## 2014-06-19 DIAGNOSIS — Z6833 Body mass index (BMI) 33.0-33.9, adult: Secondary | ICD-10-CM

## 2014-06-19 DIAGNOSIS — R739 Hyperglycemia, unspecified: Secondary | ICD-10-CM

## 2014-06-19 DIAGNOSIS — B9561 Methicillin susceptible Staphylococcus aureus infection as the cause of diseases classified elsewhere: Secondary | ICD-10-CM | POA: Diagnosis present

## 2014-06-19 DIAGNOSIS — E780 Pure hypercholesterolemia: Secondary | ICD-10-CM | POA: Diagnosis present

## 2014-06-19 DIAGNOSIS — J9601 Acute respiratory failure with hypoxia: Secondary | ICD-10-CM

## 2014-06-19 DIAGNOSIS — IMO0002 Reserved for concepts with insufficient information to code with codable children: Secondary | ICD-10-CM | POA: Insufficient documentation

## 2014-06-19 DIAGNOSIS — R338 Other retention of urine: Secondary | ICD-10-CM

## 2014-06-19 DIAGNOSIS — M726 Necrotizing fasciitis: Secondary | ICD-10-CM | POA: Diagnosis present

## 2014-06-19 DIAGNOSIS — L0390 Cellulitis, unspecified: Secondary | ICD-10-CM | POA: Diagnosis not present

## 2014-06-19 DIAGNOSIS — Z4659 Encounter for fitting and adjustment of other gastrointestinal appliance and device: Secondary | ICD-10-CM

## 2014-06-19 DIAGNOSIS — J9602 Acute respiratory failure with hypercapnia: Secondary | ICD-10-CM

## 2014-06-19 DIAGNOSIS — I10 Essential (primary) hypertension: Secondary | ICD-10-CM | POA: Diagnosis present

## 2014-06-19 DIAGNOSIS — Z978 Presence of other specified devices: Secondary | ICD-10-CM

## 2014-06-19 DIAGNOSIS — E872 Acidosis: Secondary | ICD-10-CM | POA: Diagnosis not present

## 2014-06-19 DIAGNOSIS — E1140 Type 2 diabetes mellitus with diabetic neuropathy, unspecified: Secondary | ICD-10-CM | POA: Diagnosis present

## 2014-06-19 DIAGNOSIS — T888XXA Other specified complications of surgical and medical care, not elsewhere classified, initial encounter: Secondary | ICD-10-CM

## 2014-06-19 DIAGNOSIS — M722 Plantar fascial fibromatosis: Secondary | ICD-10-CM | POA: Diagnosis present

## 2014-06-19 DIAGNOSIS — E46 Unspecified protein-calorie malnutrition: Secondary | ICD-10-CM | POA: Diagnosis present

## 2014-06-19 DIAGNOSIS — F17210 Nicotine dependence, cigarettes, uncomplicated: Secondary | ICD-10-CM | POA: Diagnosis present

## 2014-06-19 DIAGNOSIS — A419 Sepsis, unspecified organism: Secondary | ICD-10-CM | POA: Insufficient documentation

## 2014-06-19 DIAGNOSIS — G4730 Sleep apnea, unspecified: Secondary | ICD-10-CM | POA: Diagnosis present

## 2014-06-19 DIAGNOSIS — A4101 Sepsis due to Methicillin susceptible Staphylococcus aureus: Principal | ICD-10-CM | POA: Diagnosis present

## 2014-06-19 HISTORY — DX: Depression, unspecified: F32.A

## 2014-06-19 HISTORY — DX: Sleep apnea, unspecified: G47.30

## 2014-06-19 HISTORY — DX: Major depressive disorder, single episode, unspecified: F32.9

## 2014-06-19 HISTORY — DX: Pure hypercholesterolemia, unspecified: E78.00

## 2014-06-19 HISTORY — DX: Type 2 diabetes mellitus without complications: E11.9

## 2014-06-19 LAB — COMPREHENSIVE METABOLIC PANEL
ALT: 23 U/L (ref 0–53)
ANION GAP: 13 (ref 5–15)
AST: 28 U/L (ref 0–37)
Albumin: 1.8 g/dL — ABNORMAL LOW (ref 3.5–5.2)
Alkaline Phosphatase: 178 U/L — ABNORMAL HIGH (ref 39–117)
BILIRUBIN TOTAL: 1.1 mg/dL (ref 0.3–1.2)
BUN: 19 mg/dL (ref 6–23)
CO2: 22 mmol/L (ref 19–32)
Calcium: 8.5 mg/dL (ref 8.4–10.5)
Chloride: 86 mmol/L — ABNORMAL LOW (ref 96–112)
Creatinine, Ser: 1.15 mg/dL (ref 0.50–1.35)
GFR calc Af Amer: 83 mL/min — ABNORMAL LOW (ref >90)
GFR, EST NON AFRICAN AMERICAN: 72 mL/min — AB (ref >90)
GLUCOSE: 420 mg/dL — AB (ref 70–99)
Potassium: 4.8 mmol/L (ref 3.5–5.1)
SODIUM: 121 mmol/L — AB (ref 135–145)
TOTAL PROTEIN: 7.2 g/dL (ref 6.0–8.3)

## 2014-06-19 LAB — LIPID PANEL
CHOL/HDL RATIO: 5.8 ratio
CHOLESTEROL: 104 mg/dL (ref 0–200)
HDL: 18 mg/dL — ABNORMAL LOW (ref >39)
LDL CALC: 62 mg/dL (ref 0–99)
TRIGLYCERIDES: 120 mg/dL (ref <150)
VLDL: 24 mg/dL (ref 0–40)

## 2014-06-19 LAB — GLUCOSE, CAPILLARY: Glucose-Capillary: 401 mg/dL — ABNORMAL HIGH (ref 70–99)

## 2014-06-19 LAB — CBC WITH DIFFERENTIAL/PLATELET
BASOS PCT: 0 % (ref 0–1)
Basophils Absolute: 0 10*3/uL (ref 0.0–0.1)
Eosinophils Absolute: 0 10*3/uL (ref 0.0–0.7)
Eosinophils Relative: 0 % (ref 0–5)
HEMATOCRIT: 41.2 % (ref 39.0–52.0)
Hemoglobin: 14.9 g/dL (ref 13.0–17.0)
Lymphocytes Relative: 6 % — ABNORMAL LOW (ref 12–46)
Lymphs Abs: 2 10*3/uL (ref 0.7–4.0)
MCH: 31.2 pg (ref 26.0–34.0)
MCHC: 36.2 g/dL — ABNORMAL HIGH (ref 30.0–36.0)
MCV: 86.2 fL (ref 78.0–100.0)
Monocytes Absolute: 1.4 10*3/uL — ABNORMAL HIGH (ref 0.1–1.0)
Monocytes Relative: 4 % (ref 3–12)
NEUTROS ABS: 30.4 10*3/uL — AB (ref 1.7–7.7)
Neutrophils Relative %: 90 % — ABNORMAL HIGH (ref 43–77)
Platelets: 328 10*3/uL (ref 150–400)
RBC: 4.78 MIL/uL (ref 4.22–5.81)
RDW: 12.7 % (ref 11.5–15.5)
WBC: 33.8 10*3/uL — ABNORMAL HIGH (ref 4.0–10.5)

## 2014-06-19 LAB — LACTIC ACID, PLASMA
Lactic Acid, Venous: 2.1 mmol/L (ref 0.5–2.0)
Lactic Acid, Venous: 1.6 mmol/L (ref 0.5–2.0)

## 2014-06-19 LAB — CBG MONITORING, ED: Glucose-Capillary: 335 mg/dL — ABNORMAL HIGH (ref 70–99)

## 2014-06-19 MED ORDER — INSULIN ASPART 100 UNIT/ML ~~LOC~~ SOLN
8.0000 [IU] | Freq: Once | SUBCUTANEOUS | Status: AC
  Administered 2014-06-19: 8 [IU] via SUBCUTANEOUS

## 2014-06-19 MED ORDER — GABAPENTIN 300 MG PO CAPS
600.0000 mg | ORAL_CAPSULE | Freq: Three times a day (TID) | ORAL | Status: DC
  Administered 2014-06-19 – 2014-06-23 (×13): 600 mg via ORAL
  Filled 2014-06-19 (×16): qty 2

## 2014-06-19 MED ORDER — ENOXAPARIN SODIUM 40 MG/0.4ML ~~LOC~~ SOLN
40.0000 mg | SUBCUTANEOUS | Status: DC
  Administered 2014-06-19 – 2014-07-03 (×12): 40 mg via SUBCUTANEOUS
  Filled 2014-06-19 (×19): qty 0.4

## 2014-06-19 MED ORDER — PIPERACILLIN-TAZOBACTAM 3.375 G IVPB 30 MIN
3.3750 g | Freq: Once | INTRAVENOUS | Status: AC
  Administered 2014-06-19: 3.375 g via INTRAVENOUS
  Filled 2014-06-19: qty 50

## 2014-06-19 MED ORDER — LISINOPRIL 10 MG PO TABS
10.0000 mg | ORAL_TABLET | Freq: Every day | ORAL | Status: DC
  Administered 2014-06-20 – 2014-06-23 (×4): 10 mg via ORAL
  Filled 2014-06-19 (×5): qty 1

## 2014-06-19 MED ORDER — HYDROCODONE-ACETAMINOPHEN 5-325 MG PO TABS
1.0000 | ORAL_TABLET | ORAL | Status: DC | PRN
  Administered 2014-06-19 – 2014-06-21 (×7): 2 via ORAL
  Administered 2014-06-22: 1 via ORAL
  Administered 2014-06-23: 2 via ORAL
  Administered 2014-06-23: 1 via ORAL
  Administered 2014-06-23: 2 via ORAL
  Filled 2014-06-19: qty 1
  Filled 2014-06-19 (×2): qty 2
  Filled 2014-06-19: qty 1
  Filled 2014-06-19 (×7): qty 2

## 2014-06-19 MED ORDER — ONDANSETRON HCL 4 MG/2ML IJ SOLN: 4.0000 mg | Freq: Four times a day (QID) | INTRAMUSCULAR | Status: DC | PRN

## 2014-06-19 MED ORDER — VANCOMYCIN HCL IN DEXTROSE 750-5 MG/150ML-% IV SOLN
750.0000 mg | Freq: Two times a day (BID) | INTRAVENOUS | Status: DC
  Administered 2014-06-20: 750 mg via INTRAVENOUS
  Filled 2014-06-19 (×3): qty 150

## 2014-06-19 MED ORDER — ONDANSETRON HCL 4 MG PO TABS: 4.0000 mg | ORAL_TABLET | Freq: Four times a day (QID) | ORAL | Status: DC | PRN

## 2014-06-19 MED ORDER — SODIUM CHLORIDE 0.9 % IV BOLUS (SEPSIS)
2000.0000 mL | Freq: Once | INTRAVENOUS | Status: AC
  Administered 2014-06-19: 1000 mL via INTRAVENOUS

## 2014-06-19 MED ORDER — INSULIN ASPART 100 UNIT/ML ~~LOC~~ SOLN
0.0000 [IU] | Freq: Three times a day (TID) | SUBCUTANEOUS | Status: DC
  Administered 2014-06-20: 2 [IU] via SUBCUTANEOUS
  Administered 2014-06-20: 3 [IU] via SUBCUTANEOUS
  Administered 2014-06-22: 2 [IU] via SUBCUTANEOUS
  Administered 2014-06-22: 1 [IU] via SUBCUTANEOUS
  Administered 2014-06-23: 2 [IU] via SUBCUTANEOUS

## 2014-06-19 MED ORDER — PIPERACILLIN-TAZOBACTAM 3.375 G IVPB
3.3750 g | Freq: Three times a day (TID) | INTRAVENOUS | Status: DC
  Administered 2014-06-20 – 2014-06-21 (×5): 3.375 g via INTRAVENOUS
  Filled 2014-06-19 (×7): qty 50

## 2014-06-19 MED ORDER — ARIPIPRAZOLE 5 MG PO TABS
5.0000 mg | ORAL_TABLET | Freq: Every morning | ORAL | Status: DC
  Administered 2014-06-20 – 2014-06-23 (×4): 5 mg via ORAL
  Filled 2014-06-19 (×5): qty 1

## 2014-06-19 MED ORDER — ACETAMINOPHEN 325 MG PO TABS
650.0000 mg | ORAL_TABLET | Freq: Once | ORAL | Status: AC
  Administered 2014-06-19: 650 mg via ORAL
  Filled 2014-06-19: qty 2

## 2014-06-19 MED ORDER — VANCOMYCIN HCL 10 G IV SOLR
2000.0000 mg | Freq: Once | INTRAVENOUS | Status: AC
  Administered 2014-06-19: 2000 mg via INTRAVENOUS
  Filled 2014-06-19: qty 2000

## 2014-06-19 MED ORDER — INSULIN DETEMIR 100 UNIT/ML ~~LOC~~ SOLN
55.0000 [IU] | Freq: Two times a day (BID) | SUBCUTANEOUS | Status: DC
  Administered 2014-06-19 – 2014-06-20 (×3): 55 [IU] via SUBCUTANEOUS
  Filled 2014-06-19 (×5): qty 0.55

## 2014-06-19 MED ORDER — DULOXETINE HCL 60 MG PO CPEP
90.0000 mg | ORAL_CAPSULE | Freq: Every evening | ORAL | Status: DC
  Administered 2014-06-19 – 2014-06-23 (×5): 90 mg via ORAL
  Filled 2014-06-19 (×6): qty 1

## 2014-06-19 MED ORDER — SODIUM CHLORIDE 0.9 % IV BOLUS (SEPSIS)
1000.0000 mL | Freq: Once | INTRAVENOUS | Status: AC
  Administered 2014-06-19: 1000 mL via INTRAVENOUS

## 2014-06-19 MED ORDER — IOHEXOL 300 MG/ML  SOLN
100.0000 mL | Freq: Once | INTRAMUSCULAR | Status: AC | PRN
  Administered 2014-06-19: 100 mL via INTRAVENOUS

## 2014-06-19 MED ORDER — SODIUM CHLORIDE 0.9 % IV SOLN
INTRAVENOUS | Status: AC
  Administered 2014-06-19: 18:00:00 via INTRAVENOUS
  Administered 2014-06-20 – 2014-06-21 (×2): 1000 mL via INTRAVENOUS

## 2014-06-19 MED ORDER — HYDROMORPHONE HCL 1 MG/ML IJ SOLN
1.0000 mg | INTRAMUSCULAR | Status: DC | PRN
  Administered 2014-06-20 – 2014-06-24 (×3): 1 mg via INTRAVENOUS
  Filled 2014-06-19 (×3): qty 1

## 2014-06-19 MED ORDER — ATENOLOL 50 MG PO TABS
50.0000 mg | ORAL_TABLET | Freq: Every day | ORAL | Status: DC
  Administered 2014-06-20 – 2014-06-24 (×5): 50 mg via ORAL
  Filled 2014-06-19 (×5): qty 1

## 2014-06-19 NOTE — ED Notes (Signed)
Ben PA at bedside. 

## 2014-06-19 NOTE — ED Notes (Signed)
Patient reports ingrown hair to back of neck.  Later developed into bumps.  Now area is red, swollen, broken areas to skin, some skin has peeled back from area.  Symptoms onset 2 weeks ago.  Denies fever.  Difficulty moving neck, holds head looking downward during interview.

## 2014-06-19 NOTE — H&P (Addendum)
[facility] History and Physical  Eric Shaw WUJ:[id number] DOB: 1960/09/11 DOA: 06/19/2014  Referring physician: ED physician PCP: No PCP Per Patient   Chief Complaint: neck swelling   HPI:  Patient is 54 year old male with known type 2 diabetes mellitus, last known A1c in August 2014 was 9.8, depression, suicidal ideations in the past and requiring behavioral health hospitalization, morbid obesity, hypertension, presented to [facility] emergency department with main concern of progressively worsening neck pain, swelling, redness, tenderness to touch. Patient reports he noticed swelling about one week prior to this admission and it has gotten progressively worse. Pain is now throbbing and constant, 5/10 in severity, worse with movement especially flexion and extension of the neck, patient explains he is able to rotate neck left and right but also with difficulty. No specific radiating symptoms. Patient explains he believes he initially had infected hair follicle in the back of the neck and he has tried to squeeze out some pus from it but this has gotten worse. He has not tried any medications over the counter and has noticed that wound was getting progressively bigger and was actively draining yellow pus. Patient denies any specific alleviating factors, no similar events in the past. He explains he has not been able to eat much and has not taken any of his blood pressure or diabetes medications over 10 days. Patient went to urgent care and was referred to emergency department for further evaluation. Patient currently denies chest pain or shortness of breath, no fevers or chills, no specific abdominal or urinary concerns. Patient also denies visual changes, headaches, no focal neurological symptoms.  In emergency department, patient hemodynamically stable, vital signs notable for temperature 97.5 F, heart rate 124, blood pressure 138/72. WBC 33K, sodium 121, glucose level > 400. Patient in  mild distress due to pain. CT of the neck with contrast consistent with severe cellulitis extending into trapezius and semispinalis capitis musculature, no focal fluid collection to suggest abscess. Patient started on vancomycin in emergency department and tried hospitalist asked to admit for further evaluation.  Assessment and Plan: Active Problems: SIRS with early sepsis secondary to severe neck cellulitis - Admit patient to medical unit - Since patient has known diabetes, uncontrolled with last A1c >  9.8, will extend antibiotic coverage to Zosyn in addition to vancomycin - Cellulitis order set in place - Provide IV fluids, analgesia as needed - Obtained blood cultures, HIV, lactic acid - Importance of diabetes control discussed with patient - Repeat CBC in the morning Diabetes mellitus type 2, uncontrolled with complications of neuropathy - Last A1c checked in August 2014 9.8 - We'll repeat A1c - Placed on home regimen with Levemir, also add sliding scale insulin - Diabetic educator consulted - Given patient's underlying depression and history of suicidal ideations, he will need continued education on diabetes control - Check lipid panel as well to ensure LDL is at target range - Continue Neurontin for neuropathic pain control Hypertension - Blood pressure stable on admission - Continue home medical regimen with atenolol 50 mg tablet daily, lisinopril 10 mg tablet daily Hypernatremia - In the setting of dehydration and hyperglycemia - Provide IV fluids, monitor CBGs - repeat BMP in the morning Morbid obesity - Body mass index is 33.72  Depression - Appears stable on exam today, denies suicidal ideation   Radiological Exams on Admission: Ct Soft Tissue Neck W Contrast 06/19/2014   CT findings are most consistent with severe cellulitis involving the posterior soft tissues of the occiput and  upper cervical spine. Edema extends into the trapezius and semispinalis capitis  musculature. However, there is no focal fluid collection to suggest abscess. Mild atherosclerotic calcifications in the bilateral proximal internal carotid arteries. Focal degenerative disc disease with mild central stenosis at C7-T1.    Code Status: Full Family Communication: Pt at bedside Disposition Plan: Admit for further evaluation , consult case management, try to set up patient with primary care physician for appropriate follow-up  [name] [phone]  Review of Systems:  Constitutional: Negative for fever, chills. Negative for diaphoresis.  HENT: Negative for hearing loss, ear pain, nosebleeds, tinnitus and ear discharge.   Eyes: Negative for blurred vision, double vision, photophobia, pain, discharge and redness.  Respiratory: Negative for cough, hemoptysis, sputum production, shortness of breath, wheezing and stridor.   Cardiovascular: Negative for chest pain, palpitations, orthopnea, claudication and leg swelling.  Gastrointestinal: Negative for heartburn, constipation, blood in stool and melena.  Genitourinary: Negative for  hematuria and flank pain.  Musculoskeletal: Negative for myalgias, joint pain and falls.  Skin: Negative for itching and rash.  Neurological: Negative for dizziness and weakness.  Endo/Heme/Allergies: Negative for environmental allergies and polydipsia. Does not bruise/bleed easily.  Psychiatric/Behavioral: Negative for suicidal ideas. The patient is not nervous/anxious.     Past Medical History  Diagnosis Date  . Diabetes mellitus without complication   . Plantar fasciitis, bilateral   . Arthritis   . Hypertension     Past Surgical History  Procedure Laterality Date  . Reduction of torsion of testis      Social History:  reports that he has been smoking Cigarettes.  He has a 17.5 pack-year smoking history. He does not have any smokeless tobacco history on file. He reports that he drinks alcohol. He reports that he does not use illicit  drugs.  No Known Allergies  Family history of hypertension, diabetes but patient not sure if on mother or father's side  Prior to Admission medications   Medication Sig Start Date End Date Taking? Authorizing Provider  ARIPiprazole (ABILIFY) 5 MG tablet Take 5 mg by mouth every morning.   Yes Historical Provider, MD  atenolol (TENORMIN) 50 MG tablet Take 50 mg by mouth daily.   Yes Historical Provider, MD  DULoxetine (CYMBALTA) 30 MG capsule Take 90 mg by mouth every evening.   Yes Historical Provider, MD  gabapentin (NEURONTIN) 300 MG capsule Take 600 mg by mouth 3 (three) times daily.   Yes Historical Provider, MD  insulin detemir (LEVEMIR) 100 UNIT/ML injection Inject 55 Units into the skin 2 (two) times daily.   Yes Historical Provider, MD  insulin lispro (HUMALOG) 100 UNIT/ML injection Inject 0-18 Units into the skin 3 (three) times daily after meals. 15 minutes after meals   Yes Historical Provider, MD  lisinopril (PRINIVIL,ZESTRIL) 10 MG tablet Take 1 tablet (10 mg total) by mouth daily. For hypertension and renal protection. 12/31/12  Yes Verne SpurrNeil Mashburn, PA-C  naproxen (NAPROSYN) 500 MG tablet Take 500 mg by mouth 2 (two) times daily as needed for moderate pain.   Yes Historical Provider, MD    Physical Exam: Filed Vitals:   06/19/14 1515 06/19/14 1600 06/19/14 1615 06/19/14 1730  BP: 141/86 146/83 130/60 138/72  Pulse: 103 99 98 94  Temp:      TempSrc:      Resp: 15  21 21   Height:      Weight:      SpO2: 97% 99% 99% 98%    Physical Exam  Constitutional:  Appears well-developed and well-nourished. No distress.  HENT: Normocephalic. External right and left ear normal. Dry mucous membranes Eyes: Conjunctivae and EOM are normal. PERRLA, no scleral icterus.  Neck: Normal ROM. Neck supple. No JVD. No tracheal deviation. No thyromegaly.  CVS: RRR, S1/S2 +, no murmurs, no gallops, no carotid bruit.  Pulmonary: Effort and breath sounds normal, no stridor, diminished breath sounds  at bases Abdominal: Soft. BS +,  no distension, tenderness, rebound or guarding.  Musculoskeletal: Normal range of motion.  Lymphadenopathy: No lymphadenopathy noted, cervical, inguinal. Neuro: Alert. Normal reflexes, muscle tone coordination. No cranial nerve deficit. Skin: Significant swelling in the posterior neck area, erythema, significant tenderness to palpation, several open spots draining pus, yellow with no blood  Psychiatric: Normal mood and affect. Behavior, judgment, thought content normal.    Labs on Admission:  Basic Metabolic Panel:  Recent Labs Lab 06/19/14 1311  NA 121*  K 4.8  CL 86*  CO2 22  GLUCOSE 420*  BUN 19  CREATININE 1.15  CALCIUM 8.5   Liver Function Tests:  Recent Labs Lab 06/19/14 1311  AST 28  ALT 23  ALKPHOS 178*  BILITOT 1.1  PROT 7.2  ALBUMIN 1.8*   CBC:  Recent Labs Lab 06/19/14 1311  WBC 33.8*  NEUTROABS 30.4*  HGB 14.9  HCT 41.2  MCV 86.2  PLT 328   CBG:  Recent Labs Lab 06/19/14 1738  GLUCAP 335*    EKG: Normal sinus rhythm, no ST/T wave changes  If 7PM-7AM, please contact night-coverage [email or web address] Password [facility] 06/19/2014, 5:40 PM

## 2014-06-19 NOTE — Progress Notes (Signed)
Pt admitted to the unit at 1812. Pt mental status is A&OX4. Pt oriented to room, staff, and call bell. Skin is intact other than cellulitis to back of neck. Full assessment charted in [facility]. Call bell within reach. Visitor guidelines reviewed w/ pt and/or family.

## 2014-06-19 NOTE — ED Notes (Signed)
Dr. Nanavati at bedside 

## 2014-06-19 NOTE — ED Notes (Signed)
Attempted report 

## 2014-06-19 NOTE — ED Notes (Signed)
Pt reports skin infection to back of neck for 1 week. Seen at [facility] today and sent here for furthur eval.

## 2014-06-19 NOTE — ED Notes (Signed)
Admits to not taking medicines for 9-10 days

## 2014-06-19 NOTE — Progress Notes (Addendum)
ANTIBIOTIC CONSULT NOTE - INITIAL  Pharmacy Consult for vancomycin Indication: neck wound infxn  No Known Allergies  Patient Measurements: Height: 5\' 10"  (177.8 cm) Weight: 235 lb (106.595 kg) IBW/kg (Calculated) : 73 Adjusted Body Weight:   Vital Signs: Temp: 97.5 F (36.4 C) (02/15 1214) Temp Source: Oral (02/15 1214) BP: 130/60 mmHg (02/15 1615) Pulse Rate: 98 (02/15 1615) Intake/Output from previous day:   Intake/Output from this shift: Total I/O In: 1000 [I.V.:1000] Out: -   Labs:  Recent Labs  06/19/14 1311  WBC 33.8*  HGB 14.9  PLT 328  CREATININE 1.15   Estimated Creatinine Clearance: 91.8 mL/min (by C-G formula based on Cr of 1.15). No results for input(s): VANCOTROUGH, VANCOPEAK, VANCORANDOM, GENTTROUGH, GENTPEAK, GENTRANDOM, TOBRATROUGH, TOBRAPEAK, TOBRARND, AMIKACINPEAK, AMIKACINTROU, AMIKACIN in the last 72 hours.   Microbiology: No results found for this or any previous visit (from the past 720 hour(s)).  Medical History: Past Medical History  Diagnosis Date  . Diabetes mellitus without complication   . Plantar fasciitis, bilateral   . Arthritis   . Hypertension     Medications:  Anti-infectives    Start     Dose/Rate Route Frequency Ordered Stop   06/20/14 0800  vancomycin (VANCOCIN) IVPB 750 mg/150 ml premix     750 mg 150 mL/hr over 60 Minutes Intravenous Every 12 hours 06/19/14 1636     06/19/14 1645  vancomycin (VANCOCIN) 2,000 mg in sodium chloride 0.9 % 500 mL IVPB     2,000 mg 250 mL/hr over 120 Minutes Intravenous  Once 06/19/14 1634       Assessment: 53 yom presented to the ED with a recurrent wound infection on the back of his neck. To start empiric vancomycin for treatment. Pt is currently afebrile and WBC is significantly elevated at 33.8. Scr is 1.15.  Vanc 2/15>>  Goal of Therapy:  Vancomycin trough level 10-15 mcg/ml  Plan:  1. Vancomycin 2gm IV x 1 then 750mg  IV Q12H 2. F/u renal fxn, C&S, clinical status [facility]  Saylee Sherrill, Drake LeachRachel Lynn 06/19/2014,4:36 PM  Addendum: Adding zosyn for cellulitis coverage. First dose ordered by MD.   Plan: 1. Zosyn 3.375gm IV Q8H (4 hr inf) 2. F/u renal fxn, C&S, clinical status  Lysle PearlRachel Sade Hollon, PharmD, BCPS Pager # [phone] 06/19/2014 6:21 PM

## 2014-06-19 NOTE — ED Provider Notes (Signed)
   Chief Complaint    Wound Infection   History of Present Illness      Pierce CraneJon Petteway is a 54 year old diabetic male who has had a one-week history of pain and swelling of his posterior neck. The patient thinks this started out as an ingrown hair and now has spread to encompass the entire posterior neck. He has multiple areas draining pus. He denies any fever or chills. He's never had anything like this before. No other skin lesions.  Review of Systems   Other than as noted above, the patient denies any of the following symptoms: Systemic:  No fever or chills. ENT:  No nasal congestion, rhinorrhea, sore throat, swelling of lips, tongue or throat. Resp:  No cough, wheezing, or shortness of breath.  PMFSH    Past medical history, family history, social history, meds, and allergies were reviewed. He has diabetes and hypertension and is on Abilify, atenolol, Cymbalta, Neurontin, Levemir, Humalog, and lisinopril.  Physical Exam     Vital signs:  BP 143/81 mmHg  Pulse 124  Temp(Src) 97.2 F (36.2 C) (Oral)  Resp 20  Ht 5\' 10"  (1.778 m)  Wt 235 lb (106.595 kg)  BMI 33.72 kg/m2  SpO2 96% Gen:  Alert, oriented, in no distress. ENT:  Pharynx clear, no intraoral lesions, moist mucous membranes. Lungs:  Clear to auscultation. Skin:  Extensive area of cellulitis on posterior neck with erythema, swelling, induration, and tenderness to palpation extending from the occipital area of the scalp down to the upper back and encompassing the entire posterior neck. There are multiple areas draining pus in the middle of this. No crepitus or bulla formation.    Assessment    The encounter diagnosis was Cellulitis of neck.  Severe cellulitis involving posterior neck, will require IV antibiotics and most likely admission. No evidence of necrotizing fasciitis, but this may be dissecting cellulitis of scalp.  Plan     The patient was transferred to the ED via shuttle in stable  condition.  Medical Decision Making:  54 year old diabetic patient has had a one-week history of what started out as an ingrown hair in his posterior neck and has progressed to involve his entire posterior neck with swelling, pain, and drainage. On examination he has extensive area of saline is on his posterior neck extending up into the scalp and on the upper back. He's unable to fully extend his neck and is sitting with his head bent forward. Is tender to touch and there multiple areas of draining some pus. There is no crepitus or bulla formation. The whole area is very indurated. He has a large area of cellulitis and with him being a diabetic, will certainly need IV antibiotics and most likely admission.      Reuben Likesavid C Theophile Harvie, MD 06/19/14 [phone]

## 2014-06-19 NOTE — ED Provider Notes (Signed)
CSN: [id number]     Arrival date & time 06/19/14  1208 History   First MD Initiated Contact with Patient 06/19/14 1223     Chief Complaint  Patient presents with  . Recurrent Skin Infections     (Consider location/radiation/quality/duration/timing/severity/associated sxs/prior Treatment) HPI Raymon Schlarb is a 54 y.o. male with a history of type 2 diabetes comes in for evaluation of neck infection. Patient sent from urgent care facility. Patient states since last Sunday he has had increasing posterior neck discomfort that he attributes to a growing wound. He thinks that originally he had an infected hair follicle on the back of his neck that has gotten progressively worse. He has not tried anything for his symptoms. He reports the wound is actively draining pus. Palpation of the wound and lifting his head exacerbates his discomfort. Nothing makes it better. He rates his discomfort as a 5/10 at this time. He denies any fevers, headache, vision changes, sore throat, chest pain, shortness of breath, cough, nausea or vomiting, constipation or diarrhea, abdominal pain, numbness or weakness. Current every day smoker. Also, patient reports he has not taken any of his hypertension or diabetes medications for 10 days. He thought that he was doing fine without them so he decided to quit using them because "I felt invincible".   Past Medical History  Diagnosis Date  . Diabetes mellitus without complication   . Plantar fasciitis, bilateral   . Arthritis   . Hypertension    Past Surgical History  Procedure Laterality Date  . Reduction of torsion of testis     History reviewed. No pertinent family history. History  Substance Use Topics  . Smoking status: Current Every Day Smoker -- 0.50 packs/day for 35 years    Types: Cigarettes  . Smokeless tobacco: Not on file  . Alcohol Use: Yes     Comment: Occasionally; has not had any etoh in 8 months    Review of Systems  A 10 point review of systems  was completed and was negative except for pertinent positives and negatives as mentioned in the history of present illness    Allergies  Review of patient's allergies indicates no known allergies.  Home Medications   Prior to Admission medications   Medication Sig Start Date End Date Taking? Authorizing Provider  ARIPiprazole (ABILIFY) 5 MG tablet Take 5 mg by mouth every morning.   Yes Historical Provider, MD  atenolol (TENORMIN) 50 MG tablet Take 50 mg by mouth daily.   Yes Historical Provider, MD  DULoxetine (CYMBALTA) 30 MG capsule Take 90 mg by mouth every evening.   Yes Historical Provider, MD  gabapentin (NEURONTIN) 300 MG capsule Take 600 mg by mouth 3 (three) times daily.   Yes Historical Provider, MD  insulin detemir (LEVEMIR) 100 UNIT/ML injection Inject 55 Units into the skin 2 (two) times daily.   Yes Historical Provider, MD  insulin lispro (HUMALOG) 100 UNIT/ML injection Inject 0-18 Units into the skin 3 (three) times daily after meals. 15 minutes after meals   Yes Historical Provider, MD  lisinopril (PRINIVIL,ZESTRIL) 10 MG tablet Take 1 tablet (10 mg total) by mouth daily. For hypertension and renal protection. 12/31/12  Yes Verne Spurr, PA-C  naproxen (NAPROSYN) 500 MG tablet Take 500 mg by mouth 2 (two) times daily as needed for moderate pain.   Yes Historical Provider, MD   BP 130/60 mmHg  Pulse 98  Temp(Src) 97.5 F (36.4 C) (Oral)  Resp 21  Ht  (1.778 m)  Wt 235 lb (106.595 kg)  BMI 33.72 kg/m2  SpO2 99% Physical Exam  Constitutional: He is oriented to person, place, and time. He appears well-developed and well-nourished.  HENT:  Head: Normocephalic and atraumatic.  Mouth/Throat: Oropharynx is clear and moist.  Eyes: Conjunctivae are normal. Pupils are equal, round, and reactive to light. Right eye exhibits no discharge. Left eye exhibits no discharge. No scleral icterus.  Neck: Neck supple. No JVD present. No tracheal deviation present. No thyromegaly  present.  See picture for reference. There are open draining pustules. There is surrounding erythema and induration across the entirety of posterior neck. Moderate scaling of skin also noted around the wound. Patient maintains neck flexion due to discomfort of extension.  Cardiovascular: Normal rate, regular rhythm and normal heart sounds.   Pulmonary/Chest: Effort normal and breath sounds normal. No respiratory distress. He has no wheezes. He has no rales.  Abdominal: Soft. There is no tenderness.  Musculoskeletal: He exhibits no tenderness.  Lymphadenopathy:    He has no cervical adenopathy.  Neurological: He is alert and oriented to person, place, and time.  Cranial Nerves II-XII grossly intact  Skin: Skin is warm and dry. No rash noted.  Psychiatric: He has a normal mood and affect.  Nursing note and vitals reviewed.     ED Course  Procedures (including critical care time) Labs Review Labs Reviewed  COMPREHENSIVE METABOLIC PANEL - Abnormal; Notable for the following:    Sodium 121 (*)    Chloride 86 (*)    Glucose, Bld 420 (*)    Albumin 1.8 (*)    Alkaline Phosphatase 178 (*)    GFR calc non Af Amer 72 (*)    GFR calc Af Amer 83 (*)    All other components within normal limits  CBC WITH DIFFERENTIAL/PLATELET - Abnormal; Notable for the following:    WBC 33.8 (*)    MCHC 36.2 (*)    Neutrophils Relative % 90 (*)    Lymphocytes Relative 6 (*)    Neutro Abs 30.4 (*)    Monocytes Absolute 1.4 (*)    All other components within normal limits  WOUND CULTURE    Imaging Review Ct Soft Tissue Neck W Contrast  06/19/2014   CLINICAL DATA:  [age] year old male with draining soft tissue infection which reportedly began as an ingrown hair.  EXAM: CT NECK WITH CONTRAST  TECHNIQUE: Multidetector CT imaging of the neck was performed using the standard protocol following the bolus administration of intravenous contrast.  CONTRAST:  100mL OMNIPAQUE IOHEXOL 300 MG/ML  SOLN  COMPARISON:   Prior CT scan of the face and orbits 03/09/2013  FINDINGS: Pharynx and larynx: Within normal limits. No asymmetric soft tissue fullness or enhancement.  Salivary glands: Within normal limits.  Thyroid: Normal.  Lymph nodes: No pathologic adenopathy identified.  Vascular: Mild bilateral atherosclerotic calcifications in the proximal internal carotid arteries.  Limited intracranial: Within normal limits  Visualized orbits: Intact and symmetric bilaterally.  Mastoids and visualized paranasal sinuses: Normal aeration.  Skeleton: No acute fracture or aggressive appearing lytic or blastic osseous lesion.  Upper chest: Unremarkable.  Other: Extensive skin thickening and reticulation of the subcutaneous fat beginning in the occipital skull and extending inferiorly to the upper back. Edema tracks into the trapezius and semi spinalis capitis musculature. No focal fluid collection to suggest abscess. Degenerative disc disease with posterior disc osteophyte complex at C7-T1. Mild central stenosis.  IMPRESSION: 1. CT findings are most consistent with severe cellulitis involving the posterior soft  tissues of the occiput and upper cervical spine. Edema extends into the trapezius and semispinalis capitis musculature. However, there is no focal fluid collection to suggest abscess. 2. Mild atherosclerotic calcifications in the bilateral proximal internal carotid arteries. 3. Focal degenerative disc disease with mild central stenosis at C7-T1.   Electronically Signed   By: Malachy Moan M.D.   On: 06/19/2014 16:29     EKG Interpretation None     Meds given in ED:  Medications  vancomycin (VANCOCIN) 2,000 mg in sodium chloride 0.9 % 500 mL IVPB (not administered)  vancomycin (VANCOCIN) IVPB 750 mg/150 ml premix (not administered)  acetaminophen (TYLENOL) tablet 650 mg (650 mg Oral Given 06/19/14 1334)  sodium chloride 0.9 % bolus 1,000 mL (0 mLs Intravenous Stopped 06/19/14 1622)  sodium chloride 0.9 % bolus 2,000 mL  (1,000 mLs Intravenous New Bag/Given 06/19/14 1622)  iohexol (OMNIPAQUE) 300 MG/ML solution 100 mL (100 mLs Intravenous Contrast Given 06/19/14 1543)    New Prescriptions   No medications on file   Filed Vitals:   06/19/14 1500 06/19/14 1515 06/19/14 1600 06/19/14 1615  BP: 140/82 141/86 146/83 130/60  Pulse: 105 103 99 98  Temp:      TempSrc:      Resp: Height:      Weight:      SpO2: 97% 97% 99% 99%    MDM  Vitals stable - WNL -afebrile Pt resting comfortably in ED. PE as mentioned above Labwork--significant for hyponatremia at 121, hypo-chloridemia at 86, hyperglycemia at 420, leukocytosis 33.8 Imaging-due to patient's immunocompromised status and location of infection will obtain CT neck soft tissue for further appreciation of infection and rule out of necrotizing fasciitis. CT shows severe cellulitis involving the posterior soft tissues of occiput and upper cervical spine. No focal fluid collection to suggest abscess. Wound cultures obtained. Started on vancomycin in the ED Hyperglycemia without any evidence of DKA.  Discussed patient presentation and ED course with Dr. Rhunette Croft, decision made to admit patient to medicine for IV antibiotics, correction of hypoNa, hyperglycemia. Pt admitted.  Final diagnoses:  Hyponatremia  Abscess and cellulitis  Hyperglycemia without ketosis        Sharlene Motts, PA-C 06/19/14 1659  Derwood Kaplan, MD 06/20/14 [phone]

## 2014-06-19 NOTE — Discharge Instructions (Signed)
We have determined that your problem requires further evaluation in the emergency department.  We will take care of your transport there.  Once at the emergency department, you will be evaluated by a provider and they will order whatever treatment or tests they deem necessary.  We cannot guarantee that they will do any specific test or do any specific treatment.  ° °

## 2014-06-19 NOTE — ED Notes (Addendum)
CBG 335. Dr Izola PriceMyers at bedside.

## 2014-06-20 ENCOUNTER — Inpatient Hospital Stay (HOSPITAL_COMMUNITY): Payer: Self-pay

## 2014-06-20 DIAGNOSIS — L03221 Cellulitis of neck: Secondary | ICD-10-CM | POA: Diagnosis not present

## 2014-06-20 DIAGNOSIS — E871 Hypo-osmolality and hyponatremia: Secondary | ICD-10-CM | POA: Diagnosis present

## 2014-06-20 DIAGNOSIS — A412 Sepsis due to unspecified staphylococcus: Secondary | ICD-10-CM | POA: Diagnosis present

## 2014-06-20 LAB — GLUCOSE, CAPILLARY
Glucose-Capillary: 185 mg/dL — ABNORMAL HIGH (ref 70–99)
Glucose-Capillary: 226 mg/dL — ABNORMAL HIGH (ref 70–99)
Glucose-Capillary: 117 mg/dL — ABNORMAL HIGH (ref 70–99)
Glucose-Capillary: 160 mg/dL — ABNORMAL HIGH (ref 70–99)

## 2014-06-20 LAB — BASIC METABOLIC PANEL
Anion gap: 15 (ref 5–15)
BUN: 17 mg/dL (ref 6–23)
CO2: 17 mmol/L — ABNORMAL LOW (ref 19–32)
Calcium: 8.2 mg/dL — ABNORMAL LOW (ref 8.4–10.5)
Chloride: 96 mmol/L (ref 96–112)
Creatinine, Ser: 0.8 mg/dL (ref 0.50–1.35)
GFR calc non Af Amer: >90 mL/min (ref >90)
GLUCOSE: 211 mg/dL — AB (ref 70–99)
POTASSIUM: 4.2 mmol/L (ref 3.5–5.1)
Sodium: 127 mmol/L — ABNORMAL LOW (ref 135–145)

## 2014-06-20 LAB — CBC
HCT: 41.2 % (ref 39.0–52.0)
Hemoglobin: 14.6 g/dL (ref 13.0–17.0)
MCH: 30.8 pg (ref 26.0–34.0)
MCHC: 35.4 g/dL (ref 30.0–36.0)
MCV: 86.9 fL (ref 78.0–100.0)
Platelets: 302 10*3/uL (ref 150–400)
RBC: 4.74 MIL/uL (ref 4.22–5.81)
RDW: 13 % (ref 11.5–15.5)
WBC: 29.6 10*3/uL — AB (ref 4.0–10.5)

## 2014-06-20 LAB — TSH: TSH: 1.456 u[IU]/mL (ref 0.350–4.500)

## 2014-06-20 MED ORDER — VANCOMYCIN HCL IN DEXTROSE 1-5 GM/200ML-% IV SOLN
1000.0000 mg | Freq: Two times a day (BID) | INTRAVENOUS | Status: DC
  Administered 2014-06-20 – 2014-06-21 (×2): 1000 mg via INTRAVENOUS
  Filled 2014-06-20 (×4): qty 200

## 2014-06-20 MED ORDER — CIPROFLOXACIN IN D5W 400 MG/200ML IV SOLN
400.0000 mg | Freq: Two times a day (BID) | INTRAVENOUS | Status: DC
  Administered 2014-06-20 – 2014-06-21 (×3): 400 mg via INTRAVENOUS
  Filled 2014-06-20 (×4): qty 200

## 2014-06-20 NOTE — Progress Notes (Addendum)
Inpatient Diabetes Program Recommendations  AACE/ADA: New Consensus Statement on Inpatient Glycemic Control (2013)  Target Ranges:  Prepandial:   less than 140 mg/dL      Peak postprandial:   less than 180 mg/dL (1-2 hours)      Critically ill patients:  140 - 180 mg/dL     Results for [name], Eric Shaw (MRN [id number]) as of 06/20/2014 09:09  Ref. Range 06/19/2014 17:38 06/19/2014 21:05  Glucose-Capillary Latest Range: 70-99 mg/dL 409335 (H) 811401 (H)    Results for [name], [name] (MRN [id number]) as of 06/20/2014 09:09  Ref. Range 06/20/2014 08:09  Glucose-Capillary Latest Range: 70-99 mg/dL 213185 (H)    Admitted with Neck Cellulitis/ Sepsis.  History of DM, HTN, Psych Issues.   Home DM Meds: Levemir 55 units bid       Humalog 0-18 units tid per SSI   Current Orders: Levemir 55 units bid      Novolog Sensitive SSI   **Note A1c pending.  Per MD notes, patient has not taken BP or DM medications in over 10 days b/c he felt poorly.  Expect A1c to be elevated.  **Note also patient does not have PCP.  **Levemir 55 units bid started last PM.  AM CBG today 185 mg/dl.  **DM Coordinator to follow while inpatient.   MD- Please change current diet orders to Carbohydrate Modified diet.  Current diet is Regular diet with no Carbohydrate restrictions.    Ambrose FinlandJeannine Johnston Tuleen Mandelbaum RN, MSN, CDE Diabetes Coordinator Inpatient Diabetes Program Team Pager: [phone] (8a-10p)

## 2014-06-20 NOTE — Progress Notes (Signed)
UR Completed.  [phone]  

## 2014-06-20 NOTE — Progress Notes (Signed)
ANTIBIOTIC CONSULT NOTE Pharmacy Consult for vancomycin/zosyn Indication: neck wound infxn  No Known Allergies  Patient Measurements: Height: 5\' 10"  (177.8 cm) Weight: 235 lb (106.595 kg) IBW/kg (Calculated) : 73   Vital Signs: Temp: 97.3 F (36.3 C) (02/16 0551) Temp Source: Oral (02/16 0551) BP: 110/78 mmHg (02/16 0551) Pulse Rate: 135 (02/16 0551) Intake/Output from previous day: 02/15 0701 - 02/16 0700 In: 2616.7 [I.V.:2066.7; IV Piggyback:550] Out: 1675 [Urine:1675] Intake/Output from this shift: Total I/O In: 460 [P.O.:460] Out: -   Labs:  Recent Labs  06/19/14 1311 06/20/14 0540  WBC 33.8* 29.6*  HGB 14.9 14.6  PLT 328 302  CREATININE 1.15 0.80   Estimated Creatinine Clearance: 132 mL/min (by C-G formula based on Cr of 0.8). No results for input(s): VANCOTROUGH, VANCOPEAK, VANCORANDOM, GENTTROUGH, GENTPEAK, GENTRANDOM, TOBRATROUGH, TOBRAPEAK, TOBRARND, AMIKACINPEAK, AMIKACINTROU, AMIKACIN in the last 72 hours.   Microbiology: Recent Results (from the past 720 hour(s))  Wound culture     Status: None (Preliminary result)   Collection Time: 06/19/14  5:05 PM  Result Value Ref Range Status   Specimen Description WOUND NECK  Final   Special Requests Immunocompromised  Final   Gram Stain   Final    RARE WBC PRESENT, PREDOMINANTLY PMN NO SQUAMOUS EPITHELIAL CELLS SEEN MODERATE GRAM POSITIVE COCCI IN PAIRS Performed at [facility]    Culture PENDING  Incomplete   Report Status PENDING  Incomplete    Assessment: Vanc/zosyn D#2, cipro #1  for back of neck wound infxn/cellulitis - Afebrile, WBC 33.8>29.6, SCr 1.15>0.8, LA 2.1>1.6 CT of the neck with contrast consistent with severe cellulitis extending into trapezius and semispinalis capitis musculature, no focal fluid collection to suggest abscess.   Vanc 2/15>> Zosyn 2/15>> cipro 2/16>>  2/15 neck wound>> mod gpcocci 2/15 bc x 2>>   Goal of Therapy:  Vancomycin trough level 10-15  mcg/ml  Plan:  -increase vancomycin maintenance dose to 1 gm q12h - continue Zosyn 3.375gm IV Q8H (4 hr inf) -MD wants to continue cipro, on 400 IV q12h - F/u renal fxn, C&S, clinical status  Herby AbrahamMichelle T. Leonidus Rowand, Pharm.D. [phone] 06/20/2014 11:30 AM

## 2014-06-20 NOTE — Progress Notes (Addendum)
Inpatient Diabetes Program Recommendations  AACE/ADA: New Consensus Statement on Inpatient Glycemic Control (2013)  Target Ranges:  Prepandial:   less than 140 mg/dL      Peak postprandial:   less than 180 mg/dL (1-2 hours)      Critically ill patients:  140 - 180 mg/dL     Late Entry from 16:10RU11:30am:  Spoke with patient about his DM care at home.  Patient told me he stopped taking Levemir and Humalog over 12 days ago b/c he can't afford these insulins anymore.  Per patient, once he got established in an apartment he lost the free health [facility]  Per patient, when he was homeless, the [facility] provided free insulin and CBG meter supplies to the patient.  Now that he has a place to live, patient can no longer get help form the [facility].  Have asked Eric Shaw (care manager on 5W) to please see patient to help patient possibly get established with the [facility].    MD- We may need to switch patient to 70/30 insulin at time of d/c.  Patient can purchase Reli-on brand 70/30 insulin from [employer] for $25 per vial.  Levemir and Humalog out of pocket will be very expensive for patient and I'm not sure he will be able to afford those two insulins.  Could start with 70/30 insulin- 70 units bid with meals (this dose would be close to the amount of basal insulin patient gets as Levemir 55 units bid)  Patient may also be able to get assistance through the [employer] Program.  [employer] offers the [employer] program that may allow patient to get Humalog 75/25 pre-mixed insulin for free  Please see [email or web address] for more information- Patient will need to complete form found on [employer] website and MD will need to give pt Rx for Humalog 75/25 insulin at time of d/c.    Will follow Eric FinlandJeannine Johnston Tawnia Schirm RN, MSN, CDE Diabetes Coordinator Inpatient Diabetes Program Team Pager:  [phone] (8a-10p)

## 2014-06-20 NOTE — Progress Notes (Signed)
[facility] PROGRESS NOTE  Eric CraneJon Shaw WUJ:[id number] DOB: 02/07/1961 DOA: 06/19/2014 PCP: No PCP Per Patient  Summary I have seen and examined Eric Shaw at bedside and reviewed his chart. Eric Shaw is a pleasant 54 year old male with known type 2 diabetes mellitus, last known A1c in August 2014 was 9.8, depression, suicidal ideations in the past and requiring behavioral health hospitalization, morbid obesity, hypertension, who [facility] department with main concern of progressively worsening neck pain, swelling, redness, tenderness to touch for the last 2-1/2 weeks and he was found to have severe cellulitis with sepsis, white count greater than 33,000. CT of there neck showed "1. CT findings are most consistent with severe cellulitis involving the posterior soft tissues of the occiput and upper cervical spine. Edema extends into the trapezius and semispinalis capitis musculature. However, there is no focal fluid collection to suggest abscess. 2. Mild atherosclerotic calcifications in the bilateral proximal internal carotid arteries. 3. Focal degenerative disc disease with mild central stenosis at C7-T1". Blood culture/wound culture grew staph aureus. Sensitivity pending. Given the degree of cellulitis, will keep on triple antibiotics pending final culture results. White count has improved slightly to 29,000. He reports feeling better. Appreciate diabetes coordinator. Plan Cellulitis/Staphylococcal sepsis  Follow septic workup including blood culture/wound culture. Chest x-ray.  Vancomycin/Zosyn/ciprofloxacin and de-escalate antibiotics in the next day or 2 depending on final culture results and clinical progress. May need ID consultation as to the choice and duration of antibiotics. Hyponatremia  Probably pseudohyponatremia related to hyperglycemia. Improving. Expect continued improvement with treatment of infection and hydration. DM2 (diabetes mellitus, type  2)/HTN  Appreciate diabetic coordinator  BP low normal  Levemir/SSI for now, adjust as necessary. Would follow diabetic coordinator recommendations at DC.  Follow HbA1C.  Continue atenolol Diabetic neuropathy/MDD (major depressive disorder)  No acute changes Code Status: Full code Family Communication: No family at bedside Disposition Plan: Eventually home   Consultants:  None  Procedures:  None  Antibiotics:  Vancomycin 06/19/2014>  Zosyn 06/19/2014>  Ciprofloxacin 06/20/2014>  HPI/Subjective: Feels a little bit better. His appetite still poor.  Objective: Filed Vitals:   06/20/14 1329  BP: 103/89  Pulse: 79  Temp: 97.5 F (36.4 C)  Resp: 18    Intake/Output Summary (Last 24 hours) at 06/20/14 1536 Last data filed at 06/20/14 1300  Gross per 24 hour  Intake 3136.67 ml  Output   1675 ml  Net 1461.67 ml   Filed Weights   06/19/14 1214 06/19/14 1827  Weight: 106.595 kg (235 lb) 106.595 kg (235 lb)    Exam:   General:  Comfortable at rest. Cellulitis of the neck with mild drainage.  Cardiovascular: S1-S2 normal. No murmurs. Pulse regular.  Respiratory: Good air entry bilaterally. No rhonchi or rales.  Abdomen: Soft and nontender. Normal bowel sounds. No organomegaly.  Musculoskeletal: No pedal edema   Neurological: Intact  Data Reviewed: Basic Metabolic Panel:  Recent Labs Lab 06/19/14 1311 06/20/14 0540  NA 121* 127*  K 4.8 4.2  CL 86* 96  CO2 22 17*  GLUCOSE 420* 211*  BUN 19 17  CREATININE 1.15 0.80  CALCIUM 8.5 8.2*   Liver Function Tests:  Recent Labs Lab 06/19/14 1311  AST 28  ALT 23  ALKPHOS 178*  BILITOT 1.1  PROT 7.2  ALBUMIN 1.8*   No results for input(s): LIPASE, AMYLASE in the last 168 hours. No results for input(s): AMMONIA in the last 168 hours. CBC:  Recent Labs Lab 06/19/14 1311 06/20/14 0540  WBC 33.8* 29.6*  NEUTROABS 30.4*  --   HGB 14.9 14.6  HCT 41.2 41.2  MCV 86.2 86.9  PLT 328 302    Cardiac Enzymes: No results for input(s): CKTOTAL, CKMB, CKMBINDEX, TROPONINI in the last 168 hours. BNP (last 3 results) No results for input(s): BNP in the last 8760 hours.  ProBNP (last 3 results) No results for input(s): PROBNP in the last 8760 hours.  CBG:  Recent Labs Lab 06/19/14 1738 06/19/14 2105 06/20/14 0809 06/20/14 1114  GLUCAP 335* 401* 185* 226*    Recent Results (from the past 240 hour(s))  Wound culture     Status: None (Preliminary result)   Collection Time: 06/19/14  5:05 PM  Result Value Ref Range Status   Specimen Description WOUND NECK  Final   Special Requests Immunocompromised  Final   Gram Stain   Final    RARE WBC PRESENT, PREDOMINANTLY PMN NO SQUAMOUS EPITHELIAL CELLS SEEN MODERATE GRAM POSITIVE COCCI IN PAIRS Performed at [facility]    Culture   Final    ABUNDANT STAPHYLOCOCCUS AUREUS Note: RIFAMPIN AND GENTAMICIN SHOULD NOT BE USED AS SINGLE DRUGS FOR TREATMENT OF STAPH INFECTIONS. Performed at [facility]    Report Status PENDING  Incomplete     Studies: Ct Soft Tissue Neck W Contrast  06/19/2014   CLINICAL DATA:  54 year old male with draining soft tissue infection which reportedly began as an ingrown hair.  EXAM: CT NECK WITH CONTRAST  TECHNIQUE: Multidetector CT imaging of the neck was performed using the standard protocol following the bolus administration of intravenous contrast.  CONTRAST:  OMNIPAQUE IOHEXOL 300 MG/ML  SOLN  COMPARISON:  Prior CT scan of the face and orbits 03/09/2013  FINDINGS: Pharynx and larynx: Within normal limits. No asymmetric soft tissue fullness or enhancement.  Salivary glands: Within normal limits.  Thyroid: Normal.  Lymph nodes: No pathologic adenopathy identified.  Vascular: Mild bilateral atherosclerotic calcifications in the proximal internal carotid arteries.  Limited intracranial: Within normal limits  Visualized orbits: Intact and symmetric bilaterally.  Mastoids and  visualized paranasal sinuses: Normal aeration.  Skeleton: No acute fracture or aggressive appearing lytic or blastic osseous lesion.  Upper chest: Unremarkable.  Other: Extensive skin thickening and reticulation of the subcutaneous fat beginning in the occipital skull and extending inferiorly to the upper back. Edema tracks into the trapezius and semi spinalis capitis musculature. No focal fluid collection to suggest abscess. Degenerative disc disease with posterior disc osteophyte complex at C7-T1. Mild central stenosis.  IMPRESSION: 1. CT findings are most consistent with severe cellulitis involving the posterior soft tissues of the occiput and upper cervical spine. Edema extends into the trapezius and semispinalis capitis musculature. However, there is no focal fluid collection to suggest abscess. 2. Mild atherosclerotic calcifications in the bilateral proximal internal carotid arteries. 3. Focal degenerative disc disease with mild central stenosis at C7-T1.   Electronically Signed   By: Malachy Moan M.D.   On: 06/19/2014 16:29    Scheduled Meds: . ARIPiprazole  5 mg Oral q morning - 10a  . atenolol  50 mg Oral Daily  . ciprofloxacin  400 mg Intravenous Q12H  . DULoxetine  90 mg Oral QPM  . enoxaparin (LOVENOX) injection  40 mg Subcutaneous Q24H  . gabapentin  600 mg Oral TID  . insulin aspart  0-9 Units Subcutaneous TID WC  . insulin detemir  55 Units Subcutaneous BID  . lisinopril  10 mg Oral Daily  . piperacillin-tazobactam (ZOSYN)  IV  3.375 g Intravenous Q8H  . vancomycin  1,000 mg Intravenous Q12H   Continuous Infusions: . sodium chloride 100 mL/hr at 06/19/14 1820     Time spent: 25 minutes    Syan Cullimore  [facility] Pager [phone]. If 7PM-7AM, please contact night-coverage at [email or web address], password [facility] 06/20/2014, 3:36 PM  LOS: 1 day

## 2014-06-21 DIAGNOSIS — E871 Hypo-osmolality and hyponatremia: Secondary | ICD-10-CM

## 2014-06-21 DIAGNOSIS — E1142 Type 2 diabetes mellitus with diabetic polyneuropathy: Secondary | ICD-10-CM | POA: Diagnosis not present

## 2014-06-21 DIAGNOSIS — L03221 Cellulitis of neck: Secondary | ICD-10-CM | POA: Diagnosis not present

## 2014-06-21 DIAGNOSIS — F328 Other depressive episodes: Secondary | ICD-10-CM

## 2014-06-21 LAB — WOUND CULTURE

## 2014-06-21 LAB — COMPREHENSIVE METABOLIC PANEL
ALBUMIN: 1.7 g/dL — AB (ref 3.5–5.2)
ALT: 19 U/L (ref 0–53)
ANION GAP: 5 (ref 5–15)
AST: 30 U/L (ref 0–37)
Alkaline Phosphatase: 134 U/L — ABNORMAL HIGH (ref 39–117)
BUN: 16 mg/dL (ref 6–23)
CO2: 27 mmol/L (ref 19–32)
CREATININE: 0.76 mg/dL (ref 0.50–1.35)
Calcium: 8 mg/dL — ABNORMAL LOW (ref 8.4–10.5)
Chloride: 97 mmol/L (ref 96–112)
GFR calc Af Amer: >90 mL/min (ref >90)
GFR calc non Af Amer: >90 mL/min (ref >90)
Glucose, Bld: 78 mg/dL (ref 70–99)
Potassium: 3.9 mmol/L (ref 3.5–5.1)
Sodium: 129 mmol/L — ABNORMAL LOW (ref 135–145)
Total Bilirubin: 1.1 mg/dL (ref 0.3–1.2)
Total Protein: 6.4 g/dL (ref 6.0–8.3)

## 2014-06-21 LAB — CBC
HCT: 39 % (ref 39.0–52.0)
HEMOGLOBIN: 13.8 g/dL (ref 13.0–17.0)
MCH: 31.8 pg (ref 26.0–34.0)
MCHC: 35.4 g/dL (ref 30.0–36.0)
MCV: 89.9 fL (ref 78.0–100.0)
Platelets: 279 10*3/uL (ref 150–400)
RBC: 4.34 MIL/uL (ref 4.22–5.81)
RDW: 13.5 % (ref 11.5–15.5)
WBC: 29.2 10*3/uL — ABNORMAL HIGH (ref 4.0–10.5)

## 2014-06-21 LAB — GLUCOSE, CAPILLARY
GLUCOSE-CAPILLARY: 92 mg/dL (ref 70–99)
GLUCOSE-CAPILLARY: 57 mg/dL — AB (ref 70–99)
Glucose-Capillary: 70 mg/dL (ref 70–99)
Glucose-Capillary: 90 mg/dL (ref 70–99)

## 2014-06-21 LAB — HIV ANTIBODY (ROUTINE TESTING W REFLEX): HIV Screen 4th Generation wRfx: NONREACTIVE

## 2014-06-21 LAB — HEMOGLOBIN A1C
Hgb A1c MFr Bld: 11.5 % — ABNORMAL HIGH (ref 4.8–5.6)
MEAN PLASMA GLUCOSE: 283 mg/dL

## 2014-06-21 LAB — PHOSPHORUS: Phosphorus: 3.1 mg/dL (ref 2.3–4.6)

## 2014-06-21 LAB — MAGNESIUM: Magnesium: 2.3 mg/dL (ref 1.5–2.5)

## 2014-06-21 LAB — PATHOLOGIST SMEAR REVIEW

## 2014-06-21 MED ORDER — CEFAZOLIN SODIUM-DEXTROSE 2-3 GM-% IV SOLR
2.0000 g | Freq: Four times a day (QID) | INTRAVENOUS | Status: DC
  Administered 2014-06-21 – 2014-06-22 (×3): 2 g via INTRAVENOUS
  Filled 2014-06-21 (×5): qty 50

## 2014-06-21 MED ORDER — SODIUM CHLORIDE 0.9 % IV SOLN
INTRAVENOUS | Status: DC
  Administered 2014-06-22: 14:00:00 via INTRAVENOUS

## 2014-06-21 MED ORDER — INSULIN DETEMIR 100 UNIT/ML ~~LOC~~ SOLN
45.0000 [IU] | Freq: Two times a day (BID) | SUBCUTANEOUS | Status: DC
  Filled 2014-06-21 (×2): qty 0.45

## 2014-06-21 NOTE — Progress Notes (Addendum)
Pharmacy consulted to change cipro/vanc/zosyn to ancef for MSSA cellulitis.  Wt 107 kg. WBC remains elevated at 29.2, creat 0.75. AF Plan: ancef 2 gm IV q6h Pharmacy will sign off. Thanks Herby AbrahamMichelle T. Bell, Pharm.D. [phone] 06/21/2014 3:08 PM   Will adjust cefazolin to 2g IV q8h which should be fine for his renal function.  Celedonio MiyamotoJeremy Olive Motyka, PharmD, BCPS Clinical Pharmacist Pager [phone]

## 2014-06-21 NOTE — Progress Notes (Signed)
[facility] PROGRESS NOTE  [name] ZOX:[id number] DOB: 21-Jun-1960 DOA: 06/19/2014 PCP: No PCP Per Patient  Summary I have seen and examined Mr Eric Shaw at bedside and reviewed his chart. Eric Shaw is a pleasant 54 year old male with known type 2 diabetes mellitus, last known A1c in August 2014 was 9.8, depression, suicidal ideations in the past and requiring behavioral health hospitalization, morbid obesity, hypertension, who presented to [facility] emergency department with main concern of progressively worsening neck pain, swelling, redness, tenderness to touch for the last 2-1/2 weeks and he was found to have severe cellulitis with sepsis, white count greater than 33,000. CT of there neck showed "1. CT findings are most consistent with severe cellulitis involving the posterior soft tissues of the occiput and upper cervical spine. Edema extends into the trapezius and semispinalis capitis musculature. However, there is no focal fluid collection to suggest abscess. 2. Mild atherosclerotic calcifications in the bilateral proximal internal carotid arteries. 3. Focal degenerative disc disease with mild central stenosis at C7-T1". Blood culture/wound culture grew staph aureus. Sensitivity pending. Given the degree of cellulitis, will keep on triple antibiotics pending final culture results. White count has improved slightly to 29,000. He reports feeling better. Appreciate diabetes coordinator.  Plan  MSSA cellulitis  Follow septic workup including blood culture/wound culture. Chest x-ray.  Wasn't vancomycin/Zosyn/ciprofloxacin. Grew MSSA, cefazolin is recommended.  Discontinue previous antibiotics and start on cefazolin.  Hyponatremia  Probably pseudohyponatremia related to hyperglycemia. Improving. Expect continued improvement with treatment of infection and hydration.  Treat with IV fluids.  DM2 (diabetes mellitus, type 2)/HTN  Appreciate diabetic coordinator  BP low  normal  Levemir/SSI for now, adjust as necessary. Would follow diabetic coordinator recommendations at DC.  Follow HbA1C.  Continue atenolol  Diabetic neuropathy/MDD (major depressive disorder)  No acute changes  Code Status: Full code Family Communication: No family at bedside Disposition Plan: Eventually home   Consultants:  None  Procedures:  None  Antibiotics:  Vancomycin 06/19/2014>  Zosyn 06/19/2014>  Ciprofloxacin 06/20/2014>  HPI/Subjective: Feels a little bit better. His appetite still poor.  Objective: Filed Vitals:   06/21/14 1356  BP: 127/76  Pulse: 91  Temp: 100.4 F (38 C)  Resp: 20    Intake/Output Summary (Last 24 hours) at 06/21/14 1436 Last data filed at 06/21/14 1008  Gross per 24 hour  Intake   2930 ml  Output    700 ml  Net   2230 ml   Filed Weights   06/19/14 1214 06/19/14 1827  Weight: 106.595 kg (235 lb) 106.595 kg (235 lb)    Exam:   General:  Comfortable at rest. Cellulitis of the neck with mild drainage.  Cardiovascular: S1-S2 normal. No murmurs. Pulse regular.  Respiratory: Good air entry bilaterally. No rhonchi or rales.  Abdomen: Soft and nontender. Normal bowel sounds. No organomegaly.  Musculoskeletal: No pedal edema   Neurological: Intact  Data Reviewed: Basic Metabolic Panel:  Recent Labs Lab 06/19/14 1311 06/20/14 0540 06/21/14 0753  NA 121* 127* 129*  K 4.8 4.2 3.9  CL 86* 96 97  CO2 22 17* 27  GLUCOSE 420* 211* 78  BUN CREATININE 1.15 0.80 0.76  CALCIUM 8.5 8.2* 8.0*  MG  --   --  2.3  PHOS  --   --  3.1   Liver Function Tests:  Recent Labs Lab 06/19/14 1311 06/21/14 0753  AST 28 30  ALT 23 19  ALKPHOS 178* 134*  BILITOT 1.1 1.1  PROT 7.2  6.4  ALBUMIN 1.8* 1.7*   No results for input(s): LIPASE, AMYLASE in the last 168 hours. No results for input(s): AMMONIA in the last 168 hours. CBC:  Recent Labs Lab 06/19/14 1311 06/20/14 0540 06/21/14 0753  WBC 33.8*  29.6* 29.2*  NEUTROABS 30.4*  --   --   HGB 14.9 14.6 13.8  HCT 41.2 41.2 39.0  MCV 86.2 86.9 89.9  PLT 328 302 279   Cardiac Enzymes: No results for input(s): CKTOTAL, CKMB, CKMBINDEX, TROPONINI in the last 168 hours. BNP (last 3 results) No results for input(s): BNP in the last 8760 hours.  ProBNP (last 3 results) No results for input(s): PROBNP in the last 8760 hours.  CBG:  Recent Labs Lab 06/20/14 1637 06/20/14 2146 06/21/14 0741 06/21/14 1014 06/21/14 1145  GLUCAP 117* 160* 70 90 92    Recent Results (from the past 240 hour(s))  Wound culture     Status: None   Collection Time: 06/19/14  5:05 PM  Result Value Ref Range Status   Specimen Description WOUND NECK  Final   Special Requests Immunocompromised  Final   Gram Stain   Final    RARE WBC PRESENT, PREDOMINANTLY PMN NO SQUAMOUS EPITHELIAL CELLS SEEN MODERATE GRAM POSITIVE COCCI IN PAIRS Performed at [facility]    Culture   Final    ABUNDANT STAPHYLOCOCCUS AUREUS Note: RIFAMPIN AND GENTAMICIN SHOULD NOT BE USED AS SINGLE DRUGS FOR TREATMENT OF STAPH INFECTIONS. Performed at [facility]    Report Status 06/21/2014 FINAL  Final   Organism ID, Bacteria STAPHYLOCOCCUS AUREUS  Final      Susceptibility   Staphylococcus aureus - MIC*    CLINDAMYCIN <=0.25 SENSITIVE Sensitive     ERYTHROMYCIN <=0.25 SENSITIVE Sensitive     GENTAMICIN <=0.5 SENSITIVE Sensitive     LEVOFLOXACIN 0.25 SENSITIVE Sensitive     OXACILLIN 0.5 SENSITIVE Sensitive     PENICILLIN >=0.5 RESISTANT Resistant     RIFAMPIN <=0.5 SENSITIVE Sensitive     TRIMETH/SULFA <=10 SENSITIVE Sensitive     VANCOMYCIN <=0.5 SENSITIVE Sensitive     TETRACYCLINE <=1 SENSITIVE Sensitive     MOXIFLOXACIN <=0.25 SENSITIVE Sensitive     * ABUNDANT STAPHYLOCOCCUS AUREUS  Culture, blood (routine x 2)     Status: None (Preliminary result)   Collection Time: 06/19/14  6:46 PM  Result Value Ref Range Status   Specimen Description BLOOD LEFT  ARM  Final   Special Requests BOTTLES DRAWN AEROBIC AND ANAEROBIC 6CC  Final   Culture   Final           BLOOD CULTURE RECEIVED NO GROWTH TO DATE CULTURE WILL BE HELD FOR 5 DAYS BEFORE ISSUING A FINAL NEGATIVE REPORT Performed at [facility]    Report Status PENDING  Incomplete  Culture, blood (routine x 2)     Status: None (Preliminary result)   Collection Time: 06/19/14  7:02 PM  Result Value Ref Range Status   Specimen Description BLOOD LEFT HAND  Final   Special Requests   Final    BOTTLES DRAWN AEROBIC AND ANAEROBIC 5CC BLUE 4CC PURPLE   Culture   Final           BLOOD CULTURE RECEIVED NO GROWTH TO DATE CULTURE WILL BE HELD FOR 5 DAYS BEFORE ISSUING A FINAL NEGATIVE REPORT Performed at [facility]    Report Status PENDING  Incomplete     Studies: Ct Soft Tissue Neck W Contrast  06/19/2014  CLINICAL DATA:  54 year old male with draining soft tissue infection which reportedly began as an ingrown hair.  EXAM: CT NECK WITH CONTRAST  TECHNIQUE: Multidetector CT imaging of the neck was performed using the standard protocol following the bolus administration of intravenous contrast.  CONTRAST:  OMNIPAQUE IOHEXOL 300 MG/ML  SOLN  COMPARISON:  Prior CT scan of the face and orbits 03/09/2013  FINDINGS: Pharynx and larynx: Within normal limits. No asymmetric soft tissue fullness or enhancement.  Salivary glands: Within normal limits.  Thyroid: Normal.  Lymph nodes: No pathologic adenopathy identified.  Vascular: Mild bilateral atherosclerotic calcifications in the proximal internal carotid arteries.  Limited intracranial: Within normal limits  Visualized orbits: Intact and symmetric bilaterally.  Mastoids and visualized paranasal sinuses: Normal aeration.  Skeleton: No acute fracture or aggressive appearing lytic or blastic osseous lesion.  Upper chest: Unremarkable.  Other: Extensive skin thickening and reticulation of the subcutaneous fat beginning in the occipital skull  and extending inferiorly to the upper back. Edema tracks into the trapezius and semi spinalis capitis musculature. No focal fluid collection to suggest abscess. Degenerative disc disease with posterior disc osteophyte complex at C7-T1. Mild central stenosis.  IMPRESSION: 1. CT findings are most consistent with severe cellulitis involving the posterior soft tissues of the occiput and upper cervical spine. Edema extends into the trapezius and semispinalis capitis musculature. However, there is no focal fluid collection to suggest abscess. 2. Mild atherosclerotic calcifications in the bilateral proximal internal carotid arteries. 3. Focal degenerative disc disease with mild central stenosis at C7-T1.   Electronically Signed   By: Malachy Moan M.D.   On: 06/19/2014 16:29   Dg Chest Port 1 View  06/20/2014   CLINICAL DATA:  Shortness of breath and weakness  EXAM: PORTABLE CHEST - 1 VIEW  COMPARISON:  01/19/2013  FINDINGS: The heart size and mediastinal contours are at upper limits of normal, unchanged. Both lungs are clear allowing for hypoaeration and crowding of the bronchovascular markings. The visualized skeletal structures are unremarkable.  IMPRESSION: Low lung volumes with crowding of the bronchovascular markings but no focal acute finding.   Electronically Signed   By: Christiana Pellant M.D.   On: 06/20/2014 16:37    Scheduled Meds: . ARIPiprazole  5 mg Oral q morning - 10a  . atenolol  50 mg Oral Daily  . DULoxetine  90 mg Oral QPM  . enoxaparin (LOVENOX) injection  40 mg Subcutaneous Q24H  . gabapentin  600 mg Oral TID  . insulin aspart  0-9 Units Subcutaneous TID WC  . insulin detemir  55 Units Subcutaneous BID  . lisinopril  10 mg Oral Daily  . piperacillin-tazobactam (ZOSYN)  IV  3.375 g Intravenous Q8H  . vancomycin  1,000 mg Intravenous Q12H   Continuous Infusions: . sodium chloride 1,000 mL (06/20/14 1701)     Time spent: 25 minutes    [facility]  [facility] Pager [phone]. If 7PM-7AM, please contact night-coverage at [email or web address], password [facility] 06/21/2014, 2:36 PM  LOS: 2 days

## 2014-06-21 NOTE — Progress Notes (Signed)
Inpatient Diabetes Program Recommendations  AACE/ADA: New Consensus Statement on Inpatient Glycemic Control (2013)  Target Ranges:  Prepandial:   less than 140 mg/dL      Peak postprandial:   less than 180 mg/dL (1-2 hours)      Critically ill patients:  140 - 180 mg/dL   Results for Eric Shaw, Eric Shaw (MRN [id number]) as of 06/21/2014 18:02  Ref. Range 06/20/2014 08:09 06/20/2014 11:14 06/20/2014 16:37 06/20/2014 21:46 06/21/2014 07:41 06/21/2014 10:14 06/21/2014 11:45 06/21/2014 17:51  Glucose-Capillary Latest Range: 70-99 mg/dL 409185 (H) 811226 (H) 914117 (H) 160 (H) 70 90 92 57 (L)   Current orders for Inpatient glycemic control: Levemir 55 units BID, Novolog 0-9 units TID with meals  Inpatient Diabetes Program Recommendations Insulin - Basal: Note morning dose of Levemir was held due to fasting glucose of 70 mg/dl this morning. Noted glucose of 57 mg/dl at 78:2917:51 today and patient has poor PO intake. Please consider decreasing Levemir to 45 units QHS. Will follow trends and make additional recommendations as more data is collected.  Thanks, Orlando PennerMarie Johm Pfannenstiel, RN, MSN, CCRN, CDE Diabetes Coordinator Inpatient Diabetes Program [phone] (Team Pager) [phone] ([facility] office) [phone] [facility] office)

## 2014-06-22 DIAGNOSIS — F328 Other depressive episodes: Secondary | ICD-10-CM | POA: Diagnosis not present

## 2014-06-22 DIAGNOSIS — E871 Hypo-osmolality and hyponatremia: Secondary | ICD-10-CM | POA: Diagnosis not present

## 2014-06-22 DIAGNOSIS — L03221 Cellulitis of neck: Secondary | ICD-10-CM | POA: Diagnosis not present

## 2014-06-22 DIAGNOSIS — E1142 Type 2 diabetes mellitus with diabetic polyneuropathy: Secondary | ICD-10-CM | POA: Diagnosis not present

## 2014-06-22 LAB — BASIC METABOLIC PANEL
Anion gap: 5 (ref 5–15)
BUN: 8 mg/dL (ref 6–23)
CO2: 25 mmol/L (ref 19–32)
Calcium: 7.8 mg/dL — ABNORMAL LOW (ref 8.4–10.5)
Chloride: 100 mmol/L (ref 96–112)
Creatinine, Ser: 0.61 mg/dL (ref 0.50–1.35)
GFR calc Af Amer: >90 mL/min (ref >90)
GFR calc non Af Amer: >90 mL/min (ref >90)
GLUCOSE: 77 mg/dL (ref 70–99)
Potassium: 3.7 mmol/L (ref 3.5–5.1)
Sodium: 130 mmol/L — ABNORMAL LOW (ref 135–145)

## 2014-06-22 LAB — CBC
HCT: 36.4 % — ABNORMAL LOW (ref 39.0–52.0)
Hemoglobin: 12.5 g/dL — ABNORMAL LOW (ref 13.0–17.0)
MCH: 30 pg (ref 26.0–34.0)
MCHC: 34.3 g/dL (ref 30.0–36.0)
MCV: 87.5 fL (ref 78.0–100.0)
Platelets: 305 10*3/uL (ref 150–400)
RBC: 4.16 MIL/uL — ABNORMAL LOW (ref 4.22–5.81)
RDW: 13.5 % (ref 11.5–15.5)
WBC: 21.6 10*3/uL — ABNORMAL HIGH (ref 4.0–10.5)

## 2014-06-22 LAB — GLUCOSE, CAPILLARY
Glucose-Capillary: 117 mg/dL — ABNORMAL HIGH (ref 70–99)
Glucose-Capillary: 71 mg/dL (ref 70–99)
Glucose-Capillary: 134 mg/dL — ABNORMAL HIGH (ref 70–99)
Glucose-Capillary: 166 mg/dL — ABNORMAL HIGH (ref 70–99)
Glucose-Capillary: 227 mg/dL — ABNORMAL HIGH (ref 70–99)

## 2014-06-22 MED ORDER — INSULIN GLARGINE 100 UNIT/ML ~~LOC~~ SOLN
50.0000 [IU] | Freq: Every day | SUBCUTANEOUS | Status: DC
  Administered 2014-06-22: 50 [IU] via SUBCUTANEOUS
  Filled 2014-06-22 (×2): qty 0.5

## 2014-06-22 MED ORDER — CEFAZOLIN SODIUM-DEXTROSE 2-3 GM-% IV SOLR
2.0000 g | Freq: Three times a day (TID) | INTRAVENOUS | Status: DC
  Administered 2014-06-22 – 2014-06-24 (×6): 2 g via INTRAVENOUS
  Filled 2014-06-22 (×9): qty 50

## 2014-06-22 MED ORDER — POTASSIUM CHLORIDE CRYS ER 20 MEQ PO TBCR
40.0000 meq | EXTENDED_RELEASE_TABLET | Freq: Once | ORAL | Status: AC
  Administered 2014-06-22: 40 meq via ORAL
  Filled 2014-06-22: qty 2

## 2014-06-22 NOTE — Progress Notes (Signed)
PROGRESS NOTE  Eric Shaw ZOX:[id number] DOB: 25-Dec-1960 DOA: 06/19/2014 PCP: No PCP Per Patient  HPI/Subjective: Eric Shaw is a 54 year old African American male with a history of type 2 diabetes mellitus, depression, obesity, and hypertension. Last Hg A1C 06/19/2014 11.5%. He presented to the ED on 06/19/2014 with worsening neck pain, swelling, tenderness to touch and redness. States it started as an ingrown hair 2 1/2 weeks ago.Keeps neck flexed to decrease pain, is able to minimally flex/extend and rotate neck when asked. Patient is a daily smoker.  Overall pt is doing better, feels more awake today. Pain is currently at a 7/10 due to recent physical exam by Dr. Arthor Captain. Pt states decreased swelling of posterior neck, but it feels more painful compared to yesterday. States he has little appetite and a headache in the Left temporal area.   Review of Systems - History obtained from the patient and chart reviewed General ROS: negative for - chills, fatigue, fever or sleep disturbance Psychological ROS: negative for - depression, irritability or sleep disturbances, hallucinations Ophthalmic ROS: negative for - blurry vision or decreased vision ENT ROS: negative for - sore throat Respiratory ROS: no cough, shortness of breath, or wheezing Cardiovascular ROS: no chest pain or dyspnea on exertion Gastrointestinal ROS: positive for decreased appetite; no abdominal pain, change in bowel habits, or black or bloody stools Genito-Urinary ROS: no dysuria, trouble voiding, or hematuria Neurological ROS: positive for - headache on L temporal aspect negative for - numbness/tingling, tremors or weakness Dermatological ROS: positive for swelling with erythema and tenderness of posterior neck negative for dry skin and pruritus Endocrine ROS: negative for extreme increased thirst  Labs notable for:   WBC (21.6), trending downwards  Decreased Calcium (7.8), trending downwards  Decreased Sodium  (130), trending upwards   Assessment/Plan:  Posterior Neck Cellulitis  Edematous, erythematous, tender to palpation open spots with active yellow draining for 2.5 weeks Would culture: MSSA Blood culture pending  Continue Ancef 2g IV Q6 hours. Discontinued Ciprofloxicin, Vancomycin, and Zosyn yesterday Pain meds as prescribed PRN   Type 2 Diabetes Mellitus  Diabetes coordinator spoke to patient yesterday, will decrease insulin because of fasting glucose and poor PO intake SSI only due to decrease CBG's Neurontin  TID and Cymbalta  QPM  Hypertension  Continue Lisinopril  daily and Atenolol  daily  Depression Stable on exam today Continue Abilify       DVT Prophylaxis: Lovenox 40 mg  Code Status: Full Family Communication: no family in the room Disposition Plan: Continue inpatient   Consultants:  none  Procedures:  none  Antibiotics:  Ancef 2g IV 4 times a day  Objective: Filed Vitals:   06/21/14 0533 06/21/14 1356 06/21/14 2222 06/22/14 0727  BP: 125/72 127/76 102/51 122/63  Pulse: 92 91 94 85  Temp: 98.1 F (36.7 C) 100.4 F (38 C) 99.5 F (37.5 C) 97.8 F (36.6 C)  TempSrc: Oral Oral Oral Oral  Resp: Height:      Weight:      SpO2: 98% 97% 97% 98%    Intake/Output Summary (Last 24 hours) at 06/22/14 1010 Last data filed at 06/21/14 1830  Gross per 24 hour  Intake    995 ml  Output    450 ml  Net    545 ml   Filed Weights   06/19/14 1214 06/19/14 1827  Weight: 106.595 kg (235 lb) 106.595 kg (235 lb)    Exam: General: Well developed, well nourished,  NAD, appears stated age  HEENT:  PERR, EOMI, Anicteic Sclera, dry mucous membranes. No pharyngeal erythema or exudates  Neck: Supple, no JVD, no masses  Cardiovascular: RRR, S1 S2 auscultated, no rubs, murmurs or gallops.   Respiratory: Clear to auscultation bilaterally with equal chest rise  Abdomen: Soft, nontender, nondistended, + bowel sounds. No  tenderness or guarding. No CVA tenderness Extremities: warm dry without cyanosis clubbing or edema. 2+ pedal pulses Neuro: AAOx3, Strength 5/5 in upper extremities  Skin: Significant edematous posterior neck with erythema, significant yellow drainage with blood, tenderness to palpation, several open spots coalesced into one. Psych: Normal affect and demeanor with intact judgement and insight   Data Reviewed: Basic Metabolic Panel:  Recent Labs Lab 06/19/14 1311 06/20/14 0540 06/21/14 0753 06/22/14 0831  NA 121* 127* 129* 130*  K 4.8 4.2 3.9 3.7  CL 86* 96 97 100  CO2 22 17* 27 25  GLUCOSE 420* 211* 78 77  BUN CREATININE 1.15 0.80 0.76 0.61  CALCIUM 8.5 8.2* 8.0* 7.8*  MG  --   --  2.3  --   PHOS  --   --  3.1  --    Liver Function Tests:  Recent Labs Lab 06/19/14 1311 06/21/14 0753  AST 28 30  ALT 23 19  ALKPHOS 178* 134*  BILITOT 1.1 1.1  PROT 7.2 6.4  ALBUMIN 1.8* 1.7*   CBC:  Recent Labs Lab 06/19/14 1311 06/20/14 0540 06/21/14 0753 06/22/14 0831  WBC 33.8* 29.6* 29.2* 21.6*  NEUTROABS 30.4*  --   --   --   HGB 14.9 14.6 13.8 12.5*  HCT 41.2 41.2 39.0 36.4*  MCV 86.2 86.9 89.9 87.5  PLT 328 302 279 305   CBG:  Recent Labs Lab 06/21/14 1014 06/21/14 1145 06/21/14 1751 06/21/14 2218 06/22/14 0803  GLUCAP 90 92 57* 117* 71    Recent Results (from the past 240 hour(s))  Wound culture     Status: None   Collection Time: 06/19/14  5:05 PM  Result Value Ref Range Status   Specimen Description WOUND NECK  Final   Special Requests Immunocompromised  Final   Gram Stain   Final    RARE WBC PRESENT, PREDOMINANTLY PMN NO SQUAMOUS EPITHELIAL CELLS SEEN MODERATE GRAM POSITIVE COCCI IN PAIRS Performed at [facility]    Culture   Final    ABUNDANT STAPHYLOCOCCUS AUREUS Note: RIFAMPIN AND GENTAMICIN SHOULD NOT BE USED AS SINGLE DRUGS FOR TREATMENT OF STAPH INFECTIONS. Performed at [facility]    Report Status  06/21/2014 FINAL  Final   Organism ID, Bacteria STAPHYLOCOCCUS AUREUS  Final      Susceptibility   Staphylococcus aureus - MIC*    CLINDAMYCIN <=0.25 SENSITIVE Sensitive     ERYTHROMYCIN <=0.25 SENSITIVE Sensitive     GENTAMICIN <=0.5 SENSITIVE Sensitive     LEVOFLOXACIN 0.25 SENSITIVE Sensitive     OXACILLIN 0.5 SENSITIVE Sensitive     PENICILLIN >=0.5 RESISTANT Resistant     RIFAMPIN <=0.5 SENSITIVE Sensitive     TRIMETH/SULFA <=10 SENSITIVE Sensitive     VANCOMYCIN <=0.5 SENSITIVE Sensitive     TETRACYCLINE <=1 SENSITIVE Sensitive     MOXIFLOXACIN <=0.25 SENSITIVE Sensitive     * ABUNDANT STAPHYLOCOCCUS AUREUS  Culture, blood (routine x 2)     Status: None (Preliminary result)   Collection Time: 06/19/14  6:46 PM  Result Value Ref Range Status   Specimen Description BLOOD LEFT ARM  Final  Special Requests BOTTLES DRAWN AEROBIC AND ANAEROBIC 6CC  Final   Culture   Final           BLOOD CULTURE RECEIVED NO GROWTH TO DATE CULTURE WILL BE HELD FOR 5 DAYS BEFORE ISSUING A FINAL NEGATIVE REPORT Performed at [facility]    Report Status PENDING  Incomplete  Culture, blood (routine x 2)     Status: None (Preliminary result)   Collection Time: 06/19/14  7:02 PM  Result Value Ref Range Status   Specimen Description BLOOD LEFT HAND  Final   Special Requests   Final    BOTTLES DRAWN AEROBIC AND ANAEROBIC 5CC BLUE 4CC PURPLE   Culture   Final           BLOOD CULTURE RECEIVED NO GROWTH TO DATE CULTURE WILL BE HELD FOR 5 DAYS BEFORE ISSUING A FINAL NEGATIVE REPORT Performed at [facility]    Report Status PENDING  Incomplete     Studies: Dg Chest Port 1 View  06/20/2014   CLINICAL DATA:  Shortness of breath and weakness  EXAM: PORTABLE CHEST - 1 VIEW  COMPARISON:  01/19/2013  FINDINGS: The heart size and mediastinal contours are at upper limits of normal, unchanged. Both lungs are clear allowing for hypoaeration and crowding of the bronchovascular markings. The  visualized skeletal structures are unremarkable.  IMPRESSION: Low lung volumes with crowding of the bronchovascular markings but no focal acute finding.   Electronically Signed   By: Christiana PellantGretchen  Green M.D.   On: 06/20/2014 16:37    Scheduled Meds: . ARIPiprazole  5 mg Oral q morning - 10a  . atenolol  50 mg Oral Daily  .  ceFAZolin (ANCEF) IV  2 g Intravenous 4 times per day  . DULoxetine  90 mg Oral QPM  . enoxaparin (LOVENOX) injection  40 mg Subcutaneous Q24H  . gabapentin  600 mg Oral TID  . insulin aspart  0-9 Units Subcutaneous TID WC  . lisinopril  10 mg Oral Daily   Continuous Infusions: . sodium chloride 1,000 mL (06/21/14 1709)    Active Problems:   DM2 (diabetes mellitus, type 2)   Diabetic neuropathy   MDD (major depressive disorder)   Cellulitis   Staphylococcal sepsis   Hyponatremia    Jeannetta EllisYoung, Kathryn, PA-S [facility] 06/22/2014, 10:10 AM   Addendum  Patient seen and examined, chart and data base reviewed.  I agree with the above assessment and plan.  For full details please see Mrs. Jeannetta EllisYoung, Kathryn, PA-S note.  I reviewed and amended the above note as appropriate.   Clint LippsMutaz A Pakou Rainbow, MD [facility] Pager: [phone] 06/22/2014, 2:49 PM

## 2014-06-22 NOTE — Progress Notes (Signed)
[facility] PROGRESS NOTE  Eric Shaw ZOX:[id number] DOB: Oct 17, 1960 DOA: 06/19/2014 PCP: No PCP Per Patient  Summary I have seen and examined Eric Shaw at bedside and reviewed his chart. Eric Shaw is a pleasant 54 year old male with known type 2 diabetes mellitus, last known A1c in August 2014 was 9.8, depression, suicidal ideations in the past and requiring behavioral health hospitalization, morbid obesity, hypertension, who presented to [facility] emergency department with main concern of progressively worsening neck pain, swelling, redness, tenderness to touch for the last 2-1/2 weeks and he was found to have severe cellulitis with sepsis, white count greater than 33,000. CT of there neck showed "1. CT findings are most consistent with severe cellulitis involving the posterior soft tissues of the occiput and upper cervical spine. Edema extends into the trapezius and semispinalis capitis musculature. However, there is no focal fluid collection to suggest abscess. 2. Mild atherosclerotic calcifications in the bilateral proximal internal carotid arteries. 3. Focal degenerative disc disease with mild central stenosis at C7-T1". Blood culture/wound culture grew staph aureus. Sensitivity pending. Given the degree of cellulitis, will keep on triple antibiotics pending final culture results. White count has improved slightly to 29,000. He reports feeling better. Appreciate diabetes coordinator.  Plan  MSSA cellulitis, involving the back of the neck  Presented with induration, swelling and weeping serous fluids the pedicle of the neck.  Wasn't vancomycin/Zosyn/ciprofloxacin. Grew MSSA, cefazolin is recommended.  Started on 2 g of cefazolin every 8 hours.  Still has significant leukocytosis, continue current antibiotics, check CBC in a.m.  Hyponatremia  Expect continued improvement with treatment of infection and hydration.  Continue hydration with IV fluids, check BMP in  a.m.  DM2 (diabetes mellitus, type 2) uncontrolled  Hemoglobin A1c is 11.5 correlate with a glucose of 289.  Patient was not taking his insulin at home, started back on 55 units twice a day, develop hypoglycemia.  Start 50 units daily at bedtime  Diabetic neuropathy/MDD (major depressive disorder)  No acute changes  Code Status: Full code Family Communication: No family at bedside Disposition Plan: Eventually home   Consultants:  None  Procedures:  None  Antibiotics:  Vancomycin 06/19/2014>  Zosyn 06/19/2014>  Ciprofloxacin 06/20/2014>  HPI/Subjective: Feels a little bit better. His appetite still poor.  Objective: Filed Vitals:   06/22/14 1425  BP: 115/61  Pulse:   Temp: 98.7 F (37.1 C)  Resp: 16    Intake/Output Summary (Last 24 hours) at 06/22/14 1450 Last data filed at 06/22/14 1256  Gross per 24 hour  Intake 1048.33 ml  Output   1150 ml  Net -101.67 ml   Filed Weights   06/19/14 1214 06/19/14 1827  Weight: 106.595 kg (235 lb) 106.595 kg (235 lb)    Exam:   General:  Comfortable at rest. Cellulitis of the neck with mild drainage.  Cardiovascular: S1-S2 normal. No murmurs. Pulse regular.  Respiratory: Good air entry bilaterally. No rhonchi or rales.  Abdomen: Soft and nontender. Normal bowel sounds. No organomegaly.  Musculoskeletal: No pedal edema   Neurological: Intact  Data Reviewed: Basic Metabolic Panel:  Recent Labs Lab 06/19/14 1311 06/20/14 0540 06/21/14 0753 06/22/14 0831  NA 121* 127* 129* 130*  K 4.8 4.2 3.9 3.7  CL 86* 96 97 100  CO2 22 17* 27 25  GLUCOSE 420* 211* 78 77  BUN CREATININE 1.15 0.80 0.76 0.61  CALCIUM 8.5 8.2* 8.0* 7.8*  MG  --   --  2.3  --  PHOS  --   --  3.1  --    Liver Function Tests:  Recent Labs Lab 06/19/14 1311 06/21/14 0753  AST 28 30  ALT 23 19  ALKPHOS 178* 134*  BILITOT 1.1 1.1  PROT 7.2 6.4  ALBUMIN 1.8* 1.7*   No results for input(s): LIPASE, AMYLASE in  the last 168 hours. No results for input(s): AMMONIA in the last 168 hours. CBC:  Recent Labs Lab 06/19/14 1311 06/20/14 0540 06/21/14 0753 06/22/14 0831  WBC 33.8* 29.6* 29.2* 21.6*  NEUTROABS 30.4*  --   --   --   HGB 14.9 14.6 13.8 12.5*  HCT 41.2 41.2 39.0 36.4*  MCV 86.2 86.9 89.9 87.5  PLT 328 302 279 305   Cardiac Enzymes: No results for input(s): CKTOTAL, CKMB, CKMBINDEX, TROPONINI in the last 168 hours. BNP (last 3 results) No results for input(s): BNP in the last 8760 hours.  ProBNP (last 3 results) No results for input(s): PROBNP in the last 8760 hours.  CBG:  Recent Labs Lab 06/21/14 1145 06/21/14 1751 06/21/14 2218 06/22/14 0803 06/22/14 1251  GLUCAP 92 57* 117* 71 134*    Recent Results (from the past 240 hour(s))  Wound culture     Status: None   Collection Time: 06/19/14  5:05 PM  Result Value Ref Range Status   Specimen Description WOUND NECK  Final   Special Requests Immunocompromised  Final   Gram Stain   Final    RARE WBC PRESENT, PREDOMINANTLY PMN NO SQUAMOUS EPITHELIAL CELLS SEEN MODERATE GRAM POSITIVE COCCI IN PAIRS Performed at [facility]    Culture   Final    ABUNDANT STAPHYLOCOCCUS AUREUS Note: RIFAMPIN AND GENTAMICIN SHOULD NOT BE USED AS SINGLE DRUGS FOR TREATMENT OF STAPH INFECTIONS. Performed at [facility]    Report Status 06/21/2014 FINAL  Final   Organism ID, Bacteria STAPHYLOCOCCUS AUREUS  Final      Susceptibility   Staphylococcus aureus - MIC*    CLINDAMYCIN <=0.25 SENSITIVE Sensitive     ERYTHROMYCIN <=0.25 SENSITIVE Sensitive     GENTAMICIN <=0.5 SENSITIVE Sensitive     LEVOFLOXACIN 0.25 SENSITIVE Sensitive     OXACILLIN 0.5 SENSITIVE Sensitive     PENICILLIN >=0.5 RESISTANT Resistant     RIFAMPIN <=0.5 SENSITIVE Sensitive     TRIMETH/SULFA <=10 SENSITIVE Sensitive     VANCOMYCIN <=0.5 SENSITIVE Sensitive     TETRACYCLINE <=1 SENSITIVE Sensitive     MOXIFLOXACIN <=0.25 SENSITIVE Sensitive      * ABUNDANT STAPHYLOCOCCUS AUREUS  Culture, blood (routine x 2)     Status: None (Preliminary result)   Collection Time: 06/19/14  6:46 PM  Result Value Ref Range Status   Specimen Description BLOOD LEFT ARM  Final   Special Requests BOTTLES DRAWN AEROBIC AND ANAEROBIC 6CC  Final   Culture   Final           BLOOD CULTURE RECEIVED NO GROWTH TO DATE CULTURE WILL BE HELD FOR 5 DAYS BEFORE ISSUING A FINAL NEGATIVE REPORT Performed at [facility]    Report Status PENDING  Incomplete  Culture, blood (routine x 2)     Status: None (Preliminary result)   Collection Time: 06/19/14  7:02 PM  Result Value Ref Range Status   Specimen Description BLOOD LEFT HAND  Final   Special Requests   Final    BOTTLES DRAWN AEROBIC AND ANAEROBIC 5CC BLUE 4CC PURPLE   Culture   Final  BLOOD CULTURE RECEIVED NO GROWTH TO DATE CULTURE WILL BE HELD FOR 5 DAYS BEFORE ISSUING A FINAL NEGATIVE REPORT [facility]    Report Status PENDING  Incomplete     Studies: Dg Chest Port 1 View  06/20/2014   CLINICAL DATA:  Shortness of breath and weakness  EXAM: PORTABLE CHEST - 1 VIEW  COMPARISON:  01/19/2013  FINDINGS: The heart size and mediastinal contours are at upper limits of normal, unchanged. Both lungs are clear allowing for hypoaeration and crowding of the bronchovascular markings. The visualized skeletal structures are unremarkable.  IMPRESSION: Low lung volumes with crowding of the bronchovascular markings but no focal acute finding.   Electronically Signed   By: Christiana PellantGretchen  Green M.D.   On: 06/20/2014 16:37    Scheduled Meds: . ARIPiprazole  5 mg Oral q morning - 10a  . atenolol  50 mg Oral Daily  .  ceFAZolin (ANCEF) IV  2 g Intravenous 3 times per day  . DULoxetine  90 mg Oral QPM  . enoxaparin (LOVENOX) injection  40 mg Subcutaneous Q24H  . gabapentin  600 mg Oral TID  . insulin aspart  0-9 Units Subcutaneous TID WC  . lisinopril  10 mg Oral Daily   Continuous  Infusions: . sodium chloride 100 mL/hr at 06/22/14 1338     Time spent: 25 minutes    [facility] Hospital[facility]MAHI,Norman Piacentini A  [facility] Pager [phone]. If 7PM-7AM, please contact night-coverage at [email or web address], password [email or web address] 06/22/2014, 2:50 PM  LOS: 3 days

## 2014-06-22 NOTE — Progress Notes (Signed)
Results for [name], Devarious (MRN [id number]) as of 06/22/2014 11:46  Ref. Range 06/21/2014 07:41 06/21/2014 10:14 06/21/2014 11:45 06/21/2014 17:51 06/21/2014 22:18 06/22/2014 08:03  Glucose-Capillary Latest Range: 70-99 mg/dL 70 90 92 57 (L) 657117 (H) 71   Note: Levemir order was decreased to 45 units yesterday evening but Levemir was NOT GIVEN last night due to lows during the day yesterday. Fasting glucose was 71 mg/dl this morning.  Patient received Levemir 55 units on 2/16 at 9:38 am and again at 21:29 pm. No basal insulin was given at all on 2/17. Unclear why or how insulin needs have changed so significantly over the past 24 hours.   At this point, recommend checking the glucose every 4 hours and using Novolog correction scale Q4H if needed. Will continue to follow and make recommendations as more data is collected.  MD, may want to consider ordering an Endocrinologist consult for inpatient recommendations to glycemic control.  Thanks, Orlando PennerMarie Armand Preast, RN, MSN, CCRN, CDE Diabetes Coordinator Inpatient Diabetes Program [phone] (Team Pager) [phone] (AP office) [phone] [facility] office)

## 2014-06-22 NOTE — Care Management Note (Signed)
    Page 1 of 2   07/06/2014     3:59:08 PM CARE MANAGEMENT NOTE 07/06/2014  Patient:  Eric Shaw,Eric Shaw   Account Number:  [id number]  Date Initiated:  06/22/2014  Documentation initiated by:  [name],[name]  Subjective/Objective Assessment:   dx cellulitis on back of neck  admit- from home.     Action/Plan:   Anticipated DC Date:  07/06/2014   Anticipated DC Plan:  HOME W HOME HEALTH SERVICES      DC Planning Services  CM consult  Follow-up appt scheduled  Medication Assistance      [facility]   Choice offered to / List presented to:  C-1 Patient   DME arranged  VAC      DME agency  [facility]     HH arranged  HH-1 RN      [facility]   Status of service:  Completed, signed off Medicare Important Message given?  NO (If response is "NO", the following Medicare IM given date fields will be blank) Date Medicare IM given:   Medicare IM given by:   Date Additional Medicare IM given:   Additional Medicare IM given by:    Discharge Disposition:  HOME W HOME HEALTH SERVICES  Per UR Regulation:  Reviewed for med. necessity/level of care/duration of stay  If discussed at Long Length of Stay Meetings, dates discussed:   06/27/2014    Comments:  Contact: [name] - friend - decision maker [phone] or [phone]  07/06/14 1556 [name] RN, BSN [phone] [facility], patient had wound vac exchange on Wed, patient dc home with wound vac with HHRN today.  NCM gave pateint information on [facility] clinic for medications and follow up appt.  [name], [facility] , [name] notified. Soc will begin 24-48 hrs post dc.  07/03/14 1209 [name] RN, BSN [phone] [id number] patient has a wound  vac on neck, NCM faxed Medicaid form and script for wound vac  from [facility] to [name]'s office to get sign and should be faxed back to NCM if the plan is to go home with a wound vac.  06-29-14  11am [name], [phone] 3rd surgery and extubated on wed, 2-24.  Today awake and alert. Lives in apt - [address].  No family - has friends. Has tried for medicaid/disability.  Called financial counselor - have seen once but Geri plans to see again tomorrow.  Interested in community assistance programs.  SW notified for assistance.  Consult placed.  06-26-14 9:50am [name], [phone] Post op necretizing fascitis - remains on vent -  Canceled 2-25 appt - will need to reschedule when gets closer to discharge.  Back to surgery today.   Per report - no family.  Contact and decision maker is [name] - friend [phone] or [phone].  Also another friend listed is [name] 301 [phone].  06/22/14 1446 [name] RN, BSN [phone] NCM [facility]/25 ta 11 am, patient will need med ast at dc.

## 2014-06-23 ENCOUNTER — Inpatient Hospital Stay (HOSPITAL_COMMUNITY): Payer: Self-pay

## 2014-06-23 ENCOUNTER — Encounter (HOSPITAL_COMMUNITY): Payer: Self-pay | Admitting: Radiology

## 2014-06-23 ENCOUNTER — Ambulatory Visit: Payer: Self-pay | Admitting: Otolaryngology

## 2014-06-23 DIAGNOSIS — L03221 Cellulitis of neck: Secondary | ICD-10-CM | POA: Diagnosis not present

## 2014-06-23 DIAGNOSIS — E871 Hypo-osmolality and hyponatremia: Secondary | ICD-10-CM | POA: Diagnosis not present

## 2014-06-23 DIAGNOSIS — F328 Other depressive episodes: Secondary | ICD-10-CM | POA: Diagnosis not present

## 2014-06-23 DIAGNOSIS — E1142 Type 2 diabetes mellitus with diabetic polyneuropathy: Secondary | ICD-10-CM | POA: Diagnosis not present

## 2014-06-23 LAB — BASIC METABOLIC PANEL
Anion gap: 7 (ref 5–15)
BUN: <5 mg/dL — ABNORMAL LOW (ref 6–23)
CALCIUM: 7.8 mg/dL — AB (ref 8.4–10.5)
CO2: 25 mmol/L (ref 19–32)
CREATININE: 0.56 mg/dL (ref 0.50–1.35)
Chloride: 99 mmol/L (ref 96–112)
GFR calc non Af Amer: >90 mL/min (ref >90)
GLUCOSE: 171 mg/dL — AB (ref 70–99)
Potassium: 3.5 mmol/L (ref 3.5–5.1)
Sodium: 131 mmol/L — ABNORMAL LOW (ref 135–145)

## 2014-06-23 LAB — CBC
HCT: 36.1 % — ABNORMAL LOW (ref 39.0–52.0)
Hemoglobin: 12.7 g/dL — ABNORMAL LOW (ref 13.0–17.0)
MCH: 30.7 pg (ref 26.0–34.0)
MCHC: 35.2 g/dL (ref 30.0–36.0)
MCV: 87.2 fL (ref 78.0–100.0)
Platelets: 355 10*3/uL (ref 150–400)
RBC: 4.14 MIL/uL — AB (ref 4.22–5.81)
RDW: 13.6 % (ref 11.5–15.5)
WBC: 18.2 10*3/uL — ABNORMAL HIGH (ref 4.0–10.5)

## 2014-06-23 LAB — GLUCOSE, CAPILLARY
GLUCOSE-CAPILLARY: 172 mg/dL — AB (ref 70–99)
GLUCOSE-CAPILLARY: 155 mg/dL — AB (ref 70–99)
Glucose-Capillary: 147 mg/dL — ABNORMAL HIGH (ref 70–99)
Glucose-Capillary: 211 mg/dL — ABNORMAL HIGH (ref 70–99)
Glucose-Capillary: 255 mg/dL — ABNORMAL HIGH (ref 70–99)

## 2014-06-23 MED ORDER — SODIUM CHLORIDE 0.9 % IV SOLN
INTRAVENOUS | Status: DC
  Administered 2014-06-23: 75 mL/h via INTRAVENOUS
  Administered 2014-06-24: 02:00:00 via INTRAVENOUS
  Administered 2014-06-27: 10 mL/h via INTRAVENOUS
  Administered 2014-06-30: 22:00:00 via INTRAVENOUS

## 2014-06-23 MED ORDER — INSULIN GLARGINE 100 UNIT/ML ~~LOC~~ SOLN
70.0000 [IU] | Freq: Every day | SUBCUTANEOUS | Status: DC
  Administered 2014-06-23: 70 [IU] via SUBCUTANEOUS
  Filled 2014-06-23 (×2): qty 0.7

## 2014-06-23 MED ORDER — POTASSIUM CHLORIDE CRYS ER 20 MEQ PO TBCR
40.0000 meq | EXTENDED_RELEASE_TABLET | Freq: Once | ORAL | Status: AC
  Administered 2014-06-23: 40 meq via ORAL
  Filled 2014-06-23: qty 2

## 2014-06-23 MED ORDER — INSULIN ASPART 100 UNIT/ML ~~LOC~~ SOLN
0.0000 [IU] | Freq: Three times a day (TID) | SUBCUTANEOUS | Status: DC
  Administered 2014-06-23: 4 [IU] via SUBCUTANEOUS
  Administered 2014-06-23: 7 [IU] via SUBCUTANEOUS

## 2014-06-23 MED ORDER — IOHEXOL 300 MG/ML  SOLN
75.0000 mL | Freq: Once | INTRAMUSCULAR | Status: AC | PRN
  Administered 2014-06-23: 75 mL via INTRAVENOUS

## 2014-06-23 NOTE — Consult Note (Addendum)
WOC wound consult note Reason for Consult: Consult requested today for posterior neck wounds.  Pt was admitted on 2/15; wounds have remained open to air; no topical treatments were ordered prior to this time.  Wound type: Multiple areas of full thickness skin loss in a scattered area approx 10X10cm to the back of patient's neck.  Measurement: Open areas are approx .2X.2cm with yellow slough exposed, small amt yellow drainage Wound ZOX:WRUEAVbed:Patchy areas of tightly adhered eschar surrounding open wounds, affected area approx 3X3cm Drainage (amount, consistency, odor) Small amt yellow drainage, no odor Periwound: Generalized edema surrounding wounds, extends from posterior neck to just before ears on both sides.  Very tight and painful to touch, erythremia is generalized. There has been no improvement in wound appearance since admission photo in the EMR. Dressing procedure/placement/frequency: Discussed plan of care with primary team; they have ordered a repeat CT scan which is pending. Recommend surgical consult; this wound is beyond WOC scope of practice; pt may need an I&D performed R/T persistent edema and pain.  He could benefit from chemical debridement if sharp debridement is not appropriate. Please re-consult if further assistance is needed.  Thank-you,  Cammie Mcgeeawn Shermon Bozzi MSN, RN, CWOCN, [place]CN-[place], CNS [phone]

## 2014-06-23 NOTE — Progress Notes (Signed)
[facility] PROGRESS NOTE  Pierce CraneJon Amstutz UEA:[id number] DOB: 09-25-1960 DOA: 06/19/2014 PCP: No PCP Per Patient  Summary I have seen and examined Mr Eric Shaw at bedside and reviewed his chart. Pierce CraneJon Utke is a pleasant 54 year old male with known type 2 diabetes mellitus, last known A1c in August 2014 was 9.8, depression, suicidal ideations in the past and requiring behavioral health hospitalization, morbid obesity, hypertension, who [facility] with main concern of progressively worsening neck pain, swelling, redness, tenderness to touch for the last 2-1/2 weeks and he was found to have severe cellulitis with sepsis, white count greater than 33,000. CT of there neck showed "1. CT findings are most consistent with severe cellulitis involving the posterior soft tissues of the occiput and upper cervical spine. Edema extends into the trapezius and semispinalis capitis musculature. However, there is no focal fluid collection to suggest abscess. 2. Mild atherosclerotic calcifications in the bilateral proximal internal carotid arteries. 3. Focal degenerative disc disease with mild central stenosis at C7-T1". Blood culture/wound culture grew staph aureus. Sensitivity pending. Given the degree of cellulitis, will keep on triple antibiotics pending final culture results. White count has improved slightly to 29,000. He reports feeling better. Appreciate diabetes coordinator.  Plan  MSSA cellulitis, involving the back of the neck  Presented with induration, swelling and weeping serous fluids in the back of the neck.  Was on vancomycin/Zosyn/ciprofloxacin. Grew MSSA, cefazolin is recommended.  Started on 2 g of cefazolin every 8 hours.  Leukocytosis improving, still has significant induration, repeat CT scan of the neck to rule out abscess formation.  Hyponatremia  Expect continued improvement with treatment of infection and hydration.  Slowly improving,  continue IV fluids, check sodium in a.m.  DM2 (diabetes mellitus, type 2) uncontrolled  Hemoglobin A1c is 11.5 correlate with a glucose of 289.  Received 50 units last night, fasting blood glucose this morning is 170.  I will increase Lantus to 70 units at night, it can be divided to 2 doses on discharge (BID).  Diabetic neuropathy/MDD (major depressive disorder)  No acute changes  Code Status: Full code Family Communication: No family at bedside Disposition Plan: Eventually home   Consultants:  None  Procedures:  None  Antibiotics:  Vancomycin 06/19/2014>  Zosyn 06/19/2014>  Ciprofloxacin 06/20/2014>  HPI/Subjective: Feels a little bit better. His appetite still poor.  Objective: Filed Vitals:   06/23/14 0922  BP: 140/69  Pulse: 84  Temp:   Resp:     Intake/Output Summary (Last 24 hours) at 06/23/14 1053 Last data filed at 06/23/14 0853  Gross per 24 hour  Intake 2374.33 ml  Output   3000 ml  Net -625.67 ml   Filed Weights   06/19/14 1214 06/19/14 1827  Weight: 106.595 kg (235 lb) 106.595 kg (235 lb)    Exam:   General:  Comfortable at rest. Cellulitis of the neck with mild drainage.  Cardiovascular: S1-S2 normal. No murmurs. Pulse regular.  Respiratory: Good air entry bilaterally. No rhonchi or rales.  Abdomen: Soft and nontender. Normal bowel sounds. No organomegaly.  Musculoskeletal: No pedal edema   Neurological: Intact  Data Reviewed: Basic Metabolic Panel:  Recent Labs Lab 06/19/14 1311 06/20/14 0540 06/21/14 0753 06/22/14 0831 06/23/14 0755  NA 121* 127* 129* 130* 131*  K 4.8 4.2 3.9 3.7 3.5  CL 86* 96 97 100 99  CO2 22 17* 27 25 25   GLUCOSE 420* 211* 78 77 171*  BUN 19 17 16 8  <5*  CREATININE 1.15 0.80  0.76 0.61 0.56  CALCIUM 8.5 8.2* 8.0* 7.8* 7.8*  MG  --   --  2.3  --   --   PHOS  --   --  3.1  --   --    Liver Function Tests:  Recent Labs Lab 06/19/14 1311 06/21/14 0753  AST 28 30  ALT 23 19  ALKPHOS  178* 134*  BILITOT 1.1 1.1  PROT 7.2 6.4  ALBUMIN 1.8* 1.7*   No results for input(s): LIPASE, AMYLASE in the last 168 hours. No results for input(s): AMMONIA in the last 168 hours. CBC:  Recent Labs Lab 06/19/14 1311 06/20/14 0540 06/21/14 0753 06/22/14 0831 06/23/14 0755  WBC 33.8* 29.6* 29.2* 21.6* 18.2*  NEUTROABS 30.4*  --   --   --   --   HGB 14.9 14.6 13.8 12.5* 12.7*  HCT 41.2 41.2 39.0 36.4* 36.1*  MCV 86.2 86.9 89.9 87.5 87.2  PLT 328 302 279 305 355   Cardiac Enzymes: No results for input(s): CKTOTAL, CKMB, CKMBINDEX, TROPONINI in the last 168 hours. BNP (last 3 results) No results for input(s): BNP in the last 8760 hours.  ProBNP (last 3 results) No results for input(s): PROBNP in the last 8760 hours.  CBG:  Recent Labs Lab 06/22/14 1251 06/22/14 1749 06/22/14 2231 06/23/14 0010 06/23/14 0809  GLUCAP 134* 166* 227* 255* 172*    Recent Results (from the past 240 hour(s))  Wound culture     Status: None   Collection Time: 06/19/14  5:05 PM  Result Value Ref Range Status   Specimen Description WOUND NECK  Final   Special Requests Immunocompromised  Final   Gram Stain   Final    RARE WBC PRESENT, PREDOMINANTLY PMN NO SQUAMOUS EPITHELIAL CELLS SEEN MODERATE GRAM POSITIVE COCCI IN PAIRS Performed at [facility]    Culture   Final    ABUNDANT STAPHYLOCOCCUS AUREUS Note: RIFAMPIN AND GENTAMICIN SHOULD NOT BE USED AS SINGLE DRUGS FOR TREATMENT OF STAPH INFECTIONS. Performed at [facility]    Report Status 06/21/2014 FINAL  Final   Organism ID, Bacteria STAPHYLOCOCCUS AUREUS  Final      Susceptibility   Staphylococcus aureus - MIC*    CLINDAMYCIN <=0.25 SENSITIVE Sensitive     ERYTHROMYCIN <=0.25 SENSITIVE Sensitive     GENTAMICIN <=0.5 SENSITIVE Sensitive     LEVOFLOXACIN 0.25 SENSITIVE Sensitive     OXACILLIN 0.5 SENSITIVE Sensitive     PENICILLIN >=0.5 RESISTANT Resistant     RIFAMPIN <=0.5 SENSITIVE Sensitive      TRIMETH/SULFA <=10 SENSITIVE Sensitive     VANCOMYCIN <=0.5 SENSITIVE Sensitive     TETRACYCLINE <=1 SENSITIVE Sensitive     MOXIFLOXACIN <=0.25 SENSITIVE Sensitive     * ABUNDANT STAPHYLOCOCCUS AUREUS  Culture, blood (routine x 2)     Status: None (Preliminary result)   Collection Time: 06/19/14  6:46 PM  Result Value Ref Range Status   Specimen Description BLOOD LEFT ARM  Final   Special Requests BOTTLES DRAWN AEROBIC AND ANAEROBIC 6CC  Final   Culture   Final           BLOOD CULTURE RECEIVED NO GROWTH TO DATE CULTURE WILL BE HELD FOR 5 DAYS BEFORE ISSUING A FINAL NEGATIVE REPORT Performed at [facility]    Report Status PENDING  Incomplete  Culture, blood (routine x 2)     Status: None (Preliminary result)   Collection Time: 06/19/14  7:02 PM  Result Value Ref Range Status  Specimen Description BLOOD LEFT HAND  Final   Special Requests   Final    BOTTLES DRAWN AEROBIC AND ANAEROBIC 5CC BLUE 4CC PURPLE   Culture   Final           BLOOD CULTURE RECEIVED NO GROWTH TO DATE CULTURE WILL BE HELD FOR 5 DAYS BEFORE ISSUING A FINAL NEGATIVE REPORT Performed at [facility]    Report Status PENDING  Incomplete     Studies: No results found.  Scheduled Meds: . ARIPiprazole  5 mg Oral q morning - 10a  . atenolol  50 mg Oral Daily  .  ceFAZolin (ANCEF) IV  2 g Intravenous 3 times per day  . DULoxetine  90 mg Oral QPM  . enoxaparin (LOVENOX) injection  40 mg Subcutaneous Q24H  . gabapentin  600 mg Oral TID  . insulin aspart  0-9 Units Subcutaneous TID WC  . insulin glargine  50 Units Subcutaneous QHS  . lisinopril  10 mg Oral Daily   Continuous Infusions: . sodium chloride 100 mL/hr at 06/22/14 1338     Time spent: 25 minutes    [facility] A  [facility] Pager [phone]. If 7PM-7AM, please contact night-coverage at [email or web address], password [facility] 06/23/2014, 10:53 AM  LOS: 4 days

## 2014-06-23 NOTE — Progress Notes (Signed)
Inpatient Diabetes Program Recommendations  AACE/ADA: New Consensus Statement on Inpatient Glycemic Control (2013)  Target Ranges:  Prepandial:   less than 140 mg/dL      Peak postprandial:   less than 180 mg/dL (1-2 hours)      Critically ill patients:  140 - 180 mg/dL   Results for Eric Shaw, Eric Shaw (MRN [id number]) as of 06/23/2014 11:28  Ref. Range 06/22/2014 08:03 06/22/2014 12:51 06/22/2014 17:49 06/22/2014 22:31 06/23/2014 00:10 06/23/2014 08:09  Glucose-Capillary Latest Range: 70-99 mg/dL 71 956134 (H) 213166 (H) 086227 (H) 255 (H) 172 (H)   Current orders for Inpatient glycemic control: Lantus 70 units QHS (divide into 2 syringes), Novolog 0-20 units TID with meals  Inpatient Diabetes Program Recommendations Insulin - Basal: Noted patient received Lantus 50 units last night and fasting glucose is 172 mg/dl this morning. Lantus order was increased to Lantus 70 units QHS (divided into 2 syringes). Concerned about how much Lantus was increased this morning; will page MD regarding increase. The cost of Lantus is over $250 per vial and over $400 per box of pens. MD, please remember that patient has no insurance and will need to be discharged on 70/30 insulin. Therefore will need to transition from Lantus to equivalent dose of 70/30.   Thanks, Orlando PennerMarie Chou Busler, RN, MSN, CCRN, CDE Diabetes Coordinator Inpatient Diabetes Program [phone] (Team Pager) [phone] (AP office) [phone] [facility] office)

## 2014-06-23 NOTE — Consult Note (Signed)
Reason for Consult: Neck abscess Referring Physician: Verlee Monte, MD  Eric Shaw is an 54 y.o. male.  HPI: 3 week history of os tear neck pain and swelling. While in the hospital, it got a little bit better but then started getting worse again. No history of MRSA that he knows of. Long history of diabetes.  Past Medical History  Diagnosis Date  . Plantar fasciitis, bilateral   . Hypertension   . High cholesterol   . Type II diabetes mellitus   . Sleep apnea     "not suppose to wear mask" (06/19/2014)  . Arthritis     "all 10 toes" (06/19/2014)  . Depression     Past Surgical History  Procedure Laterality Date  . Reduction of torsion of testis Left 1979    History reviewed. No pertinent family history.  Social History:  reports that he has been smoking Cigarettes.  He has a 36 pack-year smoking history. He has never used smokeless tobacco. He reports that he drinks about 1.2 oz of alcohol per week. He reports that he does not use illicit drugs.  Allergies: No Known Allergies  Medications: Reviewed  Results for orders placed or performed during the hospital encounter of 06/19/14 (from the past 48 hour(s))  Glucose, capillary     Status: Abnormal   Collection Time: 06/21/14  5:51 PM  Result Value Ref Range   Glucose-Capillary 57 (L) 70 - 99 mg/dL  Glucose, capillary     Status: Abnormal   Collection Time: 06/21/14 10:18 PM  Result Value Ref Range   Glucose-Capillary 117 (H) 70 - 99 mg/dL   Comment 1 Notify RN    Comment 2 Documented in Char   Glucose, capillary     Status: None   Collection Time: 06/22/14  8:03 AM  Result Value Ref Range   Glucose-Capillary 71 70 - 99 mg/dL  Basic metabolic panel     Status: Abnormal   Collection Time: 06/22/14  8:31 AM  Result Value Ref Range   Sodium 130 (L) 135 - 145 mmol/L   Potassium 3.7 3.5 - 5.1 mmol/L   Chloride 100 96 - 112 mmol/L   CO2 25 19 - 32 mmol/L   Glucose, Bld 77 70 - 99 mg/dL   BUN 8 6 - 23 mg/dL    Creatinine, Ser 0.61 0.50 - 1.35 mg/dL   Calcium 7.8 (L) 8.4 - 10.5 mg/dL   GFR calc non Af Amer >90 >90 mL/min   GFR calc Af Amer >90 >90 mL/min    Comment: (NOTE) The eGFR has been calculated using the CKD EPI equation. This calculation has not been validated in all clinical situations. eGFR's persistently <90 mL/min signify possible Chronic Kidney Disease.    Anion gap 5 5 - 15  CBC     Status: Abnormal   Collection Time: 06/22/14  8:31 AM  Result Value Ref Range   WBC 21.6 (H) 4.0 - 10.5 K/uL   RBC 4.16 (L) 4.22 - 5.81 MIL/uL   Hemoglobin 12.5 (L) 13.0 - 17.0 g/dL   HCT 36.4 (L) 39.0 - 52.0 %   MCV 87.5 78.0 - 100.0 fL   MCH 30.0 26.0 - 34.0 pg   MCHC 34.3 30.0 - 36.0 g/dL   RDW 13.5 11.5 - 15.5 %   Platelets 305 150 - 400 K/uL  Glucose, capillary     Status: Abnormal   Collection Time: 06/22/14 12:51 PM  Result Value Ref Range   Glucose-Capillary 134 (H) 70 -  99 mg/dL  Glucose, capillary     Status: Abnormal   Collection Time: 06/22/14  5:49 PM  Result Value Ref Range   Glucose-Capillary 166 (H) 70 - 99 mg/dL  Glucose, capillary     Status: Abnormal   Collection Time: 06/22/14 10:31 PM  Result Value Ref Range   Glucose-Capillary 227 (H) 70 - 99 mg/dL   Comment 1 Notify RN    Comment 2 Documented in Char   Glucose, capillary     Status: Abnormal   Collection Time: 06/23/14 12:10 AM  Result Value Ref Range   Glucose-Capillary 255 (H) 70 - 99 mg/dL  Basic metabolic panel     Status: Abnormal   Collection Time: 06/23/14  7:55 AM  Result Value Ref Range   Sodium 131 (L) 135 - 145 mmol/L   Potassium 3.5 3.5 - 5.1 mmol/L   Chloride 99 96 - 112 mmol/L   CO2 25 19 - 32 mmol/L   Glucose, Bld 171 (H) 70 - 99 mg/dL   BUN <5 (L) 6 - 23 mg/dL   Creatinine, Ser 0.56 0.50 - 1.35 mg/dL   Calcium 7.8 (L) 8.4 - 10.5 mg/dL   GFR calc non Af Amer >90 >90 mL/min   GFR calc Af Amer >90 >90 mL/min    Comment: (NOTE) The eGFR has been calculated using the CKD EPI equation. This  calculation has not been validated in all clinical situations. eGFR's persistently <90 mL/min signify possible Chronic Kidney Disease.    Anion gap 7 5 - 15  CBC     Status: Abnormal   Collection Time: 06/23/14  7:55 AM  Result Value Ref Range   WBC 18.2 (H) 4.0 - 10.5 K/uL   RBC 4.14 (L) 4.22 - 5.81 MIL/uL   Hemoglobin 12.7 (L) 13.0 - 17.0 g/dL   HCT 36.1 (L) 39.0 - 52.0 %   MCV 87.2 78.0 - 100.0 fL   MCH 30.7 26.0 - 34.0 pg   MCHC 35.2 30.0 - 36.0 g/dL   RDW 13.6 11.5 - 15.5 %   Platelets 355 150 - 400 K/uL  Glucose, capillary     Status: Abnormal   Collection Time: 06/23/14  8:09 AM  Result Value Ref Range   Glucose-Capillary 172 (H) 70 - 99 mg/dL  Glucose, capillary     Status: Abnormal   Collection Time: 06/23/14 12:09 PM  Result Value Ref Range   Glucose-Capillary 211 (H) 70 - 99 mg/dL    Ct Soft Tissue Neck W Contrast  06/23/2014   CLINICAL DATA:  Ingrown hair on back of neck which became infected. Yellowish drainage from the wound.  EXAM: CT NECK WITH CONTRAST  TECHNIQUE: Multidetector CT imaging of the neck was performed using the standard protocol following the bolus administration of intravenous contrast.  CONTRAST:  63m OMNIPAQUE IOHEXOL 300 MG/ML  SOLN  COMPARISON:  06/19/2014  FINDINGS: Progressive marked skin thickening and subcutaneous reticulation/expansion throughout the posterior neck and occipital scalp, consistent with severe cellulitis. As before, there is expansion and edema in the bilateral upper trapezius consistent with myositis. There is also extensive inflammation deep to the trapezius, with thin fluid collections in the left more than right posterior cervical space fat. Below the deep fascia, in the paraspinal compartment, is increasingly discrete fluid between the left semispinalis capitis and splenius capitis, measuring 12 x 35 x 60 mm. This fluid has rim enhancement and mass effect and is most consistent with combination of abscess and phlegmon. There is  mild  retropharyngeal edema which is not progressed. No retropharyngeal abscess or phlegmon.  No evidence of intrathoracic or spinal infection.  Pharynx and larynx: Normal  Salivary glands: Normal  Thyroid: Normal  Lymph nodes: No separate adenopathy.  Vascular: Cervical carotid atherosclerosis. No major vessel occlusion.  Limited intracranial: Negative  Visualized orbits: Negative  Mastoids and visualized paranasal sinuses: Clear  Skeleton: Congenitally narrow spinal canal with superimposed lower cervical degenerative disc disease. No evidence of osseous infection.  Upper chest: No evidence of pneumonia.  IMPRESSION: 1. Progressive posterior cervical/occipital cellulitis and myositis. There is a mix of phlegmon and abscess in the bilateral posterior cervical space and left paraspinal compartment, as above. 2. Stable mild retropharyngeal edema.   Electronically Signed   By: Monte Fantasia M.D.   On: 06/23/2014 12:36    XOV:ANVBTYOM except as listed in admit H&P  Blood pressure 163/84, pulse 81, temperature 98.6 F (37 C), temperature source Oral, resp. rate 15, height _0  (1.778 m), weight 106.595 kg (235 lb), SpO2 92 %.  PHYSICAL EXAM: Overall appearance:  Not acutely toxic. Head:  Normocephalic, atraumatic. Ears: External ears look normal. Nose: External nose is healthy in appearance. Internal nasal exam free of any lesions or obstruction. Oral Cavity:  There are no mucosal lesions or masses identified. Neuro:  No identifiable neurologic deficits. Neck:  Massive swelling of the posterior neck with some desquamation, pustules and severe induration. He has basically no range of motion because of the swelling and tenderness.  Studies Reviewed: neck CT   Assessment/Plan: Plan incision and drainage tomorrow morning. Risks and benefits were discussed. All questions were answered.  Benedicta Sultan 06/23/2014, 4:33 PM

## 2014-06-23 NOTE — Significant Event (Signed)
CT scan of the neck came back positive for abscess/phlegmon. Called Gen. surgery initially and they recommend ENT consultation. Consulted Dr. Pollyann Kennedyosen for further surgical evaluation. Please note the comparison between admission and today's pictures as well as CT scan.

## 2014-06-24 ENCOUNTER — Inpatient Hospital Stay (HOSPITAL_COMMUNITY): Payer: MEDICAID

## 2014-06-24 ENCOUNTER — Encounter (HOSPITAL_COMMUNITY)
Admission: EM | Disposition: A | Discharge status: Home Health | Payer: Self-pay | Source: Home / Self Care | Attending: Internal Medicine

## 2014-06-24 ENCOUNTER — Inpatient Hospital Stay (HOSPITAL_COMMUNITY): Payer: MEDICAID | Admitting: Certified Registered Nurse Anesthetist

## 2014-06-24 ENCOUNTER — Inpatient Hospital Stay (HOSPITAL_COMMUNITY): Payer: Self-pay

## 2014-06-24 ENCOUNTER — Inpatient Hospital Stay (HOSPITAL_COMMUNITY): Payer: Self-pay | Admitting: Certified Registered Nurse Anesthetist

## 2014-06-24 DIAGNOSIS — A419 Sepsis, unspecified organism: Secondary | ICD-10-CM | POA: Diagnosis not present

## 2014-06-24 DIAGNOSIS — E1151 Type 2 diabetes mellitus with diabetic peripheral angiopathy without gangrene: Secondary | ICD-10-CM | POA: Diagnosis not present

## 2014-06-24 DIAGNOSIS — E872 Acidosis: Secondary | ICD-10-CM | POA: Diagnosis not present

## 2014-06-24 DIAGNOSIS — J9602 Acute respiratory failure with hypercapnia: Secondary | ICD-10-CM | POA: Insufficient documentation

## 2014-06-24 DIAGNOSIS — E1349 Other specified diabetes mellitus with other diabetic neurological complication: Secondary | ICD-10-CM | POA: Diagnosis not present

## 2014-06-24 DIAGNOSIS — E1129 Type 2 diabetes mellitus with other diabetic kidney complication: Secondary | ICD-10-CM | POA: Diagnosis not present

## 2014-06-24 DIAGNOSIS — L0390 Cellulitis, unspecified: Secondary | ICD-10-CM | POA: Diagnosis not present

## 2014-06-24 DIAGNOSIS — L0211 Cutaneous abscess of neck: Secondary | ICD-10-CM | POA: Diagnosis not present

## 2014-06-24 DIAGNOSIS — E871 Hypo-osmolality and hyponatremia: Secondary | ICD-10-CM | POA: Diagnosis not present

## 2014-06-24 HISTORY — PX: WOUND DEBRIDEMENT: SHX247

## 2014-06-24 HISTORY — PX: INCISION AND DRAINAGE ABSCESS: SHX5864

## 2014-06-24 LAB — GLUCOSE, CAPILLARY
GLUCOSE-CAPILLARY: 152 mg/dL — AB (ref 70–99)
Glucose-Capillary: 126 mg/dL — ABNORMAL HIGH (ref 70–99)
Glucose-Capillary: 118 mg/dL — ABNORMAL HIGH (ref 70–99)
Glucose-Capillary: 119 mg/dL — ABNORMAL HIGH (ref 70–99)
Glucose-Capillary: 161 mg/dL — ABNORMAL HIGH (ref 70–99)

## 2014-06-24 LAB — BASIC METABOLIC PANEL
Anion gap: 5 (ref 5–15)
BUN: <5 mg/dL — ABNORMAL LOW (ref 6–23)
CO2: 31 mmol/L (ref 19–32)
Calcium: 8.4 mg/dL (ref 8.4–10.5)
Chloride: 99 mmol/L (ref 96–112)
Creatinine, Ser: 0.67 mg/dL (ref 0.50–1.35)
GFR calc Af Amer: >90 mL/min (ref >90)
GFR calc non Af Amer: >90 mL/min (ref >90)
GLUCOSE: 117 mg/dL — AB (ref 70–99)
Potassium: 4.1 mmol/L (ref 3.5–5.1)
Sodium: 135 mmol/L (ref 135–145)

## 2014-06-24 LAB — MAGNESIUM: MAGNESIUM: 1.7 mg/dL (ref 1.5–2.5)

## 2014-06-24 LAB — CBC
HEMATOCRIT: 39.1 % (ref 39.0–52.0)
Hemoglobin: 13.5 g/dL (ref 13.0–17.0)
MCH: 30.6 pg (ref 26.0–34.0)
MCHC: 34.5 g/dL (ref 30.0–36.0)
MCV: 88.7 fL (ref 78.0–100.0)
PLATELETS: 396 10*3/uL (ref 150–400)
RBC: 4.41 MIL/uL (ref 4.22–5.81)
RDW: 13.7 % (ref 11.5–15.5)
WBC: 16.8 10*3/uL — ABNORMAL HIGH (ref 4.0–10.5)

## 2014-06-24 LAB — URINALYSIS, ROUTINE W REFLEX MICROSCOPIC
Bilirubin Urine: NEGATIVE
GLUCOSE, UA: NEGATIVE mg/dL
Hgb urine dipstick: NEGATIVE
Ketones, ur: NEGATIVE mg/dL
LEUKOCYTES UA: NEGATIVE
NITRITE: NEGATIVE
PH: 7 (ref 5.0–8.0)
PROTEIN: NEGATIVE mg/dL
Specific Gravity, Urine: 1.013 (ref 1.005–1.030)
Urobilinogen, UA: 2 mg/dL — ABNORMAL HIGH (ref 0.0–1.0)

## 2014-06-24 LAB — SURGICAL PCR SCREEN
MRSA, PCR: NEGATIVE
Staphylococcus aureus: POSITIVE — AB

## 2014-06-24 LAB — LACTIC ACID, PLASMA: Lactic Acid, Venous: 1.6 mmol/L (ref 0.5–2.0)

## 2014-06-24 LAB — POCT I-STAT 3, ART BLOOD GAS (G3+)
ACID-BASE EXCESS: 1 mmol/L (ref 0.0–2.0)
Bicarbonate: 26.4 mEq/L — ABNORMAL HIGH (ref 20.0–24.0)
O2 SAT: 99 %
PCO2 ART: 41.2 mmHg (ref 35.0–45.0)
PO2 ART: 118 mmHg — AB (ref 80.0–100.0)
Patient temperature: 98
TCO2: 28 mmol/L (ref 0–100)
pH, Arterial: 7.413 (ref 7.350–7.450)

## 2014-06-24 LAB — PHOSPHORUS: PHOSPHORUS: 2.8 mg/dL (ref 2.3–4.6)

## 2014-06-24 LAB — MRSA PCR SCREENING: MRSA BY PCR: NEGATIVE

## 2014-06-24 SURGERY — INCISION AND DRAINAGE, ABSCESS
Anesthesia: General | Site: Neck

## 2014-06-24 MED ORDER — ROCURONIUM BROMIDE 100 MG/10ML IV SOLN
INTRAVENOUS | Status: DC | PRN
  Administered 2014-06-24 (×2): 30 mg via INTRAVENOUS

## 2014-06-24 MED ORDER — FENTANYL CITRATE 0.05 MG/ML IJ SOLN: 25.0000 ug | INTRAMUSCULAR | Status: DC | PRN

## 2014-06-24 MED ORDER — BACITRACIN ZINC 500 UNIT/GM EX OINT
TOPICAL_OINTMENT | CUTANEOUS | Status: AC
  Filled 2014-06-24: qty 28.35

## 2014-06-24 MED ORDER — PANTOPRAZOLE SODIUM 40 MG IV SOLR
40.0000 mg | Freq: Every day | INTRAVENOUS | Status: DC
Start: 2014-06-24 — End: 2014-06-25
  Administered 2014-06-24 – 2014-06-25 (×2): 40 mg via INTRAVENOUS
  Filled 2014-06-24 (×2): qty 40

## 2014-06-24 MED ORDER — ATENOLOL 50 MG PO TABS
50.0000 mg | ORAL_TABLET | Freq: Every day | ORAL | Status: DC
  Filled 2014-06-24: qty 1

## 2014-06-24 MED ORDER — FENTANYL CITRATE 0.05 MG/ML IJ SOLN
INTRAMUSCULAR | Status: AC
  Filled 2014-06-24: qty 5

## 2014-06-24 MED ORDER — LIDOCAINE HCL 4 % IJ SOLN
INTRAMUSCULAR | Status: AC
  Filled 2014-06-24: qty 10

## 2014-06-24 MED ORDER — GABAPENTIN 300 MG PO CAPS
600.0000 mg | ORAL_CAPSULE | Freq: Three times a day (TID) | ORAL | Status: DC
  Administered 2014-06-25: 600 mg
  Filled 2014-06-24 (×5): qty 2

## 2014-06-24 MED ORDER — MUPIROCIN 2 % EX OINT
1.0000 | TOPICAL_OINTMENT | Freq: Two times a day (BID) | CUTANEOUS | Status: DC
Start: 2014-06-24 — End: 2014-06-26
  Administered 2014-06-24 – 2014-06-26 (×5): 1 via NASAL
  Filled 2014-06-24 (×2): qty 22

## 2014-06-24 MED ORDER — SUCCINYLCHOLINE CHLORIDE 20 MG/ML IJ SOLN
INTRAMUSCULAR | Status: AC
  Filled 2014-06-24: qty 1

## 2014-06-24 MED ORDER — DEXMEDETOMIDINE HCL IN NACL 200 MCG/50ML IV SOLN
INTRAVENOUS | Status: AC
  Filled 2014-06-24: qty 50

## 2014-06-24 MED ORDER — CETYLPYRIDINIUM CHLORIDE 0.05 % MT LIQD
7.0000 mL | Freq: Four times a day (QID) | OROMUCOSAL | Status: DC
  Administered 2014-06-25 – 2014-06-29 (×18): 7 mL via OROMUCOSAL

## 2014-06-24 MED ORDER — MIDAZOLAM HCL 5 MG/5ML IJ SOLN
INTRAMUSCULAR | Status: DC | PRN
Start: 2014-06-24 — End: 2014-06-24
  Administered 2014-06-24: 2 mg via INTRAVENOUS

## 2014-06-24 MED ORDER — PROPOFOL INFUSION 10 MG/ML OPTIME
INTRAVENOUS | Status: DC | PRN
  Administered 2014-06-24: 25 ug/kg/min via INTRAVENOUS

## 2014-06-24 MED ORDER — LACTATED RINGERS IV SOLN
INTRAVENOUS | Status: DC
  Administered 2014-06-24: 11:00:00 via INTRAVENOUS

## 2014-06-24 MED ORDER — LIDOCAINE HCL (CARDIAC) 20 MG/ML IV SOLN
INTRAVENOUS | Status: DC | PRN
  Administered 2014-06-24: 60 mg via INTRAVENOUS

## 2014-06-24 MED ORDER — PROPOFOL 10 MG/ML IV EMUL
5.0000 ug/kg/min | INTRAVENOUS | Status: DC
  Administered 2014-06-24: 50 ug/kg/min via INTRAVENOUS
  Administered 2014-06-24: 40 ug/kg/min via INTRAVENOUS
  Administered 2014-06-25: 30 ug/kg/min via INTRAVENOUS
  Administered 2014-06-25: 40 ug/kg/min via INTRAVENOUS
  Administered 2014-06-25: 25 ug/kg/min via INTRAVENOUS
  Administered 2014-06-25: 40 ug/kg/min via INTRAVENOUS
  Administered 2014-06-26 – 2014-06-27 (×5): 30 ug/kg/min via INTRAVENOUS
  Administered 2014-06-27: 7 ug/kg/min via INTRAVENOUS
  Filled 2014-06-24 (×14): qty 100

## 2014-06-24 MED ORDER — FENTANYL CITRATE 0.05 MG/ML IJ SOLN
25.0000 ug/h | INTRAMUSCULAR | Status: DC
  Administered 2014-06-24 – 2014-06-27 (×3): 100 ug/h via INTRAVENOUS
  Administered 2014-06-27: 50 ug/h via INTRAVENOUS
  Filled 2014-06-24 (×4): qty 50

## 2014-06-24 MED ORDER — PRO-STAT SUGAR FREE PO LIQD
30.0000 mL | Freq: Two times a day (BID) | ORAL | Status: DC
  Administered 2014-06-25 (×3): 30 mL
  Filled 2014-06-24 (×5): qty 30

## 2014-06-24 MED ORDER — MIDAZOLAM HCL 2 MG/2ML IJ SOLN
INTRAMUSCULAR | Status: AC
  Filled 2014-06-24: qty 2

## 2014-06-24 MED ORDER — LIDOCAINE-EPINEPHRINE 1 %-1:100000 IJ SOLN
INTRAMUSCULAR | Status: AC
  Filled 2014-06-24: qty 1

## 2014-06-24 MED ORDER — INSULIN ASPART 100 UNIT/ML ~~LOC~~ SOLN
0.0000 [IU] | SUBCUTANEOUS | Status: DC
  Administered 2014-06-24 (×2): 4 [IU] via SUBCUTANEOUS
  Administered 2014-06-25: 3 [IU] via SUBCUTANEOUS
  Administered 2014-06-25 (×3): 4 [IU] via SUBCUTANEOUS
  Administered 2014-06-25 – 2014-06-27 (×4): 3 [IU] via SUBCUTANEOUS
  Administered 2014-06-27 (×2): 4 [IU] via SUBCUTANEOUS
  Administered 2014-06-27: 3 [IU] via SUBCUTANEOUS
  Administered 2014-06-28: 4 [IU] via SUBCUTANEOUS
  Administered 2014-06-28 (×2): 3 [IU] via SUBCUTANEOUS
  Administered 2014-06-28: 4 [IU] via SUBCUTANEOUS
  Administered 2014-06-28 – 2014-06-29 (×2): 3 [IU] via SUBCUTANEOUS
  Administered 2014-06-29 (×2): 4 [IU] via SUBCUTANEOUS
  Administered 2014-06-29: 3 [IU] via SUBCUTANEOUS
  Administered 2014-06-29 – 2014-06-30 (×4): 4 [IU] via SUBCUTANEOUS
  Administered 2014-06-30: 3 [IU] via SUBCUTANEOUS
  Administered 2014-06-30 – 2014-07-01 (×3): 4 [IU] via SUBCUTANEOUS
  Administered 2014-07-01: 7 [IU] via SUBCUTANEOUS

## 2014-06-24 MED ORDER — OXYCODONE HCL 5 MG PO TABS: 5.0000 mg | ORAL_TABLET | Freq: Once | ORAL | Status: DC | PRN

## 2014-06-24 MED ORDER — FENTANYL CITRATE 0.05 MG/ML IJ SOLN: 50.0000 ug | Freq: Once | INTRAMUSCULAR | Status: DC

## 2014-06-24 MED ORDER — ARIPIPRAZOLE 5 MG PO TABS
5.0000 mg | ORAL_TABLET | Freq: Every morning | ORAL | Status: DC
  Administered 2014-06-25 – 2014-06-29 (×4): 5 mg
  Filled 2014-06-24 (×7): qty 1

## 2014-06-24 MED ORDER — ROCURONIUM BROMIDE 50 MG/5ML IV SOLN
INTRAVENOUS | Status: AC
  Filled 2014-06-24: qty 1

## 2014-06-24 MED ORDER — METHYLPREDNISOLONE SODIUM SUCC 125 MG IJ SOLR
INTRAMUSCULAR | Status: DC | PRN
  Administered 2014-06-24: 125 mg via INTRAVENOUS

## 2014-06-24 MED ORDER — LIDOCAINE HCL (CARDIAC) 20 MG/ML IV SOLN
INTRAVENOUS | Status: AC
  Filled 2014-06-24: qty 5

## 2014-06-24 MED ORDER — VITAL HIGH PROTEIN PO LIQD
1000.0000 mL | ORAL | Status: DC
  Administered 2014-06-25 (×2): 1000 mL
  Filled 2014-06-24 (×4): qty 1000

## 2014-06-24 MED ORDER — LACTATED RINGERS IV SOLN
INTRAVENOUS | Status: DC | PRN
  Administered 2014-06-24 (×2): via INTRAVENOUS

## 2014-06-24 MED ORDER — FENTANYL BOLUS VIA INFUSION
50.0000 ug | INTRAVENOUS | Status: DC | PRN
  Filled 2014-06-24: qty 50

## 2014-06-24 MED ORDER — OXYCODONE HCL 5 MG/5ML PO SOLN: 5.0000 mg | Freq: Once | ORAL | Status: DC | PRN

## 2014-06-24 MED ORDER — PROPOFOL 10 MG/ML IV EMUL
INTRAVENOUS | Status: AC
  Filled 2014-06-24: qty 100

## 2014-06-24 MED ORDER — SUCCINYLCHOLINE CHLORIDE 20 MG/ML IJ SOLN
INTRAMUSCULAR | Status: DC | PRN
  Administered 2014-06-24: 20 mg via INTRAVENOUS
  Administered 2014-06-24: 150 mg via INTRAVENOUS

## 2014-06-24 MED ORDER — CHLORHEXIDINE GLUCONATE CLOTH 2 % EX PADS
6.0000 | MEDICATED_PAD | Freq: Every day | CUTANEOUS | Status: DC
  Administered 2014-06-24 – 2014-06-26 (×3): 6 via TOPICAL

## 2014-06-24 MED ORDER — DEXMEDETOMIDINE BOLUS VIA INFUSION
INTRAVENOUS | Status: DC | PRN
  Administered 2014-06-24 (×4): 4 ug via INTRAVENOUS
  Administered 2014-06-24 (×2): 8 ug via INTRAVENOUS
  Administered 2014-06-24: 4 ug via INTRAVENOUS

## 2014-06-24 MED ORDER — ONDANSETRON HCL 4 MG/2ML IJ SOLN
4.0000 mg | Freq: Once | INTRAMUSCULAR | Status: AC | PRN
Start: 2014-06-24 — End: 2014-06-24

## 2014-06-24 MED ORDER — ALBUTEROL SULFATE (2.5 MG/3ML) 0.083% IN NEBU
2.5000 mg | INHALATION_SOLUTION | RESPIRATORY_TRACT | Status: DC
  Administered 2014-06-24 – 2014-06-29 (×26): 2.5 mg via RESPIRATORY_TRACT
  Filled 2014-06-24 (×26): qty 3

## 2014-06-24 MED ORDER — PROPOFOL 10 MG/ML IV BOLUS
INTRAVENOUS | Status: DC | PRN
  Administered 2014-06-24: 200 mg via INTRAVENOUS

## 2014-06-24 MED ORDER — 0.9 % SODIUM CHLORIDE (POUR BTL) OPTIME
TOPICAL | Status: DC | PRN
  Administered 2014-06-24: 1000 mL

## 2014-06-24 MED ORDER — FENTANYL CITRATE 0.05 MG/ML IJ SOLN
INTRAMUSCULAR | Status: DC | PRN
  Administered 2014-06-24 (×2): 50 ug via INTRAVENOUS

## 2014-06-24 MED ORDER — PROPOFOL 10 MG/ML IV BOLUS
INTRAVENOUS | Status: AC
  Filled 2014-06-24: qty 20

## 2014-06-24 MED ORDER — CHLORHEXIDINE GLUCONATE 0.12 % MT SOLN
15.0000 mL | Freq: Two times a day (BID) | OROMUCOSAL | Status: DC
  Administered 2014-06-24 – 2014-06-28 (×8): 15 mL via OROMUCOSAL
  Filled 2014-06-24 (×8): qty 15

## 2014-06-24 SURGICAL SUPPLY — 46 items
BLADE SURG 10 STRL SS (BLADE) ×16 IMPLANT
BNDG CONFORM 2 STRL LF (GAUZE/BANDAGES/DRESSINGS) IMPLANT
BNDG GAUZE ELAST 4 BULKY (GAUZE/BANDAGES/DRESSINGS) ×4 IMPLANT
CANISTER SUCTION 2500CC (MISCELLANEOUS) IMPLANT
CATH ROBINSON RED A/P 16FR (CATHETERS) IMPLANT
CLEANER TIP ELECTROSURG 2X2 (MISCELLANEOUS) ×4 IMPLANT
CONT SPEC 4OZ CLIKSEAL STRL BL (MISCELLANEOUS) ×4 IMPLANT
CORDS BIPOLAR (ELECTRODE) ×4 IMPLANT
COVER SURGICAL LIGHT HANDLE (MISCELLANEOUS) ×4 IMPLANT
DRAIN PENROSE 1/4X12 LTX STRL (WOUND CARE) IMPLANT
DRSG EMULSION OIL 3X3 NADH (GAUZE/BANDAGES/DRESSINGS) IMPLANT
DRSG PAD ABDOMINAL 8X10 ST (GAUZE/BANDAGES/DRESSINGS) ×4 IMPLANT
ELECT COATED BLADE 2.86 ST (ELECTRODE) ×4 IMPLANT
ELECT NEEDLE TIP 2.8 STRL (NEEDLE) IMPLANT
ELECT REM PT RETURN 9FT ADLT (ELECTROSURGICAL) ×4
ELECTRODE REM PT RTRN 9FT ADLT (ELECTROSURGICAL) ×2 IMPLANT
FORCEPS BIPOLAR SPETZLER 8 1.0 (NEUROSURGERY SUPPLIES) ×4 IMPLANT
GAUZE PACKING IODOFORM 1/4X5 (PACKING) IMPLANT
GAUZE SPONGE 4X4 12PLY STRL (GAUZE/BANDAGES/DRESSINGS) IMPLANT
GAUZE SPONGE 4X4 16PLY XRAY LF (GAUZE/BANDAGES/DRESSINGS) ×12 IMPLANT
GLOVE ECLIPSE 7.5 STRL STRAW (GLOVE) ×4 IMPLANT
GOWN STRL REUS W/ TWL LRG LVL3 (GOWN DISPOSABLE) ×4 IMPLANT
GOWN STRL REUS W/TWL LRG LVL3 (GOWN DISPOSABLE) ×4
KIT BASIN OR (CUSTOM PROCEDURE TRAY) ×4 IMPLANT
KIT ROOM TURNOVER OR (KITS) ×4 IMPLANT
NEEDLE HYPO 30X.5 LL (NEEDLE) ×4 IMPLANT
NS IRRIG 1000ML POUR BTL (IV SOLUTION) ×4 IMPLANT
PAD ARMBOARD 7.5X6 YLW CONV (MISCELLANEOUS) ×8 IMPLANT
PENCIL FOOT CONTROL (ELECTRODE) ×4 IMPLANT
POUCH STERILIZING 3 X22 (STERILIZATION PRODUCTS) IMPLANT
SPONGE GAUZE 4X4 12PLY STER LF (GAUZE/BANDAGES/DRESSINGS) ×4 IMPLANT
SPONGE LAP 18X18 X RAY DECT (DISPOSABLE) ×4 IMPLANT
SUT CHROMIC 4 0 P 3 18 (SUTURE) ×4 IMPLANT
SUT ETHILON 4 0 PS 2 18 (SUTURE) ×4 IMPLANT
SUT ETHILON 5 0 P 3 18 (SUTURE) ×2
SUT NYLON ETHILON 5-0 P-3 1X18 (SUTURE) ×2 IMPLANT
SUT SILK 3 0 TIES 10X30 (SUTURE) ×4 IMPLANT
SUT SILK 4 0 REEL (SUTURE) ×4 IMPLANT
SWAB COLLECTION DEVICE MRSA (MISCELLANEOUS) IMPLANT
SYR BULB IRRIGATION 50ML (SYRINGE) IMPLANT
SYR CONTROL 10ML LL (SYRINGE) ×4 IMPLANT
TOWEL OR 17X24 6PK STRL BLUE (TOWEL DISPOSABLE) ×4 IMPLANT
TOWEL OR 17X26 10 PK STRL BLUE (TOWEL DISPOSABLE) ×4 IMPLANT
TRAY ENT MC OR (CUSTOM PROCEDURE TRAY) ×4 IMPLANT
TUBE ANAEROBIC SPECIMEN COL (MISCELLANEOUS) IMPLANT
YANKAUER SUCT BULB TIP NO VENT (SUCTIONS) IMPLANT

## 2014-06-24 NOTE — Consult Note (Signed)
PULMONARY / CRITICAL CARE MEDICINE   Name: Eric Shaw MRN: [id number] DOB: August 08, 1960    ADMISSION DATE:  06/19/2014 CONSULTATION DATE:  06/24/14  REFERRING MD :  Marzetta Merino  CHIEF COMPLAINT:  Nec fasc, post op resp failure  INITIAL PRESENTATION: 54 yr old male, presents post op neck debridement secondary to nec fasc  STUDIES:  2/19 CT neck - severe cellulitis involving the posterior soft tissues of the occiput and upper cervical spine. Edema extends into the trapezius and semispinalis capitis musculature. 2/19 CT neck - Progressive posterior cervical/occipital cellulitis and myositis There is a mix of phlegmon and abscess in the bilateral posterior cervical space and left paraspinal compartment, as above. Stable mild retropharyngeal edema.  SIGNIFICANT EVENTS: 2/20 OR, abscess drainage, findings of nec fasc, left on vent, airway difficult  HISTORY OF PRESENT ILLNESS: Patient is 54 year old male with known type 2 diabetes mellitus, suicidal ideations in the past and requiring behavioral health hospitalization, morbid obesity, hypertension, presented to [facility] emergency department with main concern of progressively worsening neck pain, swelling, redness, tenderness to touch on 2/16. Patient reports he noticed swelling about one week prior to this admission and it has gotten progressively worse. Had CT neck on admission with cellulitis without focal collection. Some improvement with abx, declined , CT repeat with abscess. Taken to OR, with I and D findings nec fasc. Wound left open and vented. Some concerns airway difficulty.  PAST MEDICAL HISTORY :   has a past medical history of Plantar fasciitis, bilateral; Hypertension; High cholesterol; Type II diabetes mellitus; Sleep apnea; Arthritis; and Depression.  has past surgical history that includes Reduction of torsion of testis (Left, 1979). Prior to Admission medications   Medication Sig Start Date End Date Taking? Authorizing  Provider  ARIPiprazole (ABILIFY) 5 MG tablet Take 5 mg by mouth every morning.   Yes Historical Provider, MD  atenolol (TENORMIN) 50 MG tablet Take 50 mg by mouth daily.   Yes Historical Provider, MD  DULoxetine (CYMBALTA) 30 MG capsule Take 90 mg by mouth every evening.   Yes Historical Provider, MD  gabapentin (NEURONTIN) 300 MG capsule Take 600 mg by mouth 3 (three) times daily.   Yes Historical Provider, MD  insulin detemir (LEVEMIR) 100 UNIT/ML injection Inject 55 Units into the skin 2 (two) times daily.   Yes Historical Provider, MD  insulin lispro (HUMALOG) 100 UNIT/ML injection Inject 0-18 Units into the skin 3 (three) times daily after meals. 15 minutes after meals   Yes Historical Provider, MD  lisinopril (PRINIVIL,ZESTRIL) 10 MG tablet Take 1 tablet (10 mg total) by mouth daily. For hypertension and renal protection. 12/31/12  Yes Verne Spurr, PA-C  naproxen (NAPROSYN) 500 MG tablet Take 500 mg by mouth 2 (two) times daily as needed for moderate pain.   Yes Historical Provider, MD   No Known Allergies  FAMILY HISTORY:  has no family status information on file.  SOCIAL HISTORY:  reports that he has been smoking Cigarettes.  He has a 36 pack-year smoking history. He has never used smokeless tobacco. He reports that he drinks about 1.2 oz of alcohol per week. He reports that he does not use illicit drugs.  REVIEW OF SYSTEMS:  Unable, vented  SUBJECTIVE:  Sedated  VITAL SIGNS: Temp:  [98.6 F (37 C)] 98.6 F (37 C) (02/20 0622) Pulse Rate:  [85-89] 89 (02/20 0622) Resp:  [18] 18 (02/20 0622) BP: (145-160)/(72-73) 160/72 mmHg (02/20 0622) SpO2:  [99 %] 99 % (02/20 0622) Weight:  [  99.4 kg (219 lb 2.2 oz)] 99.4 kg (219 lb 2.2 oz) (02/20 0622) HEMODYNAMICS:   VENTILATOR SETTINGS:   INTAKE / OUTPUT:  Intake/Output Summary (Last 24 hours) at 06/24/14 1440 Last data filed at 06/24/14 1320  Gross per 24 hour  Intake   2625 ml  Output   2525 ml  Net    100 ml    PHYSICAL  EXAMINATION: General:  Sedated heavy Neuro:  rass -5, residual sedation from OR, moved lowers HEENT:  neck edema, wound open on back Cardiovascular:  s1 s2 RRR distant Lungs:  CTA Abdomen:  Soft, BS hypo, nt, nd, no r Musculoskeletal:  Old scar skin lowers , annular fibrotic  Skin:  As above  LABS:  CBC  Recent Labs Lab 06/22/14 0831 06/23/14 0755 06/24/14 0626  WBC 21.6* 18.2* 16.8*  HGB 12.5* 12.7* 13.5  HCT 36.4* 36.1* 39.1  PLT 305 355 396   Coag's No results for input(s): APTT, INR in the last 168 hours. BMET  Recent Labs Lab 06/22/14 0831 06/23/14 0755 06/24/14 0626  NA 130* 131* 135  K 3.7 3.5 4.1  CL 100 99 99  CO2 BUN 8 <5* <5*  CREATININE 0.61 0.56 0.67  GLUCOSE 77 171* 117*   Electrolytes  Recent Labs Lab 06/21/14 0753 06/22/14 0831 06/23/14 0755 06/24/14 0626  CALCIUM 8.0* 7.8* 7.8* 8.4  MG 2.3  --   --   --   PHOS 3.1  --   --   --    Sepsis Markers  Recent Labs Lab 06/19/14 1846 06/19/14 2118  LATICACIDVEN 2.1* 1.6   ABG No results for input(s): PHART, PCO2ART, PO2ART in the last 168 hours. Liver Enzymes  Recent Labs Lab 06/19/14 1311 06/21/14 0753  AST 28 30  ALT 23 19  ALKPHOS 178* 134*  BILITOT 1.1 1.1  ALBUMIN 1.8* 1.7*   Cardiac Enzymes No results for input(s): TROPONINI, PROBNP in the last 168 hours. Glucose  Recent Labs Lab 06/23/14 1209 06/23/14 1708 06/23/14 2209 06/24/14 0625 06/24/14 0754 06/24/14 1014  GLUCAP 211* 155* 147* 126* 118* 119*    Imaging Ct Soft Tissue Neck W Contrast  06/23/2014   CLINICAL DATA:  Ingrown hair on back of neck which became infected. Yellowish drainage from the wound.  EXAM: CT NECK WITH CONTRAST  TECHNIQUE: Multidetector CT imaging of the neck was performed using the standard protocol following the bolus administration of intravenous contrast.  CONTRAST:  75mL OMNIPAQUE IOHEXOL 300 MG/ML  SOLN  COMPARISON:  06/19/2014  FINDINGS: Progressive marked skin thickening  and subcutaneous reticulation/expansion throughout the posterior neck and occipital scalp, consistent with severe cellulitis. As before, there is expansion and edema in the bilateral upper trapezius consistent with myositis. There is also extensive inflammation deep to the trapezius, with thin fluid collections in the left more than right posterior cervical space fat. Below the deep fascia, in the paraspinal compartment, is increasingly discrete fluid between the left semispinalis capitis and splenius capitis, measuring 12 x 35 x 60 mm. This fluid has rim enhancement and mass effect and is most consistent with combination of abscess and phlegmon. There is mild retropharyngeal edema which is not progressed. No retropharyngeal abscess or phlegmon.  No evidence of intrathoracic or spinal infection.  Pharynx and larynx: Normal  Salivary glands: Normal  Thyroid: Normal  Lymph nodes: No separate adenopathy.  Vascular: Cervical carotid atherosclerosis. No major vessel occlusion.  Limited intracranial: Negative  Visualized orbits: Negative  Mastoids and visualized  paranasal sinuses: Clear  Skeleton: Congenitally narrow spinal canal with superimposed lower cervical degenerative disc disease. No evidence of osseous infection.  Upper chest: No evidence of pneumonia.  IMPRESSION: 1. Progressive posterior cervical/occipital cellulitis and myositis. There is a mix of phlegmon and abscess in the bilateral posterior cervical space and left paraspinal compartment, as above. 2. Stable mild retropharyngeal edema.   Electronically Signed   By: Marnee SpringJonathon  Watts M.D.   On: 06/23/2014 12:36     ASSESSMENT / PLAN:  PULMONARY OETT 2/20>>> A: Post op remained intubated, concern difficult airway, resp failure P:   8 cc/kg Obtain abg Get s/p ett pcxr for placement Can consider some spont SBT without extubation planned as back to OR Monday Will need leak testing in future pcxr in am  Dc acei in setting concern airway edema  etc  CARDIOVASCULAR CVL 2/20 (in or)>>> A: r/o severe sepsis, HTN P:  Assess lactic Pos balance ok Continued tenormin Obtain cvp  RENAL A:  sepsis P:   Obtain lactic Have pos balance Chem in am   GASTROINTESTINAL A:  Critical care nutrition needs P:   Consider start feeding today ppi  HEMATOLOGIC A:  DVT prevention, leukocytosis P:  Continue lovenox Cbc in am   INFECTIOUS A: Nec Fasc, abscess neck P:   BCx2 >>> Neck Abscess 2/20>>> Abx: 2/20 zosyn>>> 2/20 vanc>>> 2/20 clinda>>>  Follow OR cultures closely  ENDOCRINE A:  DM P:   Hold lantus as NPO Start feeds if able Cortisol if drops BP  NEUROLOGIC A:  Vent dyschrony, pain P:   RASS goal: -3 given airway concerns Dc dilaudid, oral narcs Low threshold dc abilify   FAMILY  - Updates: no family present  - Inter-disciplinary family meet or Palliative Care meeting due by:  2.27.16   Ccm time 40 min   Mcarthur Rossettianiel J. Tyson AliasFeinstein, MD, FACP Pgr: [phone] [facility]  Pulmonary and Critical Care Medicine [facility] Pager: [phone]  06/24/2014, 2:40 PM

## 2014-06-24 NOTE — Anesthesia Preprocedure Evaluation (Addendum)
Anesthesia Evaluation  Patient identified by MRN, date of birth, ID band Patient awake    Reviewed: Allergy & Precautions, NPO status , Patient's Chart, lab work & pertinent test results, reviewed documented beta blocker date and time   Airway Mallampati: II  TM Distance: >3 FB Neck ROM: Full  Mouth opening: Limited Mouth Opening  Dental  (+) Teeth Intact, Dental Advisory Given   Pulmonary sleep apnea and Continuous Positive Airway Pressure Ventilation , Current Smoker,  breath sounds clear to auscultation        Cardiovascular hypertension, Pt. on medications and Pt. on home beta blockers Rhythm:Regular Rate:Normal     Neuro/Psych Anxiety Depression    GI/Hepatic   Endo/Other  diabetes, Well Controlled, Type 2  Renal/GU      Musculoskeletal   Abdominal (+)  Abdomen: soft. Bowel sounds: normal.  Peds  Hematology   Anesthesia Other Findings   Reproductive/Obstetrics                          Anesthesia Physical Anesthesia Plan  ASA: III and emergent  Anesthesia Plan: General   Post-op Pain Management:    Induction: Intravenous  Airway Management Planned: Oral ETT  Additional Equipment:   Intra-op Plan:   Post-operative Plan:   Informed Consent: I have reviewed the patients History and Physical, chart, labs and discussed the procedure including the risks, benefits and alternatives for the proposed anesthesia with the patient or authorized representative who has indicated his/her understanding and acceptance.   Dental advisory given  Plan Discussed with: CRNA and Anesthesiologist  Anesthesia Plan Comments: (Abscess posterior neck Type 2 DM Htn  Plan GA with topical anesthesia and awake look with glide scope prior to induction  Kipp Broodavid Tatym Schermer)        Anesthesia Quick Evaluation

## 2014-06-24 NOTE — Progress Notes (Signed)
eLink Physician-Brief Progress Note Patient Name: Eric CraneJon Shaw DOB: 08-13-1960 MRN: [id number]   Date of Service  06/24/2014  HPI/Events of Note  bp's soft post op  Intake/Output Summary (Last 24 hours) at 06/24/14 1746 Last data filed at 06/24/14 1500  Gross per 24 hour  Intake 2923.55 ml  Output   2550 ml  Net 373.55 ml      cvp 3  eICU Interventions  NS 500 cc and change maint rx to 150 cc/h     Intervention Category Major Interventions: Hypotension - evaluation and management  Sandrea HughsMichael Daiel Strohecker 06/24/2014, 5:46 PM

## 2014-06-24 NOTE — Interval H&P Note (Signed)
History and Physical Interval Note:  06/24/2014 11:09 AM  [name]  has presented today for surgery, with the diagnosis of Posterior Neck abscess  The various methods of treatment have been discussed with the patient and family. After consideration of risks, benefits and other options for treatment, the patient has consented to  Procedure(s): INCISION AND DRAINAGE OF POSTERIOR NECK ABCESS (N/A) as a surgical intervention .  The patient's history has been reviewed, patient examined, no change in status, stable for surgery.  I have reviewed the patient's chart and labs.  Questions were answered to the patient's satisfaction.     Martel Galvan

## 2014-06-24 NOTE — Op Note (Signed)
OPERATIVE REPORT  DATE OF SURGERY: 06/24/2014  PATIENT:  Eric Shaw,  54 y.o. male  PRE-OPERATIVE DIAGNOSIS:  Posterior Neck abscess  POST-OPERATIVE DIAGNOSIS:  Posterior Neck necrotizing fasciitis  PROCEDURE:  Procedure(s): INCISION AND DRAINAGE OF POSTERIOR NECK ABCESS DEBRIDEMENT OF NECROTIZING FASCITIS ON POSTERIOR NECK  SURGEON:  Susy FrizzleJefry H Christia Domke, MD  ASSISTANTS: None  ANESTHESIA:   General   EBL:  50 ml  DRAINS: None  LOCAL MEDICATIONS USED:  None  SPECIMEN:  Posterior neck tissue debridement  COUNTS:  Correct  PROCEDURE DETAILS: The patient was taken to the operating room and placed on the operating table in the supine position. Following induction of general endotracheal anesthesia, he was turned to the prone position. The neck was prepped and draped in a standard fashion. An 18-gauge needle was used on a 10 mL syringe to aspirate pus from several different locations. This was sent for aerobic and anaerobic culture and sensitivity testing. A transverse incision was then performed using the electrocautery from about the level of the right ear to the left ear. No bleeding. Vision continued down through subcutaneous tissue and through necrotic subcutaneous fat. Using self retaining retractors, the wound was opened widely. With manual pressure, pus exuded from all areas. The necrotic tissue was widely debrided down to what appeared to be viable tissue. Tissue planes were not identified. There were no vascular or neurologic structures identified. After adequate debridement was performed, the wound was irrigated with saline. The wound was packed with a large Kerlix, requiring the entire Kerlix. Dressing was applied. Patient was repositioned supine and remained intubated. He was transferred to intensive care in critical condition. Plan is to keep him intubated and sedated and return to the operating room in 48 hours to repeat debridement.    PATIENT DISPOSITION:  To PACU,  stable

## 2014-06-24 NOTE — Anesthesia Postprocedure Evaluation (Signed)
  Anesthesia Post-op Note  Patient: Pierce CraneJon Legrand  Procedure(s) Performed: Procedure(s): INCISION AND DRAINAGE OF POSTERIOR NECK ABCESS (N/A) DEBRIDEMENT OF NECROTIZING FASCITIS ON POSTERIOR NECK (N/A)  Patient Location: ICU  Anesthesia Type:General  Level of Consciousness: sedated and Patient remains intubated per anesthesia plan  Airway and Oxygen Therapy: Patient remains intubated per anesthesia plan and Patient placed on Ventilator (see vital sign flow sheet for setting)  Post-op Pain: none  Post-op Assessment: Post-op Vital signs reviewed, Patient's Cardiovascular Status Stable, Respiratory Function Stable and Patent Airway  Post-op Vital Signs: stable  Last Vitals:  Filed Vitals:   06/24/14 1600  BP: 129/78  Pulse: 83  Temp: 36.7 C  Resp: 16    Complications: No apparent anesthesia complications

## 2014-06-24 NOTE — H&P (View-Only) (Signed)
Reason for Consult: Neck abscess Referring Physician: Verlee Monte, MD  Eric Shaw is an 54 y.o. male.  HPI: 3 week history of os tear neck pain and swelling. While in the hospital, it got a little bit better but then started getting worse again. No history of MRSA that he knows of. Long history of diabetes.  Past Medical History  Diagnosis Date  . Plantar fasciitis, bilateral   . Hypertension   . High cholesterol   . Type II diabetes mellitus   . Sleep apnea     "not suppose to wear mask" (06/19/2014)  . Arthritis     "all 10 toes" (06/19/2014)  . Depression     Past Surgical History  Procedure Laterality Date  . Reduction of torsion of testis Left 1979    History reviewed. No pertinent family history.  Social History:  reports that he has been smoking Cigarettes.  He has a 36 pack-year smoking history. He has never used smokeless tobacco. He reports that he drinks about 1.2 oz of alcohol per week. He reports that he does not use illicit drugs.  Allergies: No Known Allergies  Medications: Reviewed  Results for orders placed or performed during the hospital encounter of 06/19/14 (from the past 48 hour(s))  Glucose, capillary     Status: Abnormal   Collection Time: 06/21/14  5:51 PM  Result Value Ref Range   Glucose-Capillary 57 (L) 70 - 99 mg/dL  Glucose, capillary     Status: Abnormal   Collection Time: 06/21/14 10:18 PM  Result Value Ref Range   Glucose-Capillary 117 (H) 70 - 99 mg/dL   Comment 1 Notify RN    Comment 2 Documented in Char   Glucose, capillary     Status: None   Collection Time: 06/22/14  8:03 AM  Result Value Ref Range   Glucose-Capillary 71 70 - 99 mg/dL  Basic metabolic panel     Status: Abnormal   Collection Time: 06/22/14  8:31 AM  Result Value Ref Range   Sodium 130 (L) 135 - 145 mmol/L   Potassium 3.7 3.5 - 5.1 mmol/L   Chloride 100 96 - 112 mmol/L   CO2 25 19 - 32 mmol/L   Glucose, Bld 77 70 - 99 mg/dL   BUN 8 6 - 23 mg/dL    Creatinine, Ser 0.61 0.50 - 1.35 mg/dL   Calcium 7.8 (L) 8.4 - 10.5 mg/dL   GFR calc non Af Amer >90 >90 mL/min   GFR calc Af Amer >90 >90 mL/min    Comment: (NOTE) The eGFR has been calculated using the CKD EPI equation. This calculation has not been validated in all clinical situations. eGFR's persistently <90 mL/min signify possible Chronic Kidney Disease.    Anion gap 5 5 - 15  CBC     Status: Abnormal   Collection Time: 06/22/14  8:31 AM  Result Value Ref Range   WBC 21.6 (H) 4.0 - 10.5 K/uL   RBC 4.16 (L) 4.22 - 5.81 MIL/uL   Hemoglobin 12.5 (L) 13.0 - 17.0 g/dL   HCT 36.4 (L) 39.0 - 52.0 %   MCV 87.5 78.0 - 100.0 fL   MCH 30.0 26.0 - 34.0 pg   MCHC 34.3 30.0 - 36.0 g/dL   RDW 13.5 11.5 - 15.5 %   Platelets 305 150 - 400 K/uL  Glucose, capillary     Status: Abnormal   Collection Time: 06/22/14 12:51 PM  Result Value Ref Range   Glucose-Capillary 134 (H) 70 -  99 mg/dL  Glucose, capillary     Status: Abnormal   Collection Time: 06/22/14  5:49 PM  Result Value Ref Range   Glucose-Capillary 166 (H) 70 - 99 mg/dL  Glucose, capillary     Status: Abnormal   Collection Time: 06/22/14 10:31 PM  Result Value Ref Range   Glucose-Capillary 227 (H) 70 - 99 mg/dL   Comment 1 Notify RN    Comment 2 Documented in Char   Glucose, capillary     Status: Abnormal   Collection Time: 06/23/14 12:10 AM  Result Value Ref Range   Glucose-Capillary 255 (H) 70 - 99 mg/dL  Basic metabolic panel     Status: Abnormal   Collection Time: 06/23/14  7:55 AM  Result Value Ref Range   Sodium 131 (L) 135 - 145 mmol/L   Potassium 3.5 3.5 - 5.1 mmol/L   Chloride 99 96 - 112 mmol/L   CO2 25 19 - 32 mmol/L   Glucose, Bld 171 (H) 70 - 99 mg/dL   BUN <5 (L) 6 - 23 mg/dL   Creatinine, Ser 0.56 0.50 - 1.35 mg/dL   Calcium 7.8 (L) 8.4 - 10.5 mg/dL   GFR calc non Af Amer >90 >90 mL/min   GFR calc Af Amer >90 >90 mL/min    Comment: (NOTE) The eGFR has been calculated using the CKD EPI equation. This  calculation has not been validated in all clinical situations. eGFR's persistently <90 mL/min signify possible Chronic Kidney Disease.    Anion gap 7 5 - 15  CBC     Status: Abnormal   Collection Time: 06/23/14  7:55 AM  Result Value Ref Range   WBC 18.2 (H) 4.0 - 10.5 K/uL   RBC 4.14 (L) 4.22 - 5.81 MIL/uL   Hemoglobin 12.7 (L) 13.0 - 17.0 g/dL   HCT 36.1 (L) 39.0 - 52.0 %   MCV 87.2 78.0 - 100.0 fL   MCH 30.7 26.0 - 34.0 pg   MCHC 35.2 30.0 - 36.0 g/dL   RDW 13.6 11.5 - 15.5 %   Platelets 355 150 - 400 K/uL  Glucose, capillary     Status: Abnormal   Collection Time: 06/23/14  8:09 AM  Result Value Ref Range   Glucose-Capillary 172 (H) 70 - 99 mg/dL  Glucose, capillary     Status: Abnormal   Collection Time: 06/23/14 12:09 PM  Result Value Ref Range   Glucose-Capillary 211 (H) 70 - 99 mg/dL    Ct Soft Tissue Neck W Contrast  06/23/2014   CLINICAL DATA:  Ingrown hair on back of neck which became infected. Yellowish drainage from the wound.  EXAM: CT NECK WITH CONTRAST  TECHNIQUE: Multidetector CT imaging of the neck was performed using the standard protocol following the bolus administration of intravenous contrast.  CONTRAST:  63m OMNIPAQUE IOHEXOL 300 MG/ML  SOLN  COMPARISON:  06/19/2014  FINDINGS: Progressive marked skin thickening and subcutaneous reticulation/expansion throughout the posterior neck and occipital scalp, consistent with severe cellulitis. As before, there is expansion and edema in the bilateral upper trapezius consistent with myositis. There is also extensive inflammation deep to the trapezius, with thin fluid collections in the left more than right posterior cervical space fat. Below the deep fascia, in the paraspinal compartment, is increasingly discrete fluid between the left semispinalis capitis and splenius capitis, measuring 12 x 35 x 60 mm. This fluid has rim enhancement and mass effect and is most consistent with combination of abscess and phlegmon. There is  mild  retropharyngeal edema which is not progressed. No retropharyngeal abscess or phlegmon.  No evidence of intrathoracic or spinal infection.  Pharynx and larynx: Normal  Salivary glands: Normal  Thyroid: Normal  Lymph nodes: No separate adenopathy.  Vascular: Cervical carotid atherosclerosis. No major vessel occlusion.  Limited intracranial: Negative  Visualized orbits: Negative  Mastoids and visualized paranasal sinuses: Clear  Skeleton: Congenitally narrow spinal canal with superimposed lower cervical degenerative disc disease. No evidence of osseous infection.  Upper chest: No evidence of pneumonia.  IMPRESSION: 1. Progressive posterior cervical/occipital cellulitis and myositis. There is a mix of phlegmon and abscess in the bilateral posterior cervical space and left paraspinal compartment, as above. 2. Stable mild retropharyngeal edema.   Electronically Signed   By: Monte Fantasia M.D.   On: 06/23/2014 12:36    XOV:ANVBTYOM except as listed in admit H&P  Blood pressure 163/84, pulse 81, temperature 98.6 F (37 C), temperature source Oral, resp. rate 15, height _0  (1.778 m), weight 106.595 kg (235 lb), SpO2 92 %.  PHYSICAL EXAM: Overall appearance:  Not acutely toxic. Head:  Normocephalic, atraumatic. Ears: External ears look normal. Nose: External nose is healthy in appearance. Internal nasal exam free of any lesions or obstruction. Oral Cavity:  There are no mucosal lesions or masses identified. Neuro:  No identifiable neurologic deficits. Neck:  Massive swelling of the posterior neck with some desquamation, pustules and severe induration. He has basically no range of motion because of the swelling and tenderness.  Studies Reviewed: neck CT   Assessment/Plan: Plan incision and drainage tomorrow morning. Risks and benefits were discussed. All questions were answered.  Nariya Neumeyer 06/23/2014, 4:33 PM

## 2014-06-24 NOTE — Transfer of Care (Signed)
Immediate Anesthesia Transfer of Care Note  Patient: Eric CraneJon Shaw  Procedure(s) Performed: Procedure(s): INCISION AND DRAINAGE OF POSTERIOR NECK ABCESS (N/A) DEBRIDEMENT OF NECROTIZING FASCITIS ON POSTERIOR NECK (N/A)  Patient Location: ICU  Anesthesia Type:General  Level of Consciousness: Patient remains intubated per anesthesia plan  Airway & Oxygen Therapy: Patient remains intubated per anesthesia plan  Post-op Assessment: Post -op Vital signs reviewed and stable  Post vital signs: stable  Last Vitals:  Filed Vitals:   06/24/14 0622  BP: 160/72  Pulse: 89  Temp: 37 C  Resp: 18    Complications: Difficult intubation.  SEE NOTE, pt remains intubated per plan

## 2014-06-24 NOTE — Progress Notes (Signed)
One gray colored earring put in denture cup, labeled, and placed with chart.

## 2014-06-24 NOTE — Progress Notes (Signed)
Anesthesiology Note:  54 year old male with Type 2 DM and hypertension underwent I and D and debridement of posterior neck abscess and necrotizing fascitis. Prior to surgery, he had very limited neck extension and  limited mouth opening.  He was sedated prior to induction with versed, fentanyl, and precedex. Upper airway topical anesthesia with bilateral superior laryngeal and glossopharyngeal nerve blocks using 4% lidocaine soaked gauze. DL x1 with video laryngoscope. Vocal cords visualized. Decision made to proceed to rapid sequence induction.   After induction larynx could be visualized with video laryngoscope but I was unable to insert ETT due to limited mouth opening and friable mucosa which bled easily.  Patient then ventilated with #4 LMA with good gas exchange.  DL repeated with Hyacinth MeekerMiller 3 , base of arytenoids seen, 7.5 ETT passed over eischmann stylet (i.e. blue bougie). BBSE tube secured at 21 cm at lip.  Patient will need to return to surgery for repeat debridements. Decision made to leave patient intubated due to difficult airway.  3 lumen central line started in OR for IV access.  Kipp Broodavid Kerrick Miler

## 2014-06-24 NOTE — Progress Notes (Signed)
PATIENT DETAILS Name: Eric Shaw Age: 54 y.o. Sex: male Date of Birth: 1961-03-11 Admit Date: 06/19/2014 Admitting Physician Dorothea Ogle, MD PCP:No PCP Per Patient  Subjective: Complains of significant neck pain.  Assessment/Plan: Active Problems:   Neck abscess: Patient was admitted with neck pain and swelling, initial CT scan of the neck showed significant cellulitis, unfortunately even with IV antibiotics patient continued to have worsening pain and swelling. Repeat CT scan of the neck showed development of abscess. ENT was consulted, patient scheduled for incision and drainage on 2/20. Wound culture positive for MSSA, initially was on vancomycin and Zosyn, now has been narrowed to just Ancef. Still with leukocytosis, although decreasing while on antibiotics.    DM2 (diabetes mellitus, type 2) uncontrolled: CBGs currently stable with Lantus 70 units daily at bedtime and SSI. Continue, will continue to follow CBGs and adjust insulin regimen accordingly. A1c on 06/19/14 significantly elevated at 11.5.    Hyponatremia: Likely secondary to dehydration, resolved with IV fluids. Monitor electrolytes periodically.    Hypertension: Moderate controlled with atenolol and lisinopril. Suspect pain contributing to elevated BP at this point.    Diabetic neuropathy: Continue with Neurontin and Cymbalta    MDD (major depressive disorder): Stable, continue with Abilify and Cymbalta  Disposition: Remain inpatient  Antibiotics:  See belwo   Anti-infectives    Start     Dose/Rate Route Frequency Ordered Stop   06/22/14 1400  [MAR Hold]  ceFAZolin (ANCEF) IVPB 2 g/50 mL premix     (MAR Hold since 06/24/14 1032)   2 g 100 mL/hr over 30 Minutes Intravenous 3 times per day 06/22/14 1011     06/21/14 1800  ceFAZolin (ANCEF) IVPB 2 g/50 mL premix  Status:  Discontinued     2 g 100 mL/hr over 30 Minutes Intravenous 4 times per day 06/21/14 1505 06/22/14 1011   06/20/14 2000  vancomycin  (VANCOCIN) IVPB 1000 mg/200 mL premix  Status:  Discontinued     1,000 mg 200 mL/hr over 60 Minutes Intravenous Every 12 hours 06/20/14 1123 06/21/14 1503   06/20/14 0800  vancomycin (VANCOCIN) IVPB 750 mg/150 ml premix  Status:  Discontinued     750 mg 150 mL/hr over 60 Minutes Intravenous Every 12 hours 06/19/14 1636 06/20/14 1123   06/20/14 0800  ciprofloxacin (CIPRO) IVPB 400 mg  Status:  Discontinued     400 mg 200 mL/hr over 60 Minutes Intravenous Every 12 hours 06/20/14 0735 06/21/14 0925   06/20/14 0100  piperacillin-tazobactam (ZOSYN) IVPB 3.375 g  Status:  Discontinued     3.375 g 12.5 mL/hr over 240 Minutes Intravenous Every 8 hours 06/19/14 1820 06/21/14 1503   06/19/14 1830  piperacillin-tazobactam (ZOSYN) IVPB 3.375 g     3.375 g 100 mL/hr over 30 Minutes Intravenous  Once 06/19/14 1812 06/19/14 1922   06/19/14 1645  vancomycin (VANCOCIN) 2,000 mg in sodium chloride 0.9 % 500 mL IVPB     2,000 mg 250 mL/hr over 120 Minutes Intravenous  Once 06/19/14 1634 06/19/14 1913      DVT Prophylaxis: Prophylactic Lovenox   Code Status: Full code   Family Communication None  Procedures:  None  CONSULTS:  ENT  Time spent 40 minutes-which includes 50% of the time with face-to-face with patient/ family and coordinating care related to the above assessment and plan.  MEDICATIONS: Scheduled Meds: . [MAR Hold] ARIPiprazole  5 mg Oral q morning - 10a  . [MAR Hold] atenolol  50 mg Oral Daily  . [MAR Hold]  ceFAZolin (ANCEF) IV  2 g Intravenous 3 times per day  . [MAR Hold] Chlorhexidine Gluconate Cloth  6 each Topical Daily  . [MAR Hold] DULoxetine  90 mg Oral QPM  . [MAR Hold] enoxaparin (LOVENOX) injection  40 mg Subcutaneous Q24H  . [MAR Hold] gabapentin  600 mg Oral TID  . [MAR Hold] insulin aspart  0-20 Units Subcutaneous TID WC  . [MAR Hold] insulin glargine  70 Units Subcutaneous QHS  . [MAR Hold] lisinopril  10 mg Oral Daily  . [MAR Hold] mupirocin ointment  1  application Nasal BID   Continuous Infusions: . sodium chloride 75 mL/hr at 06/24/14 0151  . lactated ringers 10 mL/hr at 06/24/14 1055   PRN Meds:.[MAR Hold] HYDROcodone-acetaminophen, [MAR Hold]  HYDROmorphone (DILAUDID) injection, [MAR Hold] ondansetron **OR** [MAR Hold] ondansetron (ZOFRAN) IV    PHYSICAL EXAM: Vital signs in last 24 hours: Filed Vitals:   06/23/14 0922 06/23/14 1355 06/23/14 2134 06/24/14 0622  BP: 140/69 163/84 145/73 160/72  Pulse: 84 81 85 89  Temp:  98.6 F (37 C) 98.6 F (37 C) 98.6 F (37 C)  TempSrc:  Oral Oral Oral  Resp:   18 18  Height:      Weight:    99.4 kg (219 lb 2.2 oz)  SpO2:  92% 99% 99%    Weight change:  Filed Weights   06/19/14 1214 06/19/14 1827 06/24/14 0622  Weight: 106.595 kg (235 lb) 106.595 kg (235 lb) 99.4 kg (219 lb 2.2 oz)   Body mass index is 31.44 kg/(m^2).   Gen Exam: Awake and alert with clear speech.   Neck: Massive swelling of the posterior neck with some desquamation, pustules and severe induration Chest: B/L Clear.   CVS: S1 S2 Regular, no murmurs.  Abdomen: soft, BS +, non tender, non distended.  Extremities: no edema, lower extremities warm to touch. Neurologic: Non Focal.   Skin: No Rash.   Wounds: N/A.   Intake/Output from previous day:  Intake/Output Summary (Last 24 hours) at 06/24/14 1104 Last data filed at 06/24/14 [id number]  Gross per 24 hour  Intake 1928.75 ml  Output   2525 ml  Net -596.25 ml     LAB RESULTS: CBC  Recent Labs Lab 06/19/14 1311 06/20/14 0540 06/21/14 0753 06/22/14 0831 06/23/14 0755 06/24/14 0626  WBC 33.8* 29.6* 29.2* 21.6* 18.2* 16.8*  HGB 14.9 14.6 13.8 12.5* 12.7* 13.5  HCT 41.2 41.2 39.0 36.4* 36.1* 39.1  PLT 328 302 279 305 355 396  MCV 86.2 86.9 89.9 87.5 87.2 88.7  MCH 31.2 30.8 31.8 30.0 30.7 30.6  MCHC 36.2* 35.4 35.4 34.3 35.2 34.5  RDW 12.7 13.0 13.5 13.5 13.6 13.7  LYMPHSABS 2.0  --   --   --   --   --   MONOABS 1.4*  --   --   --   --   --     EOSABS 0.0  --   --   --   --   --   BASOSABS 0.0  --   --   --   --   --     Chemistries   Recent Labs Lab 06/20/14 0540 06/21/14 0753 06/22/14 0831 06/23/14 0755 06/24/14 0626  NA 127* 129* 130* 131* 135  K 4.2 3.9 3.7 3.5 4.1  CL 96 97 100 99 99  CO2 17* 27 25 25 31   GLUCOSE 211* 78 77 171* 117*  BUN  17 16 8  <5* <5*  CREATININE 0.80 0.76 0.61 0.56 0.67  CALCIUM 8.2* 8.0* 7.8* 7.8* 8.4  MG  --  2.3  --   --   --     CBG:  Recent Labs Lab 06/23/14 1708 06/23/14 2209 06/24/14 0625 06/24/14 0754 06/24/14 1014  GLUCAP 155* 147* 126* 118* 119*    GFR Estimated Creatinine Clearance: 127.7 mL/min (by C-G formula based on Cr of 0.67).  Coagulation profile No results for input(s): INR, PROTIME in the last 168 hours.  Cardiac Enzymes No results for input(s): CKMB, TROPONINI, MYOGLOBIN in the last 168 hours.  Invalid input(s): CK  Invalid input(s): POCBNP No results for input(s): DDIMER in the last 72 hours. No results for input(s): HGBA1C in the last 72 hours. No results for input(s): CHOL, HDL, LDLCALC, TRIG, CHOLHDL, LDLDIRECT in the last 72 hours. No results for input(s): TSH, T4TOTAL, T3FREE, THYROIDAB in the last 72 hours.  Invalid input(s): FREET3 No results for input(s): VITAMINB12, FOLATE, FERRITIN, TIBC, IRON, RETICCTPCT in the last 72 hours. No results for input(s): LIPASE, AMYLASE in the last 72 hours.  Urine Studies No results for input(s): UHGB, CRYS in the last 72 hours.  Invalid input(s): UACOL, UAPR, USPG, UPH, UTP, UGL, UKET, UBIL, UNIT, UROB, ULEU, UEPI, UWBC, URBC, UBAC, CAST, UCOM, BILUA  MICROBIOLOGY: Recent Results (from the past 240 hour(s))  Wound culture     Status: None   Collection Time: 06/19/14  5:05 PM  Result Value Ref Range Status   Specimen Description WOUND NECK  Final   Special Requests Immunocompromised  Final   Gram Stain   Final    RARE WBC PRESENT, PREDOMINANTLY PMN NO SQUAMOUS EPITHELIAL CELLS SEEN MODERATE GRAM  POSITIVE COCCI IN PAIRS Performed at [facility]    Culture   Final    ABUNDANT STAPHYLOCOCCUS AUREUS Note: RIFAMPIN AND GENTAMICIN SHOULD NOT BE USED AS SINGLE DRUGS FOR TREATMENT OF STAPH INFECTIONS. Performed at [facility]    Report Status 06/21/2014 FINAL  Final   Organism ID, Bacteria STAPHYLOCOCCUS AUREUS  Final      Susceptibility   Staphylococcus aureus - MIC*    CLINDAMYCIN <=0.25 SENSITIVE Sensitive     ERYTHROMYCIN <=0.25 SENSITIVE Sensitive     GENTAMICIN <=0.5 SENSITIVE Sensitive     LEVOFLOXACIN 0.25 SENSITIVE Sensitive     OXACILLIN 0.5 SENSITIVE Sensitive     PENICILLIN >=0.5 RESISTANT Resistant     RIFAMPIN <=0.5 SENSITIVE Sensitive     TRIMETH/SULFA <=10 SENSITIVE Sensitive     VANCOMYCIN <=0.5 SENSITIVE Sensitive     TETRACYCLINE <=1 SENSITIVE Sensitive     MOXIFLOXACIN <=0.25 SENSITIVE Sensitive     * ABUNDANT STAPHYLOCOCCUS AUREUS  Culture, blood (routine x 2)     Status: None (Preliminary result)   Collection Time: 06/19/14  6:46 PM  Result Value Ref Range Status   Specimen Description BLOOD LEFT ARM  Final   Special Requests BOTTLES DRAWN AEROBIC AND ANAEROBIC 6CC  Final   Culture   Final           BLOOD CULTURE RECEIVED NO GROWTH TO DATE CULTURE WILL BE HELD FOR 5 DAYS BEFORE ISSUING A FINAL NEGATIVE REPORT Performed at [facility]    Report Status PENDING  Incomplete  Culture, blood (routine x 2)     Status: None (Preliminary result)   Collection Time: 06/19/14  7:02 PM  Result Value Ref Range Status   Specimen Description BLOOD LEFT HAND  Final  Special Requests   Final    BOTTLES DRAWN AEROBIC AND ANAEROBIC 5CC BLUE 4CC PURPLE   Culture   Final           BLOOD CULTURE RECEIVED NO GROWTH TO DATE CULTURE WILL BE HELD FOR 5 DAYS BEFORE ISSUING A FINAL NEGATIVE REPORT Performed at [facility]    Report Status PENDING  Incomplete  Surgical pcr screen     Status: Abnormal   Collection Time: 06/24/14  5:43 AM    Result Value Ref Range Status   MRSA, PCR NEGATIVE NEGATIVE Final   Staphylococcus aureus POSITIVE (A) NEGATIVE Final    Comment:        The Xpert SA Assay (FDA approved for NASAL specimens in patients over 21 years of age), is one component of a comprehensive surveillance program.  Test performance has been validated by [facility] for patients greater than or equal to 44 year old. It is not intended to diagnose infection nor to guide or monitor treatment. RESULT CALLED TO, READ BACK BY AND VERIFIED WITHElberta Leatherwood RN [phone] GREEN R     RADIOLOGY STUDIES/RESULTS: Ct Soft Tissue Neck W Contrast  06/23/2014   CLINICAL DATA:  Ingrown hair on back of neck which became infected. Yellowish drainage from the wound.  EXAM: CT NECK WITH CONTRAST  TECHNIQUE: Multidetector CT imaging of the neck was performed using the standard protocol following the bolus administration of intravenous contrast.  CONTRAST:  75mL OMNIPAQUE IOHEXOL 300 MG/ML  SOLN  COMPARISON:  06/19/2014  FINDINGS: Progressive marked skin thickening and subcutaneous reticulation/expansion throughout the posterior neck and occipital scalp, consistent with severe cellulitis. As before, there is expansion and edema in the bilateral upper trapezius consistent with myositis. There is also extensive inflammation deep to the trapezius, with thin fluid collections in the left more than right posterior cervical space fat. Below the deep fascia, in the paraspinal compartment, is increasingly discrete fluid between the left semispinalis capitis and splenius capitis, measuring 12 x 35 x 60 mm. This fluid has rim enhancement and mass effect and is most consistent with combination of abscess and phlegmon. There is mild retropharyngeal edema which is not progressed. No retropharyngeal abscess or phlegmon.  No evidence of intrathoracic or spinal infection.  Pharynx and larynx: Normal  Salivary glands: Normal  Thyroid: Normal  Lymph nodes: No  separate adenopathy.  Vascular: Cervical carotid atherosclerosis. No major vessel occlusion.  Limited intracranial: Negative  Visualized orbits: Negative  Mastoids and visualized paranasal sinuses: Clear  Skeleton: Congenitally narrow spinal canal with superimposed lower cervical degenerative disc disease. No evidence of osseous infection.  Upper chest: No evidence of pneumonia.  IMPRESSION: 1. Progressive posterior cervical/occipital cellulitis and myositis. There is a mix of phlegmon and abscess in the bilateral posterior cervical space and left paraspinal compartment, as above. 2. Stable mild retropharyngeal edema.   Electronically Signed   By: Marnee Spring M.D.   On: 06/23/2014 12:36   Ct Soft Tissue Neck W Contrast  06/19/2014   CLINICAL DATA:  54 year old male with draining soft tissue infection which reportedly began as an ingrown hair.  EXAM: CT NECK WITH CONTRAST  TECHNIQUE: Multidetector CT imaging of the neck was performed using the standard protocol following the bolus administration of intravenous contrast.  CONTRAST:  OMNIPAQUE IOHEXOL 300 MG/ML  SOLN  COMPARISON:  Prior CT scan of the face and orbits 03/09/2013  FINDINGS: Pharynx and larynx: Within normal limits. No asymmetric soft tissue fullness or enhancement.  Salivary glands: Within normal limits.  Thyroid: Normal.  Lymph nodes: No pathologic adenopathy identified.  Vascular: Mild bilateral atherosclerotic calcifications in the proximal internal carotid arteries.  Limited intracranial: Within normal limits  Visualized orbits: Intact and symmetric bilaterally.  Mastoids and visualized paranasal sinuses: Normal aeration.  Skeleton: No acute fracture or aggressive appearing lytic or blastic osseous lesion.  Upper chest: Unremarkable.  Other: Extensive skin thickening and reticulation of the subcutaneous fat beginning in the occipital skull and extending inferiorly to the upper back. Edema tracks into the trapezius and semi spinalis  capitis musculature. No focal fluid collection to suggest abscess. Degenerative disc disease with posterior disc osteophyte complex at C7-T1. Mild central stenosis.  IMPRESSION: 1. CT findings are most consistent with severe cellulitis involving the posterior soft tissues of the occiput and upper cervical spine. Edema extends into the trapezius and semispinalis capitis musculature. However, there is no focal fluid collection to suggest abscess. 2. Mild atherosclerotic calcifications in the bilateral proximal internal carotid arteries. 3. Focal degenerative disc disease with mild central stenosis at C7-T1.   Electronically Signed   By: Malachy Moan M.D.   On: 06/19/2014 16:29   Dg Chest Port 1 View  06/20/2014   CLINICAL DATA:  Shortness of breath and weakness  EXAM: PORTABLE CHEST - 1 VIEW  COMPARISON:  01/19/2013  FINDINGS: The heart size and mediastinal contours are at upper limits of normal, unchanged. Both lungs are clear allowing for hypoaeration and crowding of the bronchovascular markings. The visualized skeletal structures are unremarkable.  IMPRESSION: Low lung volumes with crowding of the bronchovascular markings but no focal acute finding.   Electronically Signed   By: Christiana Pellant M.D.   On: 06/20/2014 16:37    Jeoffrey Massed, MD  [facility] Pager:[phone]  If 7PM-7AM, please contact night-coverage [email or web address] Password [email or web address] 06/24/2014, 11:04 AM   LOS: 5 days

## 2014-06-24 NOTE — Progress Notes (Signed)
Placed on contact isolation d/t neck wound ?necrotizing fascitis. Wound culture sent by OR staff. Will continue to monitor.

## 2014-06-24 NOTE — Anesthesia Procedure Notes (Signed)
Procedure Name: Intubation Date/Time: 06/24/2014 12:47 PM Performed by: Ellin GoodieWEAVER, Itha Kroeker M Pre-anesthesia Checklist: Patient identified, Emergency Drugs available, Suction available, Patient being monitored and Timeout performed Patient Re-evaluated:Patient Re-evaluated prior to inductionOxygen Delivery Method: Circle system utilized Preoxygenation: Pre-oxygenation with 100% oxygen Intubation Type: IV induction, Cricoid Pressure applied and Rapid sequence Ventilation: Mask ventilation without difficulty and Oral airway inserted - appropriate to patient size Laryngoscope Size: Miller and 3 Grade View: Grade IV Tube type: Oral Tube size: 7.5 mm Number of attempts: 3 Airway Equipment and Method: Video-laryngoscopy,  Fiberoptic brochoscope and Bougie stylet Placement Confirmation: positive ETCO2 and breath sounds checked- equal and bilateral Secured at: 22 cm Tube secured with: Tape Dental Injury: Bloody posterior oropharynx  Difficulty Due To: Difficulty was anticipated, Difficult Airway-  due to neck instability, Difficult Airway- due to reduced neck mobility, Difficult Airway- due to limited oral opening and Difficult Airway-  due to edematous airway Future Recommendations: Recommend- induction with short-acting agent, and alternative techniques readily available Comments: Difficult intubation anticipated.  Videolarygoscope and emergency airway cart in room.  Dr Noreene LarssonJoslin topicalized airway with 4% lidocaine.  Precedex, versed, and fentanyl administered.  DL with videolaryngoscope x 2 with view of cords but ETT would not pass.  Attempted intubation over LMA #5 without success.  Intubation with Miller 3 blade by Dr. Noreene LarssonJoslin achieved over bougie stylet with cricoid pressure on 4th attempt.  ETT position verified and tube secured.  Pt prone for procedure.  Carlynn HeraldH Weaver, CRNA

## 2014-06-24 NOTE — Progress Notes (Signed)
eLink Physician-Brief Progress Note Patient Name: Eric Shaw DOB: 03/13/1961 MRN: [id number]   Date of Service  06/24/2014  HPI/Events of Note  Nursing requesting propofol be continued from ER as goal is to keep pt heavily sedated for now  eICU Interventions  Continue propofol      Intervention Category Major Interventions: Delirium, psychosis, severe agitation - evaluation and management  Sandrea HughsMichael Dian Laprade 06/24/2014, 4:01 PM

## 2014-06-25 ENCOUNTER — Ambulatory Visit: Payer: Self-pay | Admitting: Otolaryngology

## 2014-06-25 ENCOUNTER — Inpatient Hospital Stay (HOSPITAL_COMMUNITY): Payer: Self-pay

## 2014-06-25 DIAGNOSIS — A4102 Sepsis due to Methicillin resistant Staphylococcus aureus: Secondary | ICD-10-CM | POA: Diagnosis not present

## 2014-06-25 DIAGNOSIS — L03031 Cellulitis of right toe: Secondary | ICD-10-CM

## 2014-06-25 DIAGNOSIS — E872 Acidosis: Secondary | ICD-10-CM | POA: Diagnosis not present

## 2014-06-25 LAB — CBC
HEMATOCRIT: 34.2 % — AB (ref 39.0–52.0)
Hemoglobin: 11.5 g/dL — ABNORMAL LOW (ref 13.0–17.0)
MCH: 29.9 pg (ref 26.0–34.0)
MCHC: 33.6 g/dL (ref 30.0–36.0)
MCV: 89.1 fL (ref 78.0–100.0)
PLATELETS: 359 10*3/uL (ref 150–400)
RBC: 3.84 MIL/uL — AB (ref 4.22–5.81)
RDW: 13.9 % (ref 11.5–15.5)
WBC: 17.2 10*3/uL — AB (ref 4.0–10.5)

## 2014-06-25 LAB — GLUCOSE, CAPILLARY
GLUCOSE-CAPILLARY: 122 mg/dL — AB (ref 70–99)
GLUCOSE-CAPILLARY: 178 mg/dL — AB (ref 70–99)
Glucose-Capillary: 127 mg/dL — ABNORMAL HIGH (ref 70–99)
Glucose-Capillary: 146 mg/dL — ABNORMAL HIGH (ref 70–99)
Glucose-Capillary: 150 mg/dL — ABNORMAL HIGH (ref 70–99)
Glucose-Capillary: 155 mg/dL — ABNORMAL HIGH (ref 70–99)

## 2014-06-25 LAB — PHOSPHORUS
PHOSPHORUS: 3.5 mg/dL (ref 2.3–4.6)
Phosphorus: 2.7 mg/dL (ref 2.3–4.6)

## 2014-06-25 LAB — COMPREHENSIVE METABOLIC PANEL
ALT: 12 U/L (ref 0–53)
AST: 14 U/L (ref 0–37)
Albumin: 1.4 g/dL — ABNORMAL LOW (ref 3.5–5.2)
Alkaline Phosphatase: 148 U/L — ABNORMAL HIGH (ref 39–117)
Anion gap: 4 — ABNORMAL LOW (ref 5–15)
CO2: 28 mmol/L (ref 19–32)
Calcium: 7.9 mg/dL — ABNORMAL LOW (ref 8.4–10.5)
Chloride: 103 mmol/L (ref 96–112)
Creatinine, Ser: 0.53 mg/dL (ref 0.50–1.35)
GFR calc Af Amer: >90 mL/min (ref >90)
GFR calc non Af Amer: >90 mL/min (ref >90)
Glucose, Bld: 138 mg/dL — ABNORMAL HIGH (ref 70–99)
POTASSIUM: 4.1 mmol/L (ref 3.5–5.1)
Sodium: 135 mmol/L (ref 135–145)
Total Bilirubin: 0.5 mg/dL (ref 0.3–1.2)
Total Protein: 5.3 g/dL — ABNORMAL LOW (ref 6.0–8.3)

## 2014-06-25 LAB — MAGNESIUM
MAGNESIUM: 1.9 mg/dL (ref 1.5–2.5)
Magnesium: 1.9 mg/dL (ref 1.5–2.5)

## 2014-06-25 MED ORDER — GABAPENTIN 250 MG/5ML PO SOLN
600.0000 mg | Freq: Three times a day (TID) | ORAL | Status: DC
  Administered 2014-06-25 – 2014-06-27 (×9): 600 mg
  Filled 2014-06-25 (×13): qty 12

## 2014-06-25 MED ORDER — PANTOPRAZOLE SODIUM 40 MG PO PACK
40.0000 mg | PACK | Freq: Every day | ORAL | Status: DC
  Administered 2014-06-25 – 2014-06-27 (×3): 40 mg
  Filled 2014-06-25 (×5): qty 20

## 2014-06-25 MED ORDER — CEFAZOLIN SODIUM-DEXTROSE 2-3 GM-% IV SOLR
2.0000 g | Freq: Three times a day (TID) | INTRAVENOUS | Status: AC
  Administered 2014-06-25 – 2014-07-04 (×29): 2 g via INTRAVENOUS
  Filled 2014-06-25 (×32): qty 50

## 2014-06-25 NOTE — Consult Note (Addendum)
PULMONARY / CRITICAL CARE MEDICINE   Name: Eric Shaw MRN: [id number] DOB: 1961/01/21    ADMISSION DATE:  06/19/2014 CONSULTATION DATE:  06/24/14  REFERRING MD :  Marzetta Merino  CHIEF COMPLAINT:  Nec fasc, post op resp failure  INITIAL PRESENTATION: 54 yr old male, presents post op neck debridement secondary to nec fasc  STUDIES:  2/19 CT neck - severe cellulitis involving the posterior soft tissues of the occiput and upper cervical spine. Edema extends into the trapezius and semispinalis capitis musculature. 2/19 CT neck - Progressive posterior cervical/occipital cellulitis and myositis There is a mix of phlegmon and abscess in the bilateral posterior cervical space and left paraspinal compartment, as above. Stable mild retropharyngeal edema.  SIGNIFICANT EVENTS: 2/20 OR, abscess drainage, findings of nec fasc, left on vent, airway difficult  SUBJECTIVE:  Sedated, not tachy  VITAL SIGNS: Temp:  [98 F (36.7 C)-99 F (37.2 C)] 98.3 F (36.8 C) (02/21 0740) Pulse Rate:  [73-83] 76 (02/21 0900) Resp:  [14-18] 17 (02/21 0900) BP: (93-129)/(59-78) 96/59 mmHg (02/21 0900) SpO2:  [98 %-100 %] 99 % (02/21 0900) FiO2 (%):  [30 %-40 %] 30 % (02/21 0807) Weight:  [100.9 kg (222 lb 7.1 oz)] 100.9 kg (222 lb 7.1 oz) (02/21 0500) HEMODYNAMICS: CVP:  [2 mmHg-5 mmHg] 2 mmHg VENTILATOR SETTINGS: Vent Mode:  [-] PRVC FiO2 (%):  [30 %-40 %] 30 % Set Rate:  [16 bmp] 16 bmp Vt Set:  [580 mL-600 mL] 580 mL PEEP:  [5 cmH20] 5 cmH20 Plateau Pressure:  [14 cmH20-15 cmH20] 14 cmH20 INTAKE / OUTPUT:  Intake/Output Summary (Last 24 hours) at 06/25/14 1047 Last data filed at 06/25/14 0900  Gross per 24 hour  Intake 3889.67 ml  Output   2635 ml  Net 1254.67 ml    PHYSICAL EXAMINATION: General:  Sedated deep Neuro:  rass -3 to -4 HEENT:  neck edema, wound open on back, not draining Cardiovascular:  s1 s2 RRR distant Lungs:  CTA, slight distant Abdomen:  Soft, BS hypo, nt, nd, no  r Musculoskeletal:  Old scar skin lowers , annular fibrotic  Skin:  As above  LABS:  CBC  Recent Labs Lab 06/23/14 0755 06/24/14 0626 06/25/14 0500  WBC 18.2* 16.8* 17.2*  HGB 12.7* 13.5 11.5*  HCT 36.1* 39.1 34.2*  PLT 355 396 359   Coag's No results for input(s): APTT, INR in the last 168 hours. BMET  Recent Labs Lab 06/23/14 0755 06/24/14 0626 06/25/14 0500  NA 131* 135 135  K 3.5 4.1 4.1  CL 99 99 103  CO2 BUN <5* <5* <5*  CREATININE 0.56 0.67 0.53  GLUCOSE 171* 117* 138*   Electrolytes  Recent Labs Lab 06/21/14 0753  06/23/14 0755 06/24/14 0626 06/24/14 1650 06/25/14 0259 06/25/14 0500  CALCIUM 8.0*  < > 7.8* 8.4  --   --  7.9*  MG 2.3  --   --   --  1.7 1.9  --   PHOS 3.1  --   --   --  2.8 3.5  --   < > = values in this interval not displayed. Sepsis Markers  Recent Labs Lab 06/19/14 1846 06/19/14 2118 06/24/14 1650  LATICACIDVEN 2.1* 1.6 1.6   ABG  Recent Labs Lab 06/24/14 1648  PHART 7.413  PCO2ART 41.2  PO2ART 118.0*   Liver Enzymes  Recent Labs Lab 06/19/14 1311 06/21/14 0753 06/25/14 0500  AST ALT ALKPHOS 178*  134* 148*  BILITOT 1.1 1.1 0.5  ALBUMIN 1.8* 1.7* 1.4*   Cardiac Enzymes No results for input(s): TROPONINI, PROBNP in the last 168 hours. Glucose  Recent Labs Lab 06/24/14 1014 06/24/14 1557 06/24/14 1915 06/25/14 0012 06/25/14 0343 06/25/14 0736  GLUCAP 119* 161* 152* 150* 127* 122*    Imaging Dg Chest Port 1 View  06/24/2014   CLINICAL DATA:  Initial evaluation for acute respiratory acidosis  EXAM: PORTABLE CHEST - 1 VIEW  COMPARISON:  06/20/2014  FINDINGS: Endotracheal tube identified with tip 2.5 cm above the carina. Left subclavian central line identified with tip over the anticipated position of the junction of the innominate vein and superior vena cava. No pneumothorax.  Heart size normal for technique. Vascular pattern normal. Lungs clear.  IMPRESSION: No acute  cardiopulmonary abnormality.  Lines and tubes as described.   Electronically Signed   By: Esperanza Heiraymond  Rubner M.D.   On: 06/24/2014 16:23   Dg Abd Portable 1v  06/25/2014   CLINICAL DATA:  Gastric tube  EXAM: PORTABLE ABDOMEN - 1 VIEW  FINDINGS: The nasogastric tube extends into the stomach with tip in the fundus.  IMPRESSION: Nasogastric tube reaches the stomach.   Electronically Signed   By: Ellery Plunkaniel R Mitchell M.D.   On: 06/25/2014 00:31     ASSESSMENT / PLAN:  PULMONARY OETT 2/20>>> A: Post op remained intubated, concern difficult airway, resp failure P:   Keep ACEI off ABg reviewed, keep same MV pcxr noted, no repeat in am needed Will need WUA and SBt consideration if tolerates WUA NO Extubation planned Goal PS 10 cpap 5 for 1 hr, no abg needed Allow even to pos balance  CARDIOVASCULAR CVL 2/20 (in or)>>> A: NO evidence severe sepsis, HTN at home, borderline BP P:  LA reassuring Pos to even balance goals Tele Fluids to 75 Dc tenormin  RENAL A:  sepsis P:   Chem in am  salien 75  GASTROINTESTINAL A:  Critical care nutrition needs P:   TF Follow for BM ppi  HEMATOLOGIC A:  DVT prevention, leukocytosis P:  Continue lovenox, crt in am  Cbc in am for wbc further   INFECTIOUS A: Nec Fasc, abscess neck P:   BCx2 >>> Neck Abscess 2/20>>>MSSA Abx: Ancef 2/20>>>  Remains without shock, LA wnl, dc clinda sens back as MSSA, keep ancef  ENDOCRINE A:  DM P:   feeds Cortisol if drops BP  NEUROLOGIC A:  Vent dyschrony, pain P:   RASS goal: -3 to -2 Low threshold dc abilify if fevers WUA mandatory  FAMILY  - Updates: no family present  - Inter-disciplinary family meet or Palliative Care meeting due by:  2.27.16   Ccm time 30 min   Mcarthur Rossettianiel J. Tyson AliasFeinstein, MD, FACP Pgr: [phone] [facility]  Pulmonary and Critical Care Medicine [facility] Pager: [phone]  06/25/2014, 10:47 AM

## 2014-06-26 ENCOUNTER — Encounter (HOSPITAL_COMMUNITY): Payer: Self-pay | Admitting: Otolaryngology

## 2014-06-26 ENCOUNTER — Encounter (HOSPITAL_COMMUNITY)
Admission: EM | Disposition: A | Discharge status: Home Health | Payer: Self-pay | Source: Home / Self Care | Attending: Internal Medicine

## 2014-06-26 ENCOUNTER — Inpatient Hospital Stay (HOSPITAL_COMMUNITY): Payer: MEDICAID | Admitting: Anesthesiology

## 2014-06-26 ENCOUNTER — Inpatient Hospital Stay (HOSPITAL_COMMUNITY): Payer: Self-pay | Admitting: Anesthesiology

## 2014-06-26 DIAGNOSIS — IMO0002 Reserved for concepts with insufficient information to code with codable children: Secondary | ICD-10-CM | POA: Insufficient documentation

## 2014-06-26 DIAGNOSIS — L03031 Cellulitis of right toe: Secondary | ICD-10-CM | POA: Diagnosis not present

## 2014-06-26 DIAGNOSIS — A4102 Sepsis due to Methicillin resistant Staphylococcus aureus: Secondary | ICD-10-CM | POA: Diagnosis not present

## 2014-06-26 DIAGNOSIS — E872 Acidosis: Secondary | ICD-10-CM | POA: Diagnosis not present

## 2014-06-26 HISTORY — PX: INCISION AND DRAINAGE ABSCESS: SHX5864

## 2014-06-26 LAB — MAGNESIUM
Magnesium: 1.7 mg/dL (ref 1.5–2.5)
Magnesium: 1.6 mg/dL (ref 1.5–2.5)

## 2014-06-26 LAB — CBC WITH DIFFERENTIAL/PLATELET
BASOS PCT: 0 % (ref 0–1)
Basophils Absolute: 0 10*3/uL (ref 0.0–0.1)
EOS PCT: 2 % (ref 0–5)
Eosinophils Absolute: 0.2 10*3/uL (ref 0.0–0.7)
HEMATOCRIT: 32.6 % — AB (ref 39.0–52.0)
Hemoglobin: 10.9 g/dL — ABNORMAL LOW (ref 13.0–17.0)
Lymphocytes Relative: 17 % (ref 12–46)
Lymphs Abs: 2.1 10*3/uL (ref 0.7–4.0)
MCH: 30.3 pg (ref 26.0–34.0)
MCHC: 33.4 g/dL (ref 30.0–36.0)
MCV: 90.6 fL (ref 78.0–100.0)
Monocytes Absolute: 0.9 10*3/uL (ref 0.1–1.0)
Monocytes Relative: 7 % (ref 3–12)
NEUTROS PCT: 74 % (ref 43–77)
Neutro Abs: 9 10*3/uL — ABNORMAL HIGH (ref 1.7–7.7)
PLATELETS: 382 10*3/uL (ref 150–400)
RBC: 3.6 MIL/uL — ABNORMAL LOW (ref 4.22–5.81)
RDW: 14.5 % (ref 11.5–15.5)
WBC: 12.3 10*3/uL — AB (ref 4.0–10.5)

## 2014-06-26 LAB — CULTURE, BLOOD (ROUTINE X 2)
CULTURE: NO GROWTH
CULTURE: NO GROWTH

## 2014-06-26 LAB — GLUCOSE, CAPILLARY
GLUCOSE-CAPILLARY: 91 mg/dL (ref 70–99)
Glucose-Capillary: 127 mg/dL — ABNORMAL HIGH (ref 70–99)
Glucose-Capillary: 127 mg/dL — ABNORMAL HIGH (ref 70–99)
Glucose-Capillary: 101 mg/dL — ABNORMAL HIGH (ref 70–99)
Glucose-Capillary: 84 mg/dL (ref 70–99)

## 2014-06-26 LAB — COMPREHENSIVE METABOLIC PANEL
ALT: 12 U/L (ref 0–53)
AST: 16 U/L (ref 0–37)
Albumin: 1.5 g/dL — ABNORMAL LOW (ref 3.5–5.2)
Alkaline Phosphatase: 125 U/L — ABNORMAL HIGH (ref 39–117)
Anion gap: 4 — ABNORMAL LOW (ref 5–15)
BILIRUBIN TOTAL: 0.4 mg/dL (ref 0.3–1.2)
BUN: 7 mg/dL (ref 6–23)
CO2: 29 mmol/L (ref 19–32)
CREATININE: 0.53 mg/dL (ref 0.50–1.35)
Calcium: 7.9 mg/dL — ABNORMAL LOW (ref 8.4–10.5)
Chloride: 104 mmol/L (ref 96–112)
GFR calc Af Amer: >90 mL/min (ref >90)
GFR calc non Af Amer: >90 mL/min (ref >90)
GLUCOSE: 112 mg/dL — AB (ref 70–99)
Potassium: 3.6 mmol/L (ref 3.5–5.1)
Sodium: 137 mmol/L (ref 135–145)
Total Protein: 5.4 g/dL — ABNORMAL LOW (ref 6.0–8.3)

## 2014-06-26 LAB — PHOSPHORUS
Phosphorus: 2.6 mg/dL (ref 2.3–4.6)
Phosphorus: 3 mg/dL (ref 2.3–4.6)

## 2014-06-26 SURGERY — INCISION AND DRAINAGE, ABSCESS
Anesthesia: General

## 2014-06-26 MED ORDER — LACTATED RINGERS IV SOLN
INTRAVENOUS | Status: DC | PRN
  Administered 2014-06-26 (×2): via INTRAVENOUS

## 2014-06-26 MED ORDER — ONDANSETRON HCL 4 MG/2ML IJ SOLN
INTRAMUSCULAR | Status: DC | PRN
Start: 2014-06-26 — End: 2014-06-26
  Administered 2014-06-26: 4 mg via INTRAVENOUS

## 2014-06-26 MED ORDER — MIDAZOLAM HCL 2 MG/2ML IJ SOLN
INTRAMUSCULAR | Status: AC
  Filled 2014-06-26: qty 2

## 2014-06-26 MED ORDER — VITAL HIGH PROTEIN PO LIQD
1000.0000 mL | ORAL | Status: DC
  Administered 2014-06-26 – 2014-06-27 (×2): 1000 mL
  Filled 2014-06-26 (×4): qty 1000

## 2014-06-26 MED ORDER — FENTANYL CITRATE 0.05 MG/ML IJ SOLN
INTRAMUSCULAR | Status: DC | PRN
  Administered 2014-06-26 (×5): 50 ug via INTRAVENOUS

## 2014-06-26 MED ORDER — PHENYLEPHRINE HCL 10 MG/ML IJ SOLN
INTRAMUSCULAR | Status: DC | PRN
  Administered 2014-06-26: 80 ug via INTRAVENOUS

## 2014-06-26 MED ORDER — MIDAZOLAM HCL 5 MG/5ML IJ SOLN
INTRAMUSCULAR | Status: DC | PRN
  Administered 2014-06-26: 2 mg via INTRAVENOUS

## 2014-06-26 MED ORDER — PRO-STAT SUGAR FREE PO LIQD
60.0000 mL | Freq: Three times a day (TID) | ORAL | Status: DC
  Administered 2014-06-26 – 2014-06-27 (×4): 60 mL
  Filled 2014-06-26 (×7): qty 60

## 2014-06-26 MED ORDER — LIDOCAINE-EPINEPHRINE 1 %-1:100000 IJ SOLN
INTRAMUSCULAR | Status: AC
  Filled 2014-06-26: qty 1

## 2014-06-26 MED ORDER — BACITRACIN ZINC 500 UNIT/GM EX OINT
TOPICAL_OINTMENT | CUTANEOUS | Status: AC
  Filled 2014-06-26: qty 28.35

## 2014-06-26 MED ORDER — ROCURONIUM BROMIDE 100 MG/10ML IV SOLN
INTRAVENOUS | Status: DC | PRN
  Administered 2014-06-26: 30 mg via INTRAVENOUS

## 2014-06-26 MED ORDER — 0.9 % SODIUM CHLORIDE (POUR BTL) OPTIME
TOPICAL | Status: DC | PRN
  Administered 2014-06-26: 1000 mL

## 2014-06-26 MED ORDER — FENTANYL CITRATE 0.05 MG/ML IJ SOLN
INTRAMUSCULAR | Status: AC
  Filled 2014-06-26: qty 5

## 2014-06-26 MED ORDER — SODIUM CHLORIDE 0.9 % IV SOLN
2500.0000 ug | INTRAVENOUS | Status: DC | PRN
  Administered 2014-06-26: 100 ug/h via INTRAVENOUS

## 2014-06-26 MED ORDER — PROPOFOL INFUSION 10 MG/ML OPTIME
INTRAVENOUS | Status: DC | PRN
  Administered 2014-06-26: 30 ug/kg/min via INTRAVENOUS

## 2014-06-26 SURGICAL SUPPLY — 49 items
BLADE SURG 10 STRL SS (BLADE) ×12 IMPLANT
BNDG CONFORM 2 STRL LF (GAUZE/BANDAGES/DRESSINGS) IMPLANT
BNDG GAUZE ELAST 4 BULKY (GAUZE/BANDAGES/DRESSINGS) ×3 IMPLANT
CANISTER SUCTION 2500CC (MISCELLANEOUS) IMPLANT
CATH ROBINSON RED A/P 16FR (CATHETERS) IMPLANT
CLEANER TIP ELECTROSURG 2X2 (MISCELLANEOUS) ×3 IMPLANT
CONT SPEC 4OZ CLIKSEAL STRL BL (MISCELLANEOUS) ×3 IMPLANT
CORDS BIPOLAR (ELECTRODE) IMPLANT
COVER SURGICAL LIGHT HANDLE (MISCELLANEOUS) ×3 IMPLANT
DRAIN PENROSE 1/4X12 LTX STRL (WOUND CARE) IMPLANT
DRSG EMULSION OIL 3X3 NADH (GAUZE/BANDAGES/DRESSINGS) IMPLANT
DRSG PAD ABDOMINAL 8X10 ST (GAUZE/BANDAGES/DRESSINGS) ×3 IMPLANT
ELECT COATED BLADE 2.86 ST (ELECTRODE) ×3 IMPLANT
ELECT NEEDLE TIP 2.8 STRL (NEEDLE) IMPLANT
ELECT REM PT RETURN 9FT ADLT (ELECTROSURGICAL) ×3
ELECTRODE REM PT RTRN 9FT ADLT (ELECTROSURGICAL) ×1 IMPLANT
FORCEPS BIPOLAR SPETZLER 8 1.0 (NEUROSURGERY SUPPLIES) IMPLANT
GAUZE PACKING IODOFORM 1/4X5 (PACKING) IMPLANT
GAUZE SPONGE 4X4 12PLY STRL (GAUZE/BANDAGES/DRESSINGS) IMPLANT
GAUZE SPONGE 4X4 16PLY XRAY LF (GAUZE/BANDAGES/DRESSINGS) ×3 IMPLANT
GLOVE BIOGEL PI IND STRL 6 (GLOVE) ×1 IMPLANT
GLOVE BIOGEL PI IND STRL 7.0 (GLOVE) ×1 IMPLANT
GLOVE BIOGEL PI INDICATOR 6 (GLOVE) ×2
GLOVE BIOGEL PI INDICATOR 7.0 (GLOVE) ×2
GLOVE ECLIPSE 7.5 STRL STRAW (GLOVE) ×3 IMPLANT
GOWN STRL REUS W/ TWL LRG LVL3 (GOWN DISPOSABLE) ×2 IMPLANT
GOWN STRL REUS W/TWL LRG LVL3 (GOWN DISPOSABLE) ×4
KIT BASIN OR (CUSTOM PROCEDURE TRAY) ×3 IMPLANT
KIT ROOM TURNOVER OR (KITS) ×3 IMPLANT
NEEDLE HYPO 30X.5 LL (NEEDLE) ×3 IMPLANT
NS IRRIG 1000ML POUR BTL (IV SOLUTION) ×3 IMPLANT
PAD ARMBOARD 7.5X6 YLW CONV (MISCELLANEOUS) ×6 IMPLANT
PENCIL FOOT CONTROL (ELECTRODE) ×3 IMPLANT
POUCH STERILIZING 3 X22 (STERILIZATION PRODUCTS) IMPLANT
SPONGE LAP 18X18 X RAY DECT (DISPOSABLE) ×6 IMPLANT
SUT CHROMIC 4 0 P 3 18 (SUTURE) IMPLANT
SUT ETHILON 4 0 PS 2 18 (SUTURE) IMPLANT
SUT ETHILON 5 0 P 3 18 (SUTURE)
SUT NYLON ETHILON 5-0 P-3 1X18 (SUTURE) IMPLANT
SUT SILK 4 0 REEL (SUTURE) IMPLANT
SWAB COLLECTION DEVICE MRSA (MISCELLANEOUS) IMPLANT
SYR BULB IRRIGATION 50ML (SYRINGE) IMPLANT
SYR CONTROL 10ML LL (SYRINGE) ×3 IMPLANT
TAPE CLOTH SURG 6X10 WHT LF (GAUZE/BANDAGES/DRESSINGS) ×3 IMPLANT
TOWEL OR 17X24 6PK STRL BLUE (TOWEL DISPOSABLE) ×3 IMPLANT
TOWEL OR 17X26 10 PK STRL BLUE (TOWEL DISPOSABLE) ×3 IMPLANT
TRAY ENT MC OR (CUSTOM PROCEDURE TRAY) ×3 IMPLANT
TUBE ANAEROBIC SPECIMEN COL (MISCELLANEOUS) IMPLANT
YANKAUER SUCT BULB TIP NO VENT (SUCTIONS) IMPLANT

## 2014-06-26 NOTE — Care Management (Signed)
Cancelled [facility] appt for 2/25 per NCM [name], RN.

## 2014-06-26 NOTE — Progress Notes (Signed)
INITIAL NUTRITION ASSESSMENT  DOCUMENTATION CODES Per approved criteria  -Obesity Unspecified   INTERVENTION:  Continue TF via OGT with Vital High Protein, decrease goal rate to 30 ml/h (720 ml per day) with Prostat 60 ml TID to provide 1320 kcals, 153 gm protein, 602 ml free water daily.  Above TF regimen plus current calories provided by Propofol will provide 1793 kcals per day (24 kcals/kg ideal weight), 153 gm protein (100% of estimated needs), 602 ml free water daily.  NUTRITION DIAGNOSIS: Inadequate oral intake related to inability to eat as evidenced by NPO status.   Goal: Enteral nutrition to provide 60-70% of estimated calorie needs (22-25 kcals/kg ideal body weight) and 100% of estimated protein needs, based on ASPEN guidelines for hypocaloric, high protein feeding in critically ill obese individuals.  Monitor:  TF tolerance/adequacy, weight trend, labs, vent status.  Reason for Assessment: MD Consult for TF initiation and management.  54 y.o. male  Admitting Dx: Necrotizing Fasciitis  ASSESSMENT: Patient presented post op neck debridement due to necrotizing fasciitis.  Nutrition focused physical exam completed.  No muscle or subcutaneous fat depletion noticed.  Patient has been receiving Vital High Protein via OGT at 40 ml/h (960 ml/day) with Prostat 30 ml BID to provide 1160 kcals, 114 gm protein, 803 ml free water daily. TF currently off for OR procedure scheduled for today.   Patient is currently intubated on ventilator support MV: 8.8 L/min Temp (24hrs), Avg:98.5 F (36.9 C), Min:97.8 F (36.6 C), Max:98.9 F (37.2 C)  Propofol: 17.9 ml/hr providing 473 kcals/day.   Height: Ht Readings from Last 1 Encounters:  06/19/14  (1.778 m)    Weight: Wt Readings from Last 1 Encounters:  06/26/14 225 lb 5 oz (102.2 kg)   Admission weight: 235 lbs (106.6 kg)  Ideal Body Weight: 75.5 kg  % Ideal Body Weight: 141%  Wt Readings from Last 10 Encounters:   06/26/14 225 lb 5 oz (102.2 kg)  06/19/14 235 lb (106.595 kg)  03/09/13 234 lb (106.142 kg)  12/25/12 225 lb (102.059 kg)    Usual Body Weight: 225-235 lbs  % Usual Body Weight: 100%  BMI:  Body mass index is 32.33 kg/(m^2).  Estimated Nutritional Needs: Kcal: 2079 Protein: 151-170 gm Fluid: 2.1 L  Skin: necrotizing fasciitis to neck  Diet Order: Diet NPO time specified  EDUCATION NEEDS: -Education not appropriate at this time   Intake/Output Summary (Last 24 hours) at 06/26/14 0926 Last data filed at 06/26/14 0800  Gross per 24 hour  Intake 3024.7 ml  Output   1525 ml  Net 1499.7 ml    Last BM: 2/17   Labs:   Recent Labs Lab 06/24/14 0626  06/25/14 0259 06/25/14 0500 06/25/14 1459 06/26/14 0259 06/26/14 0500  NA 135  --   --  135  --   --  137  K 4.1  --   --  4.1  --   --  3.6  CL 99  --   --  103  --   --  104  CO2 31  --   --  28  --   --  29  BUN <5*  --   --  <5*  --   --  7  CREATININE 0.67  --   --  0.53  --   --  0.53  CALCIUM 8.4  --   --  7.9*  --   --  7.9*  MG  --   < > 1.9  --  1.9 1.7  --   PHOS  --   < > 3.5  --  2.7 2.6  --   GLUCOSE 117*  --   --  138*  --   --  112*  < > = values in this interval not displayed.  CBG (last 3)   Recent Labs  06/26/14 06/26/14 0336 06/26/14 0812  GLUCAP 127* 127* 101*    Scheduled Meds: . albuterol  2.5 mg Nebulization Q4H  . antiseptic oral rinse  7 mL Mouth Rinse QID  . ARIPiprazole  5 mg Per Tube q morning - 10a  .  ceFAZolin (ANCEF) IV  2 g Intravenous 3 times per day  . chlorhexidine  15 mL Mouth Rinse BID  . Chlorhexidine Gluconate Cloth  6 each Topical Daily  . enoxaparin (LOVENOX) injection  40 mg Subcutaneous Q24H  . feeding supplement (PRO-STAT SUGAR FREE 64)  30 mL Per Tube BID  . feeding supplement (VITAL HIGH PROTEIN)  1,000 mL Per Tube Q24H  . fentaNYL  50 mcg Intravenous Once  . gabapentin  600 mg Per Tube TID  . insulin aspart  0-20 Units Subcutaneous Q4H  . mupirocin  ointment  1 application Nasal BID  . pantoprazole sodium  40 mg Per Tube Daily    Continuous Infusions: . sodium chloride 75 mL/hr at 06/25/14 1212  . fentaNYL infusion INTRAVENOUS 100 mcg/hr (06/25/14 2127)  . propofol 30 mcg/kg/min (06/26/14 0645)    Past Medical History  Diagnosis Date  . Plantar fasciitis, bilateral   . Hypertension   . High cholesterol   . Type II diabetes mellitus   . Sleep apnea     "not suppose to wear mask" (06/19/2014)  . Arthritis     "all 10 toes" (06/19/2014)  . Depression     Past Surgical History  Procedure Laterality Date  . Reduction of torsion of testis Left 1979    Joaquin CourtsKimberly Harris, RD, LDN, CNSC Pager [phone] After Hours Pager [phone]

## 2014-06-26 NOTE — Transfer of Care (Signed)
Immediate Anesthesia Transfer of Care Note  Patient: Eric Shaw  Procedure(s) Performed: Procedure(s): REPEAT DEBRIEDMENT OF NECK  WOUND (N/A)  Patient Location: ICU  Anesthesia Type:General  Level of Consciousness: Patient remains intubated per anesthesia plan  Airway & Oxygen Therapy: Patient remains intubated per anesthesia plan  Post-op Assessment: Report given to RN and Post -op Vital signs reviewed and stable  Post vital signs: Reviewed and stable  Last Vitals:  Filed Vitals:   06/26/14 0900  BP: 116/63  Pulse: 89  Temp:   Resp: 16    Complications: No apparent anesthesia complications

## 2014-06-26 NOTE — Op Note (Signed)
OPERATIVE REPORT  DATE OF SURGERY: 06/26/2014  PATIENT:  Eric CraneJon Holtman,  54 y.o. male  PRE-OPERATIVE DIAGNOSIS:  debriedment of dead tissue  POST-OPERATIVE DIAGNOSIS:  debriedment of dead tissue  PROCEDURE:  Procedure(s): REPEAT DEBRIEDMENT OF NECK  WOUND  SURGEON:  Susy FrizzleJefry H Larri Yehle, MD  ASSISTANTS: None  ANESTHESIA:   General   EBL:  30 ml  DRAINS: None  LOCAL MEDICATIONS USED:  None  SPECIMEN:  none  COUNTS:  Correct  PROCEDURE DETAILS: The patient was taken to the operating room and placed on the operating table in the supine position. Following induction of endotracheal anesthesia the patient was turned over to the prone position. The posterior neck was prepped and draped in a standard fashion. The previous packing was removed. The wound was inspected. Self-retaining retractors were used for visualization. There is small pockets of necrotic tissue that were debrided with 10 scalpel. No specimen was sent for pathologic evaluation this time. All the necrotic areas were debrided. There is much less necrosis then had been present 2 days ago. There is some viable tissue underlying all areas. The wound was irrigated with saline. Curlex saline soaked packing was placed, 2 large ones. Dressing was applied. Patient remained intubated and sedated and was transferred back to intensive care in satisfactory but critical condition. We will return to the operating room Wednesday for additional debridement. There was significant improvement compared to the prior examination. Most of the skin appears to be viable.    PATIENT DISPOSITION:  To PACU, stable

## 2014-06-26 NOTE — Progress Notes (Signed)
PULMONARY / CRITICAL CARE MEDICINE   Name: Eric Shaw MRN: [id number] DOB: 04-Dec-1960    ADMISSION DATE:  06/19/2014 CONSULTATION DATE:  06/24/14  REFERRING MD :  Marzetta Merino  CHIEF COMPLAINT:  Nec fasc, post op resp failure  INITIAL PRESENTATION: 54 yr old male, presents post op neck debridement secondary to nec fasc  STUDIES:  2/19 CT neck - severe cellulitis involving the posterior soft tissues of the occiput and upper cervical spine. Edema extends into the trapezius and semispinalis capitis musculature. 2/19 CT neck - Progressive posterior cervical/occipital cellulitis and myositis There is a mix of phlegmon and abscess in the bilateral posterior cervical space and left paraspinal compartment, as above. Stable mild retropharyngeal edema.  SIGNIFICANT EVENTS: 2/20 OR, abscess drainage, findings of nec fasc, left on vent, airway difficult  SUBJECTIVE:  Follows commands, reamins vented  VITAL SIGNS: Temp:  [97.8 F (36.6 C)-98.9 F (37.2 C)] 98.5 F (36.9 C) (02/22 0817) Pulse Rate:  [72-89] 89 (02/22 0900) Resp:  [16] 16 (02/22 0900) BP: (91-125)/(56-73) 116/63 mmHg (02/22 0900) SpO2:  [94 %-100 %] 98 % (02/22 0900) FiO2 (%):  [30 %] 30 % (02/22 0835) Weight:  [102.2 kg (225 lb 5 oz)] 102.2 kg (225 lb 5 oz) (02/22 0427) HEMODYNAMICS: CVP:  [3 mmHg-4 mmHg] 3 mmHg VENTILATOR SETTINGS: Vent Mode:  [-] PRVC FiO2 (%):  [30 %] 30 % Set Rate:  [16 bmp] 16 bmp Vt Set:  [580 mL] 580 mL PEEP:  [5 cmH20] 5 cmH20 Plateau Pressure:  [13 cmH20-18 cmH20] 16 cmH20 INTAKE / OUTPUT:  Intake/Output Summary (Last 24 hours) at 06/26/14 1024 Last data filed at 06/26/14 0800  Gross per 24 hour  Intake 2984.7 ml  Output   1525 ml  Net 1459.7 ml    PHYSICAL EXAMINATION: General:  Sedated deep Neuro:  rass -1, follow commands well HEENT:  neck edema, wound open on back neck, dressed dry Cardiovascular:  s1 s2 RRR distant Lungs:  CTA Abdomen:  Soft, BS wnl, nt, nd, no  r Musculoskeletal:  Old scar skin lowers , annular fibrotic  Skin:  As above  LABS:  CBC  Recent Labs Lab 06/24/14 0626 06/25/14 0500 06/26/14 0500  WBC 16.8* 17.2* 12.3*  HGB 13.5 11.5* 10.9*  HCT 39.1 34.2* 32.6*  PLT 396 359 382   Coag's No results for input(s): APTT, INR in the last 168 hours. BMET  Recent Labs Lab 06/24/14 0626 06/25/14 0500 06/26/14 0500  NA 135 135 137  K 4.1 4.1 3.6  CL 99 103 104  CO2 BUN <5* <5* 7  CREATININE 0.67 0.53 0.53  GLUCOSE 117* 138* 112*   Electrolytes  Recent Labs Lab 06/24/14 0626  06/25/14 0259 06/25/14 0500 06/25/14 1459 06/26/14 0259 06/26/14 0500  CALCIUM 8.4  --   --  7.9*  --   --  7.9*  MG  --   < > 1.9  --  1.9 1.7  --   PHOS  --   < > 3.5  --  2.7 2.6  --   < > = values in this interval not displayed. Sepsis Markers  Recent Labs Lab 06/19/14 1846 06/19/14 2118 06/24/14 1650  LATICACIDVEN 2.1* 1.6 1.6   ABG  Recent Labs Lab 06/24/14 1648  PHART 7.413  PCO2ART 41.2  PO2ART 118.0*   Liver Enzymes  Recent Labs Lab 06/21/14 0753 06/25/14 0500 06/26/14 0500  AST ALT ALKPHOS 134*  148* 125*  BILITOT 1.1 0.5 0.4  ALBUMIN 1.7* 1.4* 1.5*   Cardiac Enzymes No results for input(s): TROPONINI, PROBNP in the last 168 hours. Glucose  Recent Labs Lab 06/25/14 1133 06/25/14 1534 06/25/14 1919 06/26/14 06/26/14 0336 06/26/14 0812  GLUCAP 178* 146* 155* 127* 127* 101*    Imaging Dg Chest Port 1 View  06/25/2014   CLINICAL DATA:  Acute respiratory acidosis, history type 2 diabetes, hypertension  EXAM: PORTABLE CHEST - 1 VIEW  COMPARISON:  Portable exam 0657 hr compared to 06/24/2014  FINDINGS: Tip of endotracheal tube projects approximately 4.5 cm above carina.  Nasogastric tube extends into stomach.  LEFT subclavian central venous catheter tip projecting over SVC.  Upper normal heart size.  No mediastinal contours and pulmonary vascularity.  Minimal atelectasis at  RIGHT base.  Lungs otherwise clear.  No pleural effusion or pneumothorax.  IMPRESSION: Minimal chronic persistent RIGHT basilar atelectasis.   Electronically Signed   By: Ulyses SouthwardMark  Boles M.D.   On: 06/25/2014 07:26     ASSESSMENT / PLAN:  PULMONARY OETT 2/20>>> A: Post op remained intubated, concern difficult airway, resp failure P:   Keep ACEI off with mucosa described Even balance goals Wean cpap 5 ps 5, goal 1 hr, assess rsbi, will discuss with ENT consideration extubation in OR if determined to be ok to extubate Will assess mechanics which likely are fine, normal lung fields, good neurostatus pcxr in am  Leak test   CARDIOVASCULAR CVL 2/20 (in or)>>> A: NO evidence severe sepsis, HTN at home, borderline BP P:  Even balance Tele Fluids to 75 ot kvo Keep holding tenormin  RENAL A:  Sepsis resolving P:   Chem in am  salien 75 to kvo  GASTROINTESTINAL A:  Critical care nutrition needs P:   TF off for OR Follow for BM ppi  HEMATOLOGIC A:  DVT prevention, leukocytosis P:  Continue lovenox post op Limit phlebtomomy  INFECTIOUS A: Nec Fasc, abscess neck, MSSA P:   BCx2 >>> Neck Abscess 2/20>>>MSSA Abx: Ancef 2/20>>>  Will need minimum 14 days  ENDOCRINE A:  DM P:   Feeds on hold ssi  NEUROLOGIC A:  Vent dyschrony, pain P:   RASS goal: -1 Low threshold dc abilify if fevers WUA mandatory  FAMILY  - Updates: no family present  - Inter-disciplinary family meet or Palliative Care meeting due by:  2.27.16   Ccm time 30 min   Mcarthur Rossettianiel J. Tyson AliasFeinstein, MD, FACP Pgr: [phone] [facility]  Pulmonary and Critical Care Medicine [facility] Pager: [phone]  06/26/2014, 10:24 AM

## 2014-06-26 NOTE — Anesthesia Preprocedure Evaluation (Addendum)
Anesthesia Evaluation   Patient unresponsive    Airway        Dental   Pulmonary sleep apnea , Current Smoker,  Pt intubated in ICU r/t infection         Cardiovascular hypertension,     Neuro/Psych    GI/Hepatic   Endo/Other  diabetes  Renal/GU      Musculoskeletal  (+) Arthritis -,   Abdominal   Peds  Hematology   Anesthesia Other Findings   Reproductive/Obstetrics                            Anesthesia Physical Anesthesia Plan  ASA: III  Anesthesia Plan:    Post-op Pain Management:    Induction:   Airway Management Planned:   Additional Equipment:   Intra-op Plan:   Post-operative Plan:   Informed Consent:   Plan Discussed with:   Anesthesia Plan Comments:         Anesthesia Quick Evaluation

## 2014-06-26 NOTE — Anesthesia Postprocedure Evaluation (Signed)
  Anesthesia Post-op Note  Patient: Eric Shaw  Procedure(s) Performed: Procedure(s): REPEAT DEBRIEDMENT OF NECK  WOUND (N/A)  Patient Location: ICU  Anesthesia Type:General  Level of Consciousness: sedated and Patient remains intubated per anesthesia plan  Airway and Oxygen Therapy: Patient remains intubated per anesthesia plan and Patient placed on Ventilator (see vital sign flow sheet for setting)  Post-op Pain: none  Post-op Assessment: Post-op Vital signs reviewed, Patient's Cardiovascular Status Stable, Respiratory Function Stable and Patent Airway  Post-op Vital Signs: stable  Last Vitals:  Filed Vitals:   06/26/14 1800  BP: 108/67  Pulse: 84  Temp:   Resp: 16    Complications: No apparent anesthesia complications

## 2014-06-27 ENCOUNTER — Inpatient Hospital Stay (HOSPITAL_COMMUNITY): Payer: Self-pay

## 2014-06-27 ENCOUNTER — Encounter (HOSPITAL_COMMUNITY): Payer: Self-pay | Admitting: Otolaryngology

## 2014-06-27 ENCOUNTER — Ambulatory Visit: Payer: Self-pay | Admitting: Otolaryngology

## 2014-06-27 DIAGNOSIS — L03221 Cellulitis of neck: Secondary | ICD-10-CM | POA: Diagnosis not present

## 2014-06-27 DIAGNOSIS — E872 Acidosis: Secondary | ICD-10-CM | POA: Diagnosis not present

## 2014-06-27 DIAGNOSIS — L0211 Cutaneous abscess of neck: Secondary | ICD-10-CM | POA: Diagnosis not present

## 2014-06-27 LAB — GLUCOSE, CAPILLARY
GLUCOSE-CAPILLARY: 110 mg/dL — AB (ref 70–99)
GLUCOSE-CAPILLARY: 138 mg/dL — AB (ref 70–99)
GLUCOSE-CAPILLARY: 155 mg/dL — AB (ref 70–99)
Glucose-Capillary: 118 mg/dL — ABNORMAL HIGH (ref 70–99)
Glucose-Capillary: 141 mg/dL — ABNORMAL HIGH (ref 70–99)
Glucose-Capillary: 185 mg/dL — ABNORMAL HIGH (ref 70–99)

## 2014-06-27 LAB — CULTURE, ROUTINE-ABSCESS

## 2014-06-27 LAB — BASIC METABOLIC PANEL
Anion gap: 3 — ABNORMAL LOW (ref 5–15)
BUN: 7 mg/dL (ref 6–23)
CALCIUM: 8 mg/dL — AB (ref 8.4–10.5)
CO2: 31 mmol/L (ref 19–32)
CREATININE: 0.54 mg/dL (ref 0.50–1.35)
Chloride: 103 mmol/L (ref 96–112)
GFR calc Af Amer: >90 mL/min (ref >90)
GLUCOSE: 115 mg/dL — AB (ref 70–99)
POTASSIUM: 3.6 mmol/L (ref 3.5–5.1)
Sodium: 137 mmol/L (ref 135–145)

## 2014-06-27 LAB — CBC
HCT: 31.8 % — ABNORMAL LOW (ref 39.0–52.0)
Hemoglobin: 10.5 g/dL — ABNORMAL LOW (ref 13.0–17.0)
MCH: 30.6 pg (ref 26.0–34.0)
MCHC: 33 g/dL (ref 30.0–36.0)
MCV: 92.7 fL (ref 78.0–100.0)
PLATELETS: 391 10*3/uL (ref 150–400)
RBC: 3.43 MIL/uL — AB (ref 4.22–5.81)
RDW: 14.6 % (ref 11.5–15.5)
WBC: 11.4 10*3/uL — AB (ref 4.0–10.5)

## 2014-06-27 LAB — MAGNESIUM: Magnesium: 1.6 mg/dL (ref 1.5–2.5)

## 2014-06-27 LAB — PHOSPHORUS: Phosphorus: 3.6 mg/dL (ref 2.3–4.6)

## 2014-06-27 MED ORDER — POTASSIUM CHLORIDE 20 MEQ/15ML (10%) PO SOLN
40.0000 meq | Freq: Once | ORAL | Status: AC
  Administered 2014-06-27: 40 meq via ORAL
  Filled 2014-06-27 (×2): qty 30

## 2014-06-27 MED ORDER — DOCUSATE SODIUM 100 MG PO CAPS
100.0000 mg | ORAL_CAPSULE | Freq: Two times a day (BID) | ORAL | Status: DC
  Filled 2014-06-27 (×2): qty 1

## 2014-06-27 MED ORDER — MAGNESIUM SULFATE 2 GM/50ML IV SOLN
2.0000 g | Freq: Once | INTRAVENOUS | Status: AC
  Administered 2014-06-27: 2 g via INTRAVENOUS
  Filled 2014-06-27 (×2): qty 50

## 2014-06-27 MED ORDER — FUROSEMIDE 10 MG/ML IJ SOLN
20.0000 mg | Freq: Two times a day (BID) | INTRAMUSCULAR | Status: DC
  Administered 2014-06-27 – 2014-06-28 (×2): 20 mg via INTRAVENOUS
  Filled 2014-06-27 (×4): qty 2

## 2014-06-27 MED ORDER — SENNOSIDES-DOCUSATE SODIUM 8.6-50 MG PO TABS
1.0000 | ORAL_TABLET | Freq: Two times a day (BID) | ORAL | Status: DC
  Administered 2014-06-27 – 2014-07-06 (×12): 1 via ORAL
  Filled 2014-06-27 (×17): qty 1

## 2014-06-27 NOTE — Progress Notes (Signed)
[facility] FOR [facility] LAB REPLACEMENT ONLY  The patient does not apply for the [facility] Electroly[facility] Protocol based on the criteria listed below:    Is urine output >/= 0.5 ml/kg/hr for the last 6 hours? No. Patient's UOP is 0.3 ml/kg/hr   Abnormal electrolyte(s): K3.6   If a panic level lab has been reported, has the CCM MD in charge been notified? Yes   Physician:  E Deterding,MD  Melrose NakayamaChisholm, Nainika Newlun William 06/27/2014 5:48 AM

## 2014-06-27 NOTE — Progress Notes (Signed)
PULMONARY / CRITICAL CARE MEDICINE   Name: Eric Shaw MRN: [id number] DOB: [birth year]    ADMISSION DATE:  06/19/2014 CONSULTATION DATE:  06/24/14  REFERRING MD :  Marzetta Merinoriad, Rosen  CHIEF COMPLAINT:  Nec fasc, post op resp failure  INITIAL PRESENTATION: 54 yr old male, presents post op neck debridement secondary to nec fasc  STUDIES:  2/19 CT neck - severe cellulitis involving the posterior soft tissues of the occiput and upper cervical spine. Edema extends into the trapezius and semispinalis capitis musculature. 2/19 CT neck - Progressive posterior cervical/occipital cellulitis and myositis There is a mix of phlegmon and abscess in the bilateral posterior cervical space and left paraspinal compartment, as above. Stable mild retropharyngeal edema.  SIGNIFICANT EVENTS: 2/20 OR, abscess drainage, findings of nec fasc, left on vent, airway difficult 2/22 pos balance 2/23- int prom on pcxr  SUBJECTIVE:  WBC continues to drop RASS -2 S/p debridement on 2/22 Propofol 30 and fentanyl 100  VITAL SIGNS: Temp:  [98.5 F (36.9 C)-99.2 F (37.3 C)] 99.1 F (37.3 C) (02/23 0400) Pulse Rate:  [76-94] 88 (02/23 0530) Resp:  [5-21] 16 (02/23 0530) BP: (98-125)/(56-73) 112/66 mmHg (02/23 0530) SpO2:  [98 %-100 %] 99 % (02/23 0530) FiO2 (%):  [30 %] 30 % (02/23 0300) Weight:  [223 lb 1.7 oz (101.2 kg)] 223 lb 1.7 oz (101.2 kg) (02/23 0413) HEMODYNAMICS: CVP:  [3 mmHg-4 mmHg] 4 mmHg VENTILATOR SETTINGS: Vent Mode:  [-] PRVC FiO2 (%):  [30 %] 30 % Set Rate:  [16 bmp] 16 bmp Vt Set:  [580 mL] 580 mL PEEP:  [5 cmH20] 5 cmH20 Plateau Pressure:  [15 cmH20-17 cmH20] 15 cmH20 INTAKE / OUTPUT:  Intake/Output Summary (Last 24 hours) at 06/27/14 0644 Last data filed at 06/27/14 0500  Gross per 24 hour  Intake 2576.54 ml  Output   1590 ml  Net 986.54 ml    PHYSICAL EXAMINATION: General:  Sedated Neuro:  rass -2, does not follow commands HEENT:  Left neck wound dressing is clean, dry  and intact Cardiovascular:  s1 s2 RRR distant Lungs:  CTA bilaterally Abdomen:  Soft, Hypoactive bowel sounds, no apparent tenderness Musculoskeletal:  Old scar skin lowers , annular fibrotic, no edema  Skin:  No rashes  LABS:  CBC  Recent Labs Lab 06/25/14 0500 06/26/14 0500 06/27/14 0400  WBC 17.2* 12.3* 11.4*  HGB 11.5* 10.9* 10.5*  HCT 34.2* 32.6* 31.8*  PLT 359 382 391   Coag's No results for input(s): APTT, INR in the last 168 hours. BMET  Recent Labs Lab 06/25/14 0500 06/26/14 0500 06/27/14 0400  NA 135 137 137  K 4.1 3.6 3.6  CL 103 104 103  CO2 28 29 31   BUN <5* 7 7  CREATININE 0.53 0.53 0.54  GLUCOSE 138* 112* 115*   Electrolytes  Recent Labs Lab 06/25/14 0500  06/26/14 0259 06/26/14 0500 06/26/14 1635 06/27/14 0250 06/27/14 0400  CALCIUM 7.9*  --   --  7.9*  --   --  8.0*  MG  --   < > 1.7  --  1.6 1.6  --   PHOS  --   < > 2.6  --  3.0 3.6  --   < > = values in this interval not displayed. Sepsis Markers  Recent Labs Lab 06/24/14 1650  LATICACIDVEN 1.6   ABG  Recent Labs Lab 06/24/14 1648  PHART 7.413  PCO2ART 41.2  PO2ART 118.0*   Liver Enzymes  Recent Labs Lab 06/21/14 0753 06/25/14  0500 06/26/14 0500  AST ALT ALKPHOS 134* 148* 125*  BILITOT 1.1 0.5 0.4  ALBUMIN 1.7* 1.4* 1.5*   Cardiac Enzymes No results for input(s): TROPONINI, PROBNP in the last 168 hours. Glucose  Recent Labs Lab 06/26/14 0336 06/26/14 0812 06/26/14 1651 06/26/14 1909 06/27/14 0031 06/27/14 0401  GLUCAP 127* 101* 91 84 110* 118*    Imaging No results found.   ASSESSMENT / PLAN:  PULMONARY OETT 2/20>>> A: Post op remained intubated, concern difficult airway, resp failure, increased edema P:   Keep ACEI off with mucosa described Neg balance, repeat film with int prom worsening with pos baalnce Wean cpap 5 ps 5, goal 1 hr, unable as too sedated, repeat after WUA further pcxr in am  CARDIOVASCULAR CVL 2/20  (in or)>>> A: NO evidence severe sepsis, HTN at home, borderline BP P:  Even balance Tele Fluids at kvo Keep holding tenormin Lasix start  RENAL A:  Sepsis resolving, some pulm edema P:   Chem in am  Saline at kvo lasix  GASTROINTESTINAL A:  Critical care nutrition needs Protein-calorie malnutrition P:   Senokot-s BID Follow for BM ppi NPO in am for procedure  HEMATOLOGIC A:  DVT prevention, leukocytosis (improving) P:  Continue lovenox post op Limit phlebtomomy  INFECTIOUS A: Nec Fasc, abscess neck, MSSA; P:   BCx2 2/15 >>> No growth Neck Abscess 2/20>>>MSSA Abx: Ancef 2/20>>> Will need minimum 14 days  ensure stop date in place  ENDOCRINE A:  DM P:   ssi  NEUROLOGIC A:  Vent dyschrony, pain P:   RASS goal: -1 Wean propofol today as oversedated Fentanyl gtt WUA mandatory  FAMILY  - Updates: no family present  - Inter-disciplinary family meet or Palliative Care meeting due by:  2.27.16  Jacquelin Hawking, MD PGY-2, [facility] 06/27/2014, 6:46 AM   STAFF NOTE: Cindi Carbon, MD FACP have personally reviewed patient's available data, including medical history, events of note, physical examination and test results as part of my evaluation. I have discussed with resident/NP and other care providers such as pharmacist, RN and RRT. In addition, I personally evaluated patient and elicited key findings of: oversedated with prop for weaing, reduce prop then wean cpap 5 ps 5, goal 2 hr, to OR again in am , no extubation planned, lasix started, kvo, MSSA stop date to add The patient is critically ill with multiple organ systems failure and requires high complexity decision making for assessment and support, frequent evaluation and titration of therapies, application of advanced monitoring technologies and extensive interpretation of multiple databases.   Critical Care Time devoted to patient care services described in this note is30   Minutes. This time reflects time of care of this signee: Rory Percy, MD FACP. This critical care time does not reflect procedure time, or teaching time or supervisory time of PA/NP/Med student/Med Resident etc but could involve care discussion time. Rest per NP/medical resident whose note is outlined above and that I agree with   Mcarthur Rossetti. Tyson Alias, MD, FACP Pgr: [phone] [facility] 06/27/2014 12:25 PM

## 2014-06-28 ENCOUNTER — Inpatient Hospital Stay (HOSPITAL_COMMUNITY): Payer: Self-pay

## 2014-06-28 ENCOUNTER — Inpatient Hospital Stay (HOSPITAL_COMMUNITY): Payer: Self-pay | Admitting: Critical Care Medicine

## 2014-06-28 ENCOUNTER — Encounter (HOSPITAL_COMMUNITY): Payer: Self-pay | Admitting: Critical Care Medicine

## 2014-06-28 ENCOUNTER — Encounter (HOSPITAL_COMMUNITY)
Admission: EM | Disposition: A | Discharge status: Home Health | Payer: Self-pay | Source: Home / Self Care | Attending: Internal Medicine

## 2014-06-28 ENCOUNTER — Inpatient Hospital Stay (HOSPITAL_COMMUNITY): Payer: MEDICAID | Admitting: Critical Care Medicine

## 2014-06-28 DIAGNOSIS — L0291 Cutaneous abscess, unspecified: Secondary | ICD-10-CM | POA: Diagnosis not present

## 2014-06-28 DIAGNOSIS — E872 Acidosis: Secondary | ICD-10-CM | POA: Diagnosis not present

## 2014-06-28 DIAGNOSIS — L0390 Cellulitis, unspecified: Secondary | ICD-10-CM | POA: Diagnosis not present

## 2014-06-28 DIAGNOSIS — L03221 Cellulitis of neck: Secondary | ICD-10-CM | POA: Diagnosis not present

## 2014-06-28 HISTORY — PX: INCISION AND DRAINAGE ABSCESS: SHX5864

## 2014-06-28 LAB — GLUCOSE, CAPILLARY
GLUCOSE-CAPILLARY: 142 mg/dL — AB (ref 70–99)
GLUCOSE-CAPILLARY: 149 mg/dL — AB (ref 70–99)
GLUCOSE-CAPILLARY: 154 mg/dL — AB (ref 70–99)
Glucose-Capillary: 103 mg/dL — ABNORMAL HIGH (ref 70–99)
Glucose-Capillary: 140 mg/dL — ABNORMAL HIGH (ref 70–99)
Glucose-Capillary: 147 mg/dL — ABNORMAL HIGH (ref 70–99)
Glucose-Capillary: 155 mg/dL — ABNORMAL HIGH (ref 70–99)

## 2014-06-28 LAB — BASIC METABOLIC PANEL
ANION GAP: 10 (ref 5–15)
BUN: 13 mg/dL (ref 6–23)
CHLORIDE: 96 mmol/L (ref 96–112)
CO2: 30 mmol/L (ref 19–32)
Calcium: 8.3 mg/dL — ABNORMAL LOW (ref 8.4–10.5)
Creatinine, Ser: 0.51 mg/dL (ref 0.50–1.35)
GFR calc Af Amer: >90 mL/min (ref >90)
GLUCOSE: 145 mg/dL — AB (ref 70–99)
POTASSIUM: 3.7 mmol/L (ref 3.5–5.1)
SODIUM: 136 mmol/L (ref 135–145)

## 2014-06-28 LAB — CBC
HCT: 31.7 % — ABNORMAL LOW (ref 39.0–52.0)
HEMOGLOBIN: 10.3 g/dL — AB (ref 13.0–17.0)
MCH: 29.8 pg (ref 26.0–34.0)
MCHC: 32.5 g/dL (ref 30.0–36.0)
MCV: 91.6 fL (ref 78.0–100.0)
Platelets: 391 10*3/uL (ref 150–400)
RBC: 3.46 MIL/uL — ABNORMAL LOW (ref 4.22–5.81)
RDW: 14.4 % (ref 11.5–15.5)
WBC: 10.8 10*3/uL — ABNORMAL HIGH (ref 4.0–10.5)

## 2014-06-28 LAB — PHOSPHORUS: Phosphorus: 3.1 mg/dL (ref 2.3–4.6)

## 2014-06-28 LAB — MAGNESIUM: MAGNESIUM: 1.9 mg/dL (ref 1.5–2.5)

## 2014-06-28 SURGERY — INCISION AND DRAINAGE, ABSCESS
Anesthesia: General | Site: Neck

## 2014-06-28 MED ORDER — LIDOCAINE HCL (CARDIAC) 20 MG/ML IV SOLN
INTRAVENOUS | Status: AC
  Filled 2014-06-28: qty 5

## 2014-06-28 MED ORDER — ONDANSETRON HCL 4 MG/2ML IJ SOLN
INTRAMUSCULAR | Status: AC
  Filled 2014-06-28: qty 2

## 2014-06-28 MED ORDER — ARTIFICIAL TEARS OP OINT
TOPICAL_OINTMENT | OPHTHALMIC | Status: AC
  Filled 2014-06-28: qty 3.5

## 2014-06-28 MED ORDER — ROCURONIUM BROMIDE 50 MG/5ML IV SOLN
INTRAVENOUS | Status: AC
  Filled 2014-06-28: qty 1

## 2014-06-28 MED ORDER — LACTATED RINGERS IV SOLN
INTRAVENOUS | Status: DC | PRN
  Administered 2014-06-28: 09:00:00 via INTRAVENOUS

## 2014-06-28 MED ORDER — PROPOFOL 10 MG/ML IV BOLUS
INTRAVENOUS | Status: AC
  Filled 2014-06-28: qty 20

## 2014-06-28 MED ORDER — GABAPENTIN 300 MG PO CAPS
600.0000 mg | ORAL_CAPSULE | Freq: Three times a day (TID) | ORAL | Status: DC
  Administered 2014-06-29 – 2014-07-06 (×21): 600 mg via ORAL
  Filled 2014-06-28 (×26): qty 2

## 2014-06-28 MED ORDER — ONDANSETRON HCL 4 MG/2ML IJ SOLN: 4.0000 mg | Freq: Once | INTRAMUSCULAR | Status: DC | PRN

## 2014-06-28 MED ORDER — FENTANYL CITRATE 0.05 MG/ML IJ SOLN
INTRAMUSCULAR | Status: AC
  Filled 2014-06-28: qty 5

## 2014-06-28 MED ORDER — SUCCINYLCHOLINE CHLORIDE 20 MG/ML IJ SOLN
INTRAMUSCULAR | Status: AC
  Filled 2014-06-28: qty 1

## 2014-06-28 MED ORDER — FUROSEMIDE 10 MG/ML IJ SOLN
20.0000 mg | Freq: Two times a day (BID) | INTRAMUSCULAR | Status: AC
  Administered 2014-06-28 – 2014-06-29 (×2): 20 mg via INTRAVENOUS

## 2014-06-28 MED ORDER — MIDAZOLAM HCL 2 MG/2ML IJ SOLN
INTRAMUSCULAR | Status: AC
  Filled 2014-06-28: qty 2

## 2014-06-28 MED ORDER — 0.9 % SODIUM CHLORIDE (POUR BTL) OPTIME
TOPICAL | Status: DC | PRN
  Administered 2014-06-28: 1000 mL

## 2014-06-28 MED ORDER — LIDOCAINE HCL (CARDIAC) 20 MG/ML IV SOLN
INTRAVENOUS | Status: AC
  Filled 2014-06-28: qty 10

## 2014-06-28 MED ORDER — HYDROMORPHONE HCL 1 MG/ML IJ SOLN: 0.2500 mg | INTRAMUSCULAR | Status: DC | PRN

## 2014-06-28 MED ORDER — ONDANSETRON HCL 4 MG/2ML IJ SOLN
INTRAMUSCULAR | Status: DC | PRN
Start: 2014-06-28 — End: 2014-06-28
  Administered 2014-06-28: 4 mg via INTRAVENOUS

## 2014-06-28 MED ORDER — MIDAZOLAM HCL 5 MG/5ML IJ SOLN
INTRAMUSCULAR | Status: DC | PRN
Start: 2014-06-28 — End: 2014-06-28
  Administered 2014-06-28: 2 mg via INTRAVENOUS

## 2014-06-28 SURGICAL SUPPLY — 42 items
BNDG CONFORM 2 STRL LF (GAUZE/BANDAGES/DRESSINGS) IMPLANT
BNDG GAUZE ELAST 4 BULKY (GAUZE/BANDAGES/DRESSINGS) IMPLANT
CANISTER SUCTION 2500CC (MISCELLANEOUS) IMPLANT
CATH ROBINSON RED A/P 16FR (CATHETERS) IMPLANT
CLEANER TIP ELECTROSURG 2X2 (MISCELLANEOUS) ×2 IMPLANT
CONT SPEC 4OZ CLIKSEAL STRL BL (MISCELLANEOUS) ×2 IMPLANT
CORDS BIPOLAR (ELECTRODE) ×2 IMPLANT
COVER SURGICAL LIGHT HANDLE (MISCELLANEOUS) ×2 IMPLANT
DRAIN PENROSE 1/4X12 LTX STRL (WOUND CARE) IMPLANT
DRSG EMULSION OIL 3X3 NADH (GAUZE/BANDAGES/DRESSINGS) IMPLANT
DRSG VAC ATS LRG SENSATRAC (GAUZE/BANDAGES/DRESSINGS) ×2 IMPLANT
ELECT COATED BLADE 2.86 ST (ELECTRODE) ×2 IMPLANT
ELECT NEEDLE TIP 2.8 STRL (NEEDLE) IMPLANT
ELECT REM PT RETURN 9FT ADLT (ELECTROSURGICAL) ×2
ELECTRODE REM PT RTRN 9FT ADLT (ELECTROSURGICAL) ×1 IMPLANT
FORCEPS BIPOLAR SPETZLER 8 1.0 (NEUROSURGERY SUPPLIES) ×2 IMPLANT
GAUZE PACKING IODOFORM 1/4X5 (PACKING) IMPLANT
GAUZE SPONGE 4X4 12PLY STRL (GAUZE/BANDAGES/DRESSINGS) IMPLANT
GAUZE SPONGE 4X4 16PLY XRAY LF (GAUZE/BANDAGES/DRESSINGS) ×2 IMPLANT
GLOVE ECLIPSE 7.5 STRL STRAW (GLOVE) ×2 IMPLANT
GOWN STRL REUS W/ TWL LRG LVL3 (GOWN DISPOSABLE) ×2 IMPLANT
GOWN STRL REUS W/TWL LRG LVL3 (GOWN DISPOSABLE) ×2
KIT BASIN OR (CUSTOM PROCEDURE TRAY) ×2 IMPLANT
KIT ROOM TURNOVER OR (KITS) ×2 IMPLANT
NEEDLE HYPO 30X.5 LL (NEEDLE) ×2 IMPLANT
NS IRRIG 1000ML POUR BTL (IV SOLUTION) ×2 IMPLANT
PAD ARMBOARD 7.5X6 YLW CONV (MISCELLANEOUS) ×4 IMPLANT
PENCIL FOOT CONTROL (ELECTRODE) ×2 IMPLANT
POUCH STERILIZING 3 X22 (STERILIZATION PRODUCTS) IMPLANT
SUT CHROMIC 4 0 P 3 18 (SUTURE) ×2 IMPLANT
SUT ETHILON 4 0 PS 2 18 (SUTURE) ×2 IMPLANT
SUT ETHILON 5 0 P 3 18 (SUTURE) ×1
SUT NYLON ETHILON 5-0 P-3 1X18 (SUTURE) ×1 IMPLANT
SUT SILK 4 0 REEL (SUTURE) ×2 IMPLANT
SWAB COLLECTION DEVICE MRSA (MISCELLANEOUS) IMPLANT
SYR BULB IRRIGATION 50ML (SYRINGE) IMPLANT
SYR CONTROL 10ML LL (SYRINGE) ×2 IMPLANT
TOWEL OR 17X24 6PK STRL BLUE (TOWEL DISPOSABLE) ×2 IMPLANT
TOWEL OR 17X26 10 PK STRL BLUE (TOWEL DISPOSABLE) ×2 IMPLANT
TRAY ENT MC OR (CUSTOM PROCEDURE TRAY) ×2 IMPLANT
TUBE ANAEROBIC SPECIMEN COL (MISCELLANEOUS) IMPLANT
YANKAUER SUCT BULB TIP NO VENT (SUCTIONS) IMPLANT

## 2014-06-28 NOTE — Interval H&P Note (Signed)
History and Physical Interval Note:  06/28/2014 8:35 AM  Eric CraneJon Shaw  has presented today for surgery, with the diagnosis of DEBRIDEMENT OF DEAD TISSUE  The various methods of treatment have been discussed with the patient and family. After consideration of risks, benefits and other options for treatment, the patient has consented to  Procedure(s):  REPEAT DEBRIEDMENT OF NECK  WOUND (N/A) as a surgical intervention .  The patient's history has been reviewed, patient examined, no change in status, stable for surgery.  I have reviewed the patient's chart and labs.  Patient is intubated and sedated. Power of Gerrit Friendsttorney could not be reached to provide consent but this is a medical emergency and certainly needs to be done today. We will proceed.     Gracee Ratterree

## 2014-06-28 NOTE — Progress Notes (Signed)
Patient returned from OR extubated. Patient not in distress. No complications. Vital signs stable at this time. RT will continue to monitor.

## 2014-06-28 NOTE — Clinical Documentation Improvement (Signed)
  MD's, NP's, and PA's  Noted patient has had "debridement " of his Necrotizing Fascitis please clarify documentation in the medical record of type of "debridement."  Please document the below (1) key elements in op/ progress note:  Excisional vs. Nonexcisional?  Treatment: surgical debridement  Thank You,  Lavonda JumboLawanda J Shelvia Fojtik ,RN Clinical Documentation Specialist:  [phone]  [facility] Information Management

## 2014-06-28 NOTE — Anesthesia Postprocedure Evaluation (Signed)
  Anesthesia Post-op Note  Patient: Eric Shaw  Procedure(s) Performed: Procedure(s):  REPEAT DEBRIEDMENT OF NECK  WOUND (N/A)  Patient Location: PACU  Anesthesia Type:General  Level of Consciousness: sedated and patient cooperative  Airway and Oxygen Therapy: Patient Spontanous Breathing  Post-op Pain: mild  Post-op Assessment: Post-op Vital signs reviewed, Patient's Cardiovascular Status Stable, Respiratory Function Stable, Patent Airway, No signs of Nausea or vomiting and Pain level controlled  Post-op Vital Signs: stable  Last Vitals:  Filed Vitals:   06/28/14 1015  BP: 139/73  Pulse: 115  Temp:   Resp: 19    Complications: No apparent anesthesia complications

## 2014-06-28 NOTE — Progress Notes (Signed)
Returned from PACU, alert but sleepy. Following commands. Oriented to place , time , and situation. Neck dressing intact, wound vac intact with negative pressure suction. Patient denies pain at this time.

## 2014-06-28 NOTE — Progress Notes (Signed)
PULMONARY / CRITICAL CARE MEDICINE   Name: Eric Shaw MRN: [id number] DOB: 15-Apr-1961    ADMISSION DATE:  06/19/2014 CONSULTATION DATE:  06/24/14  REFERRING MD :  Marzetta Merino  CHIEF COMPLAINT:  Nec fasc, post op resp failure  INITIAL PRESENTATION: 54 yr old male, presents post op neck debridement secondary to nec fasc  STUDIES:  2/19 CT neck - severe cellulitis involving the posterior soft tissues of the occiput and upper cervical spine. Edema extends into the trapezius and semispinalis capitis musculature. 2/19 CT neck - Progressive posterior cervical/occipital cellulitis and myositis There is a mix of phlegmon and abscess in the bilateral posterior cervical space and left paraspinal compartment, as above. Stable mild retropharyngeal edema.  SIGNIFICANT EVENTS: 2/20 OR, abscess drainage, findings of nec fasc, left on vent, airway difficult 2/22 pos balance 2/23- int prom on pcxr  SUBJECTIVE:  No pain RASS -1 S/p debridement on 2/22 Propofol and fentanyl UOP of 4.1L  VITAL SIGNS: Temp:  [98 F (36.7 C)-99.5 F (37.5 C)] 99.1 F (37.3 C) (02/24 0349) Pulse Rate:  [75-96] 76 (02/24 0630) Resp:  [10-18] 16 (02/24 0630) BP: (101-153)/(60-81) 125/65 mmHg (02/24 0630) SpO2:  [95 %-100 %] 100 % (02/24 0630) FiO2 (%):  [30 %] 30 % (02/24 0326) Weight:  [221 lb 1.9 oz (100.3 kg)] 221 lb 1.9 oz (100.3 kg) (02/24 0334) HEMODYNAMICS: CVP:  [4 mmHg-8 mmHg] 4 mmHg VENTILATOR SETTINGS: Vent Mode:  [-] PRVC FiO2 (%):  [30 %] 30 % Set Rate:  [16 bmp] 16 bmp Vt Set:  [580 mL] 580 mL PEEP:  [5 cmH20] 5 cmH20 Plateau Pressure:  [15 cmH20-18 cmH20] 16 cmH20 INTAKE / OUTPUT:  Intake/Output Summary (Last 24 hours) at 06/28/14 0709 Last data filed at 06/28/14 0600  Gross per 24 hour  Intake 1357.7 ml  Output   4100 ml  Net -2742.3 ml    PHYSICAL EXAMINATION: General:  Sedated Neuro:  rass -1, follows commands HEENT:  Left neck wound dressing is clean, dry and  intact Cardiovascular:  s1 s2 RRR distant Lungs:  CTA bilaterally Abdomen:  Soft, bowel sounds present, no tenderness, guarding or rebound Musculoskeletal: no edema  Skin:  No rashes  LABS:  CBC  Recent Labs Lab 06/25/14 0500 06/26/14 0500 06/27/14 0400  WBC 17.2* 12.3* 11.4*  HGB 11.5* 10.9* 10.5*  HCT 34.2* 32.6* 31.8*  PLT 359 382 391   Coag's No results for input(s): APTT, INR in the last 168 hours. BMET  Recent Labs Lab 06/25/14 0500 06/26/14 0500 06/27/14 0400  NA 135 137 137  K 4.1 3.6 3.6  CL 103 104 103  CO2 BUN <5* 7 7  CREATININE 0.53 0.53 0.54  GLUCOSE 138* 112* 115*   Electrolytes  Recent Labs Lab 06/25/14 0500  06/26/14 0259 06/26/14 0500 06/26/14 1635 06/27/14 0250 06/27/14 0400  CALCIUM 7.9*  --   --  7.9*  --   --  8.0*  MG  --   < > 1.7  --  1.6 1.6  --   PHOS  --   < > 2.6  --  3.0 3.6  --   < > = values in this interval not displayed. Sepsis Markers  Recent Labs Lab 06/24/14 1650  LATICACIDVEN 1.6   ABG  Recent Labs Lab 06/24/14 1648  PHART 7.413  PCO2ART 41.2  PO2ART 118.0*   Liver Enzymes  Recent Labs Lab 06/21/14 0753 06/25/14 0500 06/26/14 0500  AST 30 14  16  ALT 19 12 12   ALKPHOS 134* 148* 125*  BILITOT 1.1 0.5 0.4  ALBUMIN 1.7* 1.4* 1.5*   Cardiac Enzymes No results for input(s): TROPONINI, PROBNP in the last 168 hours. Glucose  Recent Labs Lab 06/27/14 0837 06/27/14 1125 06/27/14 1527 06/27/14 1910 06/28/14 0035 06/28/14 0352  GLUCAP 141* 185* 138* 155* 147* 142*    Imaging Dg Chest Port 1 View  06/27/2014   CLINICAL DATA:  Hypoxia  EXAM: PORTABLE CHEST - 1 VIEW  COMPARISON:  June 25, 2014  FINDINGS: Endotracheal tube tip is 3.8 cm above the carina. Central catheter tip is at the junction of the left innominate vein and superior vena cava. Nasogastric tube tip and side port are below the diaphragm. No pneumothorax. There is atelectatic change in the left base. Lungs elsewhere  clear. Heart is upper normal in size with pulmonary vascularity within normal limits. No adenopathy.  IMPRESSION: Tube and catheter positions as described without pneumothorax. Left lower lobe atelectasis. Elsewhere lungs clear.   Electronically Signed   By: Bretta BangWilliam  Woodruff III M.D.   On: 06/27/2014 08:06     ASSESSMENT / PLAN:  PULMONARY OETT 2/20>>> A: Post op remained intubated, concern difficult airway, resp failure, increased edema P:   Wean cpap 5 ps 5, goal 1 hr, assess rsbi Neg balance noted, maintain even to neg further D/w anesthesia , likely can extubate in OR or at bedside if no further OR trips needs  CARDIOVASCULAR CVL 2/20 (in or)>>> A: NO evidence severe sepsis, HTN at home, borderline BP P:  Even balance Tele Fluids at kvo Keep holding tenormin Lasix 20 BID x 2 more doses  RENAL A:  Sepsis resolving, some pulm edema P:   Chem in am Saline at kvo Lasix maintain Need am chem back  GASTROINTESTINAL A:  Critical care nutrition needs Protein-calorie malnutrition P:   Senokot-s BID Follow for BM ppi NPO in am for procedure  HEMATOLOGIC A:  DVT prevention, leukocytosis (improving) P:  Continue lovenox post op Limit phlebtomomy  INFECTIOUS A: Nec Fasc, abscess neck, MSSA; P:   BCx2 2/15 >>> No growth Neck Abscess 2/20>>>MSSA Abx: Ancef 2/20>>>3/1 Will need minimum 14 days  ENDOCRINE A:  DM P:   ssi  NEUROLOGIC A:  Vent dyschrony, pain P:   RASS goal: -1 Wean propofol today as oversedated Fentanyl gtt WUA mandatory post procedure  FAMILY  - Updates: no family present  - Inter-disciplinary family meet or Palliative Care meeting due by:  2.27.16  Jacquelin Hawkingalph Nettey, MD PGY-2, [facility] 06/28/2014, 7:09 AM   STAFF NOTE: Cindi CarbonI, Dhruva Orndoff, MD FACP have personally reviewed patient's available data, including medical history, events of note, physical examination and test results as part of my evaluation. I have discussed  with resident/NP and other care providers such as pharmacist, RN and RRT. In addition, I personally evaluated patient and elicited key findings of: less coarse, follows commands well, dressing dry, to OR this am , weaning well, lasix to maintain, ancef to stop date, likley to wean to extubate today if no furtehr OR trips, TF held The patient is critically ill with multiple organ systems failure and requires high complexity decision making for assessment and support, frequent evaluation and titration of therapies, application of advanced monitoring technologies and extensive interpretation of multiple databases.   Critical Care Time devoted to patient care services described in this note is30 Minutes. This time reflects time of care of this signee: Rory Percyaniel Maye Parkinson, MD FACP.  This critical care time does not reflect procedure time, or teaching time or supervisory time of PA/NP/Med student/Med Resident etc but could involve care discussion time. Rest per NP/medical resident whose note is outlined above and that I agree with   Mcarthur Rossetti. Tyson Alias, MD, FACP Pgr: [phone]  Pulmonary & Critical Care 06/28/2014 9:02 AM

## 2014-06-28 NOTE — Anesthesia Preprocedure Evaluation (Addendum)
Anesthesia Evaluation  Patient identified by MRN, date of birth, ID band Patient awake    Reviewed: Allergy & Precautions, NPO status , Patient's Chart, lab work & pertinent test results  Airway        Dental   Pulmonary sleep apnea , Current Smoker,          Cardiovascular hypertension,     Neuro/Psych Depression    GI/Hepatic   Endo/Other  diabetes, Type 2, Oral Hypoglycemic Agents  Renal/GU      Musculoskeletal  (+) Arthritis -,   Abdominal   Peds  Hematology   Anesthesia Other Findings   Reproductive/Obstetrics                            Anesthesia Physical Anesthesia Plan  ASA: III  Anesthesia Plan: General   Post-op Pain Management:    Induction: Inhalational  Airway Management Planned: Oral ETT  Additional Equipment:   Intra-op Plan:   Post-operative Plan: Post-operative intubation/ventilation  Informed Consent: I have reviewed the patients History and Physical, chart, labs and discussed the procedure including the risks, benefits and alternatives for the proposed anesthesia with the patient or authorized representative who has indicated his/her understanding and acceptance.     Plan Discussed with: CRNA, Anesthesiologist and Surgeon  Anesthesia Plan Comments: (Pt already intubated and will remain intubated after procedure.)       Anesthesia Quick Evaluation

## 2014-06-28 NOTE — Consult Note (Addendum)
WOC consult requested to assist with Vac dressing change.  Pt went to the OR today for I&D and Vac application.  Discussed plan of care with surgeon.  He plans to remove first post-op dressing on Friday to assess neck wound and WOC will plan to re-apply Vac dressing at that time. Currently, Vac intact with good seal at 100mm const suction.to posterior neck, mod amt yellow-pink drainage in cannister. Cammie Mcgeeawn Grier Czerwinski MSN, RN, CWOCN, [place], CNS [phone]

## 2014-06-28 NOTE — Op Note (Signed)
OPERATIVE REPORT  DATE OF SURGERY: 06/28/2014  PATIENT:  Eric Shaw,  54 y.o. male  PRE-OPERATIVE DIAGNOSIS:  Necrotizing fasciitis  POST-OPERATIVE DIAGNOSIS:  Same  PROCEDURE:  Procedure(s):  REPEAT DEBRIEDMENT OF NECK  WOUND, wound VAC application  SURGEON:  Susy FrizzleJefry H Nadav Swindell, MD  ASSISTANTS: None  ANESTHESIA:   General   EBL:  50 ml  DRAINS: None  LOCAL MEDICATIONS USED:  None  SPECIMEN:  none  COUNTS:  Correct  PROCEDURE DETAILS: The patient was taken to the operating room and placed on the operating table in the prone position. He had previously been intubated and sedated. Gen. anesthesia was instituted. The posterior neck was prepped and draped in a standard fashion. The packing was removed. The wound was inspected. There was granulation tissue in multiple areas. There some residual necrosis superiorly that was debrided. A large portion of the upper and a smaller rim of the lower skin had declared itself as necrotic and was also removed until the residual skin was vital. The wound was irrigated with saline. A large wound VAC was applied, the entire foam was placed into the wound. Adhesive dressing was applied with additional supplementation as needed. The vacuum system was applied and there was good seal. The patient was returned back to the supine position and extubated, transferred to PACU in stable condition.    PATIENT DISPOSITION:  To PACU, stable

## 2014-06-28 NOTE — Transfer of Care (Signed)
Immediate Anesthesia Transfer of Care Note  Patient: Eric Shaw  Procedure(s) Performed: Procedure(s):  REPEAT DEBRIEDMENT OF NECK  WOUND (N/A)  Patient Location: PACU  Anesthesia Type:General  Level of Consciousness: awake and sedated  Airway & Oxygen Therapy: Patient Spontanous Breathing and Patient connected to face mask oxygen  Post-op Assessment: Report given to RN, Post -op Vital signs reviewed and stable and Patient moving all extremities X 4  Post vital signs: Reviewed and stable  Last Vitals:  Filed Vitals:   06/28/14 0832  BP:   Pulse:   Temp: 37 C  Resp:    HR 107, R 21, Sats 96% on FM, BP 127/77  Complications: No apparent anesthesia complications

## 2014-06-29 ENCOUNTER — Encounter (HOSPITAL_COMMUNITY): Payer: Self-pay | Admitting: Otolaryngology

## 2014-06-29 ENCOUNTER — Inpatient Hospital Stay: Payer: Self-pay | Admitting: Internal Medicine

## 2014-06-29 DIAGNOSIS — A4102 Sepsis due to Methicillin resistant Staphylococcus aureus: Secondary | ICD-10-CM

## 2014-06-29 DIAGNOSIS — E1349 Other specified diabetes mellitus with other diabetic neurological complication: Secondary | ICD-10-CM

## 2014-06-29 DIAGNOSIS — E872 Acidosis: Secondary | ICD-10-CM | POA: Diagnosis not present

## 2014-06-29 DIAGNOSIS — E1129 Type 2 diabetes mellitus with other diabetic kidney complication: Secondary | ICD-10-CM

## 2014-06-29 DIAGNOSIS — L03221 Cellulitis of neck: Secondary | ICD-10-CM | POA: Diagnosis not present

## 2014-06-29 LAB — GLUCOSE, CAPILLARY
GLUCOSE-CAPILLARY: 110 mg/dL — AB (ref 70–99)
GLUCOSE-CAPILLARY: 133 mg/dL — AB (ref 70–99)
GLUCOSE-CAPILLARY: 172 mg/dL — AB (ref 70–99)
GLUCOSE-CAPILLARY: 162 mg/dL — AB (ref 70–99)
Glucose-Capillary: 131 mg/dL — ABNORMAL HIGH (ref 70–99)
Glucose-Capillary: 155 mg/dL — ABNORMAL HIGH (ref 70–99)
Glucose-Capillary: 160 mg/dL — ABNORMAL HIGH (ref 70–99)

## 2014-06-29 LAB — ANAEROBIC CULTURE: Gram Stain: NONE SEEN

## 2014-06-29 LAB — BASIC METABOLIC PANEL
Anion gap: 7 (ref 5–15)
BUN: 8 mg/dL (ref 6–23)
CHLORIDE: 96 mmol/L (ref 96–112)
CO2: 32 mmol/L (ref 19–32)
Calcium: 8.4 mg/dL (ref 8.4–10.5)
Creatinine, Ser: 0.56 mg/dL (ref 0.50–1.35)
GFR calc non Af Amer: >90 mL/min (ref >90)
Glucose, Bld: 116 mg/dL — ABNORMAL HIGH (ref 70–99)
POTASSIUM: 3.9 mmol/L (ref 3.5–5.1)
SODIUM: 135 mmol/L (ref 135–145)

## 2014-06-29 LAB — CBC
HCT: 30.4 % — ABNORMAL LOW (ref 39.0–52.0)
HEMOGLOBIN: 10.1 g/dL — AB (ref 13.0–17.0)
MCH: 30.3 pg (ref 26.0–34.0)
MCHC: 33.2 g/dL (ref 30.0–36.0)
MCV: 91.3 fL (ref 78.0–100.0)
Platelets: 426 10*3/uL — ABNORMAL HIGH (ref 150–400)
RBC: 3.33 MIL/uL — ABNORMAL LOW (ref 4.22–5.81)
RDW: 13.8 % (ref 11.5–15.5)
WBC: 15.6 10*3/uL — ABNORMAL HIGH (ref 4.0–10.5)

## 2014-06-29 MED ORDER — MORPHINE SULFATE 2 MG/ML IJ SOLN
2.0000 mg | INTRAMUSCULAR | Status: DC | PRN
  Administered 2014-06-29: 2 mg via INTRAVENOUS
  Filled 2014-06-29: qty 1

## 2014-06-29 MED ORDER — CETYLPYRIDINIUM CHLORIDE 0.05 % MT LIQD
7.0000 mL | Freq: Two times a day (BID) | OROMUCOSAL | Status: DC
  Administered 2014-06-29 – 2014-07-06 (×9): 7 mL via OROMUCOSAL

## 2014-06-29 NOTE — Progress Notes (Signed)
PULMONARY / CRITICAL CARE MEDICINE   Name: Eric Shaw MRN: [id number] DOB: January 30, 1961    ADMISSION DATE:  06/19/2014 CONSULTATION DATE:  06/24/14  REFERRING MD :  Marzetta Merino  CHIEF COMPLAINT:  Nec fasc, post op resp failure  INITIAL PRESENTATION: 54 yr old male, presents post op neck debridement secondary to nec fasc  STUDIES:  2/19 CT neck - severe cellulitis involving the posterior soft tissues of the occiput and upper cervical spine. Edema extends into the trapezius and semispinalis capitis musculature. 2/19 CT neck - Progressive posterior cervical/occipital cellulitis and myositis There is a mix of phlegmon and abscess in the bilateral posterior cervical space and left paraspinal compartment, as above. Stable mild retropharyngeal edema.  SIGNIFICANT EVENTS: 2/20 OR, abscess drainage, findings of nec fasc, left on vent, airway difficult 2/22 pos balance 2/23- int prom on pcxr  SUBJECTIVE: Patient reports no problems overnight except for being hungry S/p repeat debridement on 2/24 UOP of 2.8L  VITAL SIGNS: Temp:  [98.4 F (36.9 C)-99.9 F (37.7 C)] 99 F (37.2 C) (02/25 0354) Pulse Rate:  [83-115] 94 (02/25 0600) Resp:  [12-37] 20 (02/25 0600) BP: (115-146)/(66-84) 126/66 mmHg (02/25 0600) SpO2:  [96 %-100 %] 100 % (02/25 0600) FiO2 (%):  [30 %] 30 % (02/24 0742) Weight:  [209 lb 3.5 oz (94.9 kg)] 209 lb 3.5 oz (94.9 kg) (02/25 0413) HEMODYNAMICS: CVP:  [4 mmHg] 4 mmHg VENTILATOR SETTINGS: Vent Mode:  [-] CPAP;PSV FiO2 (%):  [30 %] 30 % PEEP:  [5 cmH20] 5 cmH20 Pressure Support:  [8 cmH20] 8 cmH20 INTAKE / OUTPUT:  Intake/Output Summary (Last 24 hours) at 06/29/14 0703 Last data filed at 06/29/14 0600  Gross per 24 hour  Intake    716 ml  Output   2180 ml  Net  -1464 ml    PHYSICAL EXAMINATION: General:  laying in bed, no distress Neuro:  rass 0, alert, oriented HEENT:  Wound vac in place on neck Cardiovascular:  s1 s2 RRR Lungs:  CTA bilaterally,  no crackles Abdomen:  Soft, bowel sounds present, no tenderness, guarding or rebound Musculoskeletal: no edema  Skin:  No rashes  LABS:  CBC  Recent Labs Lab 06/27/14 0400 06/28/14 0730 06/29/14 0415  WBC 11.4* 10.8* 15.6*  HGB 10.5* 10.3* 10.1*  HCT 31.8* 31.7* 30.4*  PLT 391 391 426*   Coag's No results for input(s): APTT, INR in the last 168 hours. BMET  Recent Labs Lab 06/27/14 0400 06/28/14 0730 06/29/14 0415  NA 137 136 135  K 3.6 3.7 3.9  CL 103 96 96  CO2 31 30 32  BUN CREATININE 0.54 0.51 0.56  GLUCOSE 115* 145* 116*   Electrolytes  Recent Labs Lab 06/26/14 1635 06/27/14 0250 06/27/14 0400 06/28/14 0730 06/29/14 0415  CALCIUM  --   --  8.0* 8.3* 8.4  MG 1.6 1.6  --  1.9  --   PHOS 3.0 3.6  --  3.1  --    Sepsis Markers  Recent Labs Lab 06/24/14 1650  LATICACIDVEN 1.6   ABG  Recent Labs Lab 06/24/14 1648  PHART 7.413  PCO2ART 41.2  PO2ART 118.0*   Liver Enzymes  Recent Labs Lab 06/25/14 0500 06/26/14 0500  AST 14 16  ALT 12 12  ALKPHOS 148* 125*  BILITOT 0.5 0.4  ALBUMIN 1.4* 1.5*   Cardiac Enzymes No results for input(s): TROPONINI, PROBNP in the last 168 hours. Glucose  Recent Labs Lab 06/28/14 1000 06/28/14 1154 06/28/14  1534 06/28/14 1905 06/29/14 0022 06/29/14 0356  GLUCAP 140* 154* 155* 103* 131* 110*    Imaging Dg Chest Port 1 View  06/28/2014   CLINICAL DATA:  Ventilator dependent respiratory failure  EXAM: PORTABLE CHEST - 1 VIEW  COMPARISON:  Portable chest x-ray of 06/27/2014  FINDINGS: The tip of the endotracheal tube is approximately 3.7 cm above the carina. Haziness remains particularly at the right lung base consistent with atelectasis and possible small effusion. Mild left basilar atelectasis remains. The left central venous line tip overlies the mid SVC near the junction with the left innominate vein and heart size is stable.  IMPRESSION: 1. Endotracheal tube tip approximately 3.7 cm above  the carina. 2. Probable basilar atelectasis and small effusions, especially on the right.   Electronically Signed   By: Dwyane DeePaul  Barry M.D.   On: 06/28/2014 08:11     ASSESSMENT / PLAN:  PULMONARY OETT 2/20>>>2/24 A: Post op remained intubated, concern difficult airway, resp failure, increased edema P:   O2 to keep sats >92% CARDIOVASCULAR CVL 2/20 (in or)>>> A: NO evidence severe sepsis, HTN at home, borderline BP P:  Even balance Tele Fluids at kvo Keep holding tenormin, HR and BP stable.  RENAL A:  Some pulm edema, appears improved P:   Chem [facility]  GASTROINTESTINAL A:  Critical care nutrition needs Protein-calorie malnutrition P:   Senokot-s BID Follow for BM PPI NPO until swallow eval for difficult extubation  HEMATOLOGIC A:  DVT prevention Leukocytosis worsened s/p steriods P:  Continue lovenox Limit phlebtomomy CBC in AM  INFECTIOUS A: Nec Fasc, abscess neck, MSSA; P:   BCx2 2/15 >>> No growth Neck Abscess 2/20>>>MSSA Abx: Ancef 2/20>>>3/1 Will need minimum 14 days  ENDOCRINE A:  DM P:   ssi  NEUROLOGIC A:  No active issues P:   RASS goal: 0   FAMILY  - Updates: no family present, patient updated bedside.  - Inter-disciplinary family meet or Palliative Care meeting due by:  2.27.16  Jacquelin Hawkingalph Nettey, MD PGY-2, [facility] 06/29/2014, 7:03 AM   Transfer to SDU[facility] evaluation pending, note patient is a difficult airway.  If further surgical interventions are needed then will consider transfer to ICU.  Patient seen and examined, agree with above note.  I dictated the care and orders written for this patient under my direction.  Alyson ReedyWesam G Yacoub, MD [phone]

## 2014-06-29 NOTE — Progress Notes (Signed)
Patient ID: Eric CraneJon Tyree, male   DOB: [birth year], 54 y.o.   MRN: [id number] Awake and alert, pain controlled with meds. Trouble with wound vac yesterday but after the tubing was replaced it is now working well. I have discussed the case with the wound care nurses and Dr. Kelly SplinterSanger. They will both view the wound tomorrow after I remove the vac and will make additional recommendations.

## 2014-06-29 NOTE — Progress Notes (Signed)
145cc's of sanguineous fluid noted in wound vac upon [facility] from [facility].

## 2014-06-29 NOTE — Evaluation (Signed)
Clinical/Bedside Swallow Evaluation Patient Details  Name: Eric Shaw MRN: [id number] Date of Birth: [birth year]  Today's Date: 06/29/2014 Time: SLP Start Time (ACUTE ONLY): 1007 SLP Stop Time (ACUTE ONLY): 1020 SLP Time Calculation (min) (ACUTE ONLY): 13 min  Past Medical History:  Past Medical History  Diagnosis Date  . Plantar fasciitis, bilateral   . Hypertension   . High cholesterol   . Type II diabetes mellitus   . Sleep apnea     "not suppose to wear mask" (06/19/2014)  . Arthritis     "all 10 toes" (06/19/2014)  . Depression    Past Surgical History:  Past Surgical History  Procedure Laterality Date  . Reduction of torsion of testis Left 1979  . Incision and drainage abscess N/A 06/24/2014    Procedure: INCISION AND DRAINAGE OF POSTERIOR NECK ABCESS;  Surgeon: Serena Colonel, MD;  Location: [facility] OR;  Service: ENT;  Laterality: N/A;  . Wound debridement N/A 06/24/2014    Procedure: DEBRIDEMENT OF NECROTIZING FASCITIS ON POSTERIOR NECK;  Surgeon: Serena Colonel, MD;  Location: [facility] OR;  Service: ENT;  Laterality: N/A;  . Incision and drainage abscess N/A 06/26/2014    Procedure: REPEAT DEBRIEDMENT OF NECK  WOUND;  Surgeon: Serena Colonel, MD;  Location: [facility] OR;  Service: ENT;  Laterality: N/A;  . Incision and drainage abscess N/A 06/28/2014    Procedure:  REPEAT DEBRIEDMENT OF NECK  WOUND;  Surgeon: Serena Colonel, MD;  Location: [facility] OR;  Service: ENT;  Laterality: N/A;   HPI:  Patient is 54 year old male with known type 2 diabetes mellitus, last known A1c in August 2014 was 9.8, depression, suicidal ideations in the past and requiring behavioral health hospitalization, morbid obesity, hypertension, presented to [facility] emergency department with main concern of progressively worsening neck pain, swelling, redness, tenderness to touch. CT of the neck with contrast consistent with severe cellulitis extending into trapezius and semispinalis capitis musculature. Diagnosed with SIRS, early sepsis  secondary to above. Improved intially but then worsened. CT neck positive for abcess. S/o surgical intervention 2/20. Left on vent post op due to difficult airway. Back to OR for further debriidment 2/22 and 2/24. Extubated following surgery 2/24.    Assessment / Plan / Recommendation Clinical Impression  Patient presents with a functional appearing oropharyngeal swallow. 1 x cough response noted post swallow with thin liquids, combined with regular texture solids. Suspect delayed swallow initiation due to sensory impairements s/p 4 day intubation. Cough response strong however and appearing effective to clear the airway. Educated patient on rationale for slow rate of intake, small bites/sips, and aspiration risk. Will initiate diet and f/u briefly for tolerance given prolonged intubation.     Aspiration Risk  Mild    Diet Recommendation Regular;Thin liquid   Liquid Administration via: Cup;Straw Medication Administration: Whole meds with liquid Supervision: Patient able to self feed Compensations: Slow rate;Small sips/bites Postural Changes and/or Swallow Maneuvers: Seated upright 90 degrees    Other  Recommendations Oral Care Recommendations: Oral care BID   Follow Up Recommendations  None    Frequency and Duration min 1 x/week  1 week   Pertinent Vitals/Pain n/a     Swallow Study    General HPI: Patient is 54 year old male with known type 2 diabetes mellitus, last known A1c in August 2014 was 9.8, depression, suicidal ideations in the past and requiring behavioral health hospitalization, morbid obesity, hypertension, presented to [facility] emergency department with main concern of progressively worsening neck pain, swelling, redness, tenderness to touch.  CT of the neck with contrast consistent with severe cellulitis extending into trapezius and semispinalis capitis musculature. Diagnosed with SIRS, early sepsis secondary to above. Improved intially but then worsened. CT neck positive  for abcess. S/o surgical intervention 2/20. Left on vent post op due to difficult airway. Back to OR for further debriidment 2/22 and 2/24. Extubated following surgery 2/24.  Type of Study: Bedside swallow evaluation Previous Swallow Assessment: none Diet Prior to this Study: NPO Temperature Spikes Noted: No Respiratory Status: Nasal cannula History of Recent Intubation: Yes Length of Intubations (days): 4 days Date extubated: 06/28/14 Behavior/Cognition: Alert;Cooperative;Pleasant mood Oral Cavity - Dentition: Adequate natural dentition Self-Feeding Abilities: Able to feed self Patient Positioning: Upright in bed Baseline Vocal Quality: Hoarse;Low vocal intensity Volitional Cough: Strong Volitional Swallow: Able to elicit    Oral/Motor/Sensory Function Overall Oral Motor/Sensory Function: Appears within functional limits for tasks assessed   Ice Chips Ice chips: Not tested   Thin Liquid Thin Liquid: Impaired Presentation: Straw Pharyngeal  Phase Impairments: Cough - Immediate;Suspected delayed Swallow (x1 )    Nectar Thick Nectar Thick Liquid: Not tested   Honey Thick Honey Thick Liquid: Not tested   Puree Puree: Within functional limits Presentation: Self Fed;Spoon   Solid   GO   Navi Erber MA, CCC-SLP [phone]  Solid: Within functional limits Presentation: Self Fed       Dniyah Grant Meryl 06/29/2014,10:29 AM

## 2014-06-29 NOTE — Consult Note (Signed)
WOC wound follow up Bedside nurse called to state Vac dressing has been alarming and leaking throughout the night.  Staff attempted to seal by applying multiple layers of drape over the dressing.  Track pad appears to be clogged with old clotted blood.  Removed outer drape and left Vac sponge intact to wound bed; surgeon plans to remove and assess during first post-op dressing change tomorrow. New drape and track pad applied and cont suction on at 100mm.  Small amt blood-tinged fluid draining into new cannister.  Pt tolerated with mod amt discomfort. Cammie Mcgeeawn Luke Falero MSN, RN, CWOCN, [place], CNS [phone]

## 2014-06-29 NOTE — Clinical Social Work Note (Signed)
Clinical Social Worker met with patient at bedside in reference to patient's financial concerns. Patient reported he is concerned with medical bills and obtaining medications once discharged from [facility]. Patient reported payor source status is "Medicaid pending" however he does not remember meeting with financial counselor to complete Medicaid application. Per [facility] financial counselor met with patient yesterday and plans to meet with patient again tomorrow to discuss application/other financial services.   Patient reported he lives alone in an apartment and is not concerned with managing his home finances stating they are taken care off. Patient further stated he has strong friend support and denies any social stressors.   CSW encouraged patient to contact CSW if new needs arises. No further intervention required. CSW signing off.  Glendon Axe, MSW, LCSWA [phone] 06/29/2014 3:00 PM

## 2014-06-30 DIAGNOSIS — E872 Acidosis: Secondary | ICD-10-CM | POA: Diagnosis not present

## 2014-06-30 DIAGNOSIS — L0211 Cutaneous abscess of neck: Secondary | ICD-10-CM

## 2014-06-30 DIAGNOSIS — J9601 Acute respiratory failure with hypoxia: Secondary | ICD-10-CM

## 2014-06-30 DIAGNOSIS — A412 Sepsis due to unspecified staphylococcus: Secondary | ICD-10-CM | POA: Diagnosis not present

## 2014-06-30 DIAGNOSIS — L03221 Cellulitis of neck: Secondary | ICD-10-CM

## 2014-06-30 LAB — CULTURE, BLOOD (ROUTINE X 2)
CULTURE: NO GROWTH
Culture: NO GROWTH

## 2014-06-30 LAB — GLUCOSE, CAPILLARY
GLUCOSE-CAPILLARY: 180 mg/dL — AB (ref 70–99)
GLUCOSE-CAPILLARY: 179 mg/dL — AB (ref 70–99)
GLUCOSE-CAPILLARY: 115 mg/dL — AB (ref 70–99)
Glucose-Capillary: 118 mg/dL — ABNORMAL HIGH (ref 70–99)
Glucose-Capillary: 155 mg/dL — ABNORMAL HIGH (ref 70–99)
Glucose-Capillary: 132 mg/dL — ABNORMAL HIGH (ref 70–99)

## 2014-06-30 MED ORDER — ARIPIPRAZOLE 5 MG PO TABS
5.0000 mg | ORAL_TABLET | Freq: Every morning | ORAL | Status: DC
  Administered 2014-06-30 – 2014-07-06 (×7): 5 mg via ORAL
  Filled 2014-06-30 (×7): qty 1

## 2014-06-30 MED ORDER — OXYCODONE-ACETAMINOPHEN 5-325 MG PO TABS
1.0000 | ORAL_TABLET | ORAL | Status: DC | PRN
  Administered 2014-06-30 – 2014-07-03 (×2): 2 via ORAL
  Administered 2014-07-03: 1 via ORAL
  Filled 2014-06-30 (×2): qty 2
  Filled 2014-06-30: qty 1

## 2014-06-30 MED ORDER — DULOXETINE HCL 60 MG PO CPEP
90.0000 mg | ORAL_CAPSULE | Freq: Every day | ORAL | Status: DC
  Administered 2014-06-30 – 2014-07-06 (×7): 90 mg via ORAL
  Filled 2014-06-30 (×7): qty 1

## 2014-06-30 MED ORDER — MORPHINE SULFATE 2 MG/ML IJ SOLN: 2.0000 mg | INTRAMUSCULAR | Status: DC | PRN

## 2014-06-30 MED ORDER — MORPHINE SULFATE 2 MG/ML IJ SOLN
INTRAMUSCULAR | Status: AC
  Administered 2014-06-30: 2 mg via INTRAVENOUS
  Filled 2014-06-30: qty 1

## 2014-06-30 MED ORDER — MORPHINE BOLUS VIA INFUSION: 2.0000 mg | INTRAVENOUS | Status: DC | PRN

## 2014-06-30 NOTE — Progress Notes (Signed)
Patient found by wound Rn Dawn on the floor between chair and bed. 10 minutes prior to fall patient had been toileted with rn and 3 minutes prior to fall Rn Noah CharonPenny Shelton in room asking if patient needed anything, and at that time she replaced a lead that had came off while patient was sitting in chair. Patient had bedside table with lunch and call bell right next to him. Patient was informed to call Rn if he needed anything at that time as well. Patient has been documented as a low fall risk per my assessment this morning. Patient decided at 13:55 to get up from chair and transfer to the bed. Patient missed bed and slid to the floor, per patient, as it was not witnessed. He states he "did not hit his head."  Patient stated "he didn't want to bother anyone" and has been alert and oriented since I took over his care at 7am with no flucuation in mental status. Dr Malachi BondsShort notified and no further orders at this time but states she will be up to check on patient. Patient put back in bed and given call bell. Patients states he will ask for assistance for anything further.

## 2014-06-30 NOTE — Progress Notes (Signed)
Speech Language Pathology Treatment: Dysphagia  Patient Details Name: Eric Shaw MRN: [id number] DOB: [birth year] Today's Date: 06/30/2014 Time: 7858-8502 SLP Time Calculation (min) (ACUTE ONLY): 8 min  Assessment / Plan / Recommendation Clinical Impression  Pt able to consume regular solids and thin liquids without evidence of aspiration. Has been tolerating diet without concerns. No SLP f/u needed. Will sign off.    HPI HPI: Patient is 54 year old male with known type 2 diabetes mellitus, last known A1c in August 2014 was 9.8, depression, suicidal ideations in the past and requiring behavioral health hospitalization, morbid obesity, hypertension, presented to [facility] emergency department with main concern of progressively worsening neck pain, swelling, redness, tenderness to touch. CT of the neck with contrast consistent with severe cellulitis extending into trapezius and semispinalis capitis musculature. Diagnosed with SIRS, early sepsis secondary to above. Improved intially but then worsened. CT neck positive for abcess. S/o surgical intervention 2/20. Left on vent post op due to difficult airway. Back to OR for further debriidment 2/22 and 2/24. Extubated following surgery 2/24.    Pertinent Vitals    SLP Plan  All goals met    Recommendations Diet recommendations: Regular;Thin liquid Liquids provided via: Cup;Straw Medication Administration: Whole meds with liquid Supervision: Patient able to self feed Compensations: Slow rate;Small sips/bites Postural Changes and/or Swallow Maneuvers: Seated upright 90 degrees              Oral Care Recommendations: Oral care BID Follow up Recommendations: None Plan: All goals met    GO     Eric Shaw, Katherene Ponto 06/30/2014, 10:57 AM

## 2014-06-30 NOTE — Consult Note (Addendum)
WOC wound follow up Dr Pollyann Kennedyosen and Dr Kelly SplinterSanger in earlier to remove Vac dressing and assess wound during first post-op dressing change. Refer to their notes for assessment and plan of care. Wound type: Full thickness post-op wound to posterior neck Measurement: 8X18X2cm with 2 cm undermining to wound edges. Wound bed: Beefy red with visible muscles and tendons Drainage (amount, consistency, odor) Mod amt pink drainage in the cannister, no odor Periwound: Intact skin surrounding Dressing procedure/placement/frequency: Applied Mepitel contact layer to decrease discomfort with next dressing change, and one piece black sponge to 100mm cont suction.  Pt medicated prior to procedure and tolerated with mod amt discomfort.  Plan to change dressing on Mon. Cammie Mcgeeawn Tyona Nilsen MSN, RN, CWOCN, [place], CNS [phone]

## 2014-06-30 NOTE — Progress Notes (Addendum)
Report called to receiving RN [phone]. Patient with no complaints at the current time.

## 2014-06-30 NOTE — Progress Notes (Signed)
Pt transferred to 2W28 via bed with belongings.

## 2014-06-30 NOTE — Progress Notes (Signed)
Eric Shaw is a 54 y.o. male patient who transferred  from 2 floor  awake, alert  & orientated  X 3, Full Code, VSS - Blood pressure 103/72, pulse 97, temperature 98.1 F (36.7 C), temperature source Oral, resp. rate 17, height 5\' 10"  (1.778 m), weight 90.81 kg (200 lb 3.2 oz), SpO2 99 %., O2   @ 2 L nasal cannular, no c/o shortness of breath, no c/o chest pain, no distress noted.    IV site WDL:  Lt hand with a transparent dsg that's clean dry and intact.  Allergies:  No Known Allergies   Past Medical History  Diagnosis Date  . Plantar fasciitis, bilateral   . Hypertension   . High cholesterol   . Type II diabetes mellitus   . Sleep apnea     "not suppose to wear mask" (06/19/2014)  . Arthritis     "all 10 toes" (06/19/2014)  . Depression     Pt orientation to unit, room and routine. SR up x 2, fall risk assessment complete with Patient and family verbalizing understanding of risks associated with falls. Pt verbalizes an understanding of how to use the call bell and to call for help before getting out of bed.  NPWT intact behind on neck .  Will cont to monitor and assist as needed.  Eric Shaw, Eric Husmann, RN 06/30/2014 8:52 PM

## 2014-06-30 NOTE — Progress Notes (Signed)
[facility] PROGRESS NOTE  Eric Shaw ZOX:[id number] DOB: July 15, 1960 DOA: 06/19/2014 PCP: No PCP Per Patient  Brief Summary  The patient is a 54 year old male with history of type 2 diabetes mellitus with hemoglobin A1c of 9.8, depression with suicidal ideations previously requiring admission to behavioral health, morbid obesity, hypertension, who presented to the emergency department with progressively worsening neck pain, swelling, redness, tenderness. He had noticed some swelling about 1 week prior to admission and got progressively worse. He denied fever, chills, but did complain that he felt generally weak and had not been able to eat or take his medications. He was found to have necrotizing fasciitis of the left posterior neck. He was started on broad-spectrum antibiotics and seen by ENT. On 2/20 he was taken to the operating room for debridement of abscess and necrotizing fasciitis. He was intubated for the procedure and he remained intubated for another few days secondary to airway edema. He was extubated on 2/24 and passed a swallowing evaluation on 2/25. He has a wound VAC in place and is being monitored daily by ENT. Cultures have grown MSSA.  STUDIES:  2/19 CT neck - severe cellulitis involving the posterior soft tissues of the occiput and upper cervical spine. Edema extends into the trapezius and semispinalis capitis musculature. 2/19 CT neck - Progressive posterior cervical/occipital cellulitis and myositis There is a mix of phlegmon and abscess in the bilateral posterior cervical space and left paraspinal compartment, as above. Stable mild retropharyngeal edema.  SIGNIFICANT EVENTS: 2/20 OR, abscess drainage, findings of nec fasc, left on vent, airway difficult 2/22 pos balance 2/23- int prom on pcxr  Assessment/Plan  MSSA necrotizing fasciitis of the left neck status post debridement on 2/20  -  Wound VAC to be removed by ENT today and to be reexamined  BCx2 2/15 >>>  No growth Neck Abscess 2/20>>>MSSA Abx: Ancef 2/20>>>3/1 Will need minimum 14 days  Acute hypoxic respiratory failure secondary to interstitial and airway edema postoperatively  -  Wean oxygen as tolerated -  Status post steroids to assist with extubation  Diabetes mellitus type 2, uncontrolled, hemoglobin A1c 9.8 -  CBGs well-controlled on sliding scale insulin -  Continue to hold metformin -  Patient on ACE inhibitor  Major Depression, stable -  Resume Abilify, Cymbalta, gabapentin  Diet:  Diabetic diet Access:  PIV IVF:  Off Proph:  SCDs  Code Status: Full Family Communication: Patient alone Disposition Plan: Pending weaning oxygen, reevaluation by ENT.  He will need a long course of antibiotics, minimum of 14 days.  Transfer to MedSurg.   Consultants:  Critical care  ENT, Dr. Pollyann Kennedy  Procedures: 2/20 OR, abscess drainage, findings of nec fasc, left on vent, airway difficult 2/22 pos balance 2/23- int prom on pcxr  Antibiotics:  vancomcyin 2/16 > 2/17  Ancef 2/17 >>>  Zosyn 2/16 > 2/17  cipro  2/16 > 2/17  HPI/Subjective:  Minimal pain, denies difficulty swallowing or breathing. Does not normally wear oxygen at home. He feels hungry at breakfast time. Denies fevers, chills  Objective: Filed Vitals:   06/30/14 0423 06/30/14 0500 06/30/14 0600 06/30/14 0700  BP:  127/73 124/84 113/70  Pulse:  83 95 84  Temp:      TempSrc:      Resp:  19 21 18   Height:      Weight: 92.715 kg (204 lb 6.4 oz)     SpO2:  96% 99% 100%    Intake/Output Summary (Last 24 hours) at 06/30/14 0757 Last  data filed at 06/30/14 0700  Gross per 24 hour  Intake    380 ml  Output   3430 ml  Net  -3050 ml   Filed Weights   06/28/14 0334 06/29/14 0413 06/30/14 0423  Weight: 100.3 kg (221 lb 1.9 oz) 94.9 kg (209 lb 3.5 oz) 92.715 kg (204 lb 6.4 oz)    Exam:   General:  Adult male, No acute distress  HEENT:  MMM, possible mild left eyelid droop, wound VAC in place over  left posterior neck with serosanguineous drainage. There is a small air leak.  Cardiovascular:  RRR, nl S1, S2 no mrg, 2+ pulses, warm extremities  Respiratory:  CTAB, no increased WOB  Abdomen:   NABS, soft, NT/ND  MSK:   Normal tone and bulk, no LEE  Neuro:  Grossly intact  Data Reviewed: Basic Metabolic Panel:  Recent Labs Lab 06/25/14 0500 06/25/14 1459 06/26/14 0259 06/26/14 0500 06/26/14 1635 06/27/14 0250 06/27/14 0400 06/28/14 0730 06/29/14 0415  NA 135  --   --  137  --   --  137 136 135  K 4.1  --   --  3.6  --   --  3.6 3.7 3.9  CL 103  --   --  104  --   --  103 96 96  CO2 28  --   --  29  --   --  31 30 32  GLUCOSE 138*  --   --  112*  --   --  115* 145* 116*  BUN <5*  --   --  7  --   --  CREATININE 0.53  --   --  0.53  --   --  0.54 0.51 0.56  CALCIUM 7.9*  --   --  7.9*  --   --  8.0* 8.3* 8.4  MG  --  1.9 1.7  --  1.6 1.6  --  1.9  --   PHOS  --  2.7 2.6  --  3.0 3.6  --  3.1  --    Liver Function Tests:  Recent Labs Lab 06/25/14 0500 06/26/14 0500  AST 14 16  ALT 12 12  ALKPHOS 148* 125*  BILITOT 0.5 0.4  PROT 5.3* 5.4*  ALBUMIN 1.4* 1.5*   No results for input(s): LIPASE, AMYLASE in the last 168 hours. No results for input(s): AMMONIA in the last 168 hours. CBC:  Recent Labs Lab 06/25/14 0500 06/26/14 0500 06/27/14 0400 06/28/14 0730 06/29/14 0415  WBC 17.2* 12.3* 11.4* 10.8* 15.6*  NEUTROABS  --  9.0*  --   --   --   HGB 11.5* 10.9* 10.5* 10.3* 10.1*  HCT 34.2* 32.6* 31.8* 31.7* 30.4*  MCV 89.1 90.6 92.7 91.6 91.3  PLT 359 382 391 391 426*   Cardiac Enzymes: No results for input(s): CKTOTAL, CKMB, CKMBINDEX, TROPONINI in the last 168 hours. BNP (last 3 results) No results for input(s): BNP in the last 8760 hours.  ProBNP (last 3 results) No results for input(s): PROBNP in the last 8760 hours.  CBG:  Recent Labs Lab 06/29/14 1131 06/29/14 1537 06/29/14 1929 06/29/14 2312 06/30/14 0347  GLUCAP 155* 172*  162* 160* 118*    Recent Results (from the past 240 hour(s))  Surgical pcr screen     Status: Abnormal   Collection Time: 06/24/14  5:43 AM  Result Value Ref Range Status   MRSA, PCR NEGATIVE NEGATIVE Final   Staphylococcus aureus  POSITIVE (A) NEGATIVE Final    Comment:        The Xpert SA Assay (FDA approved for NASAL specimens in patients over 22 years of age), is one component of a comprehensive surveillance program.  Test performance has been validated by [facility] for patients greater than or equal to 71 year old. It is not intended to diagnose infection nor to guide or monitor treatment. RESULT CALLED TO, READ BACK BY AND VERIFIED WITH: Elberta Leatherwood RN [phone] GREEN R   Anaerobic culture     Status: None   Collection Time: 06/24/14 12:57 PM  Result Value Ref Range Status   Specimen Description TISSUE NECK  Final   Special Requests NONE  Final   Gram Stain   Final    NO WBC SEEN FEW GRAM POSITIVE COCCI IN PAIRS Performed at [facility]    Culture   Final    NO ANAEROBES ISOLATED Performed at [facility]    Report Status 06/29/2014 FINAL  Final  Culture, routine-abscess     Status: None   Collection Time: 06/24/14 12:57 PM  Result Value Ref Range Status   Specimen Description ABSCESS NECK  Final   Special Requests NONE  Final   Gram Stain   Final    RARE WBC PRESENT,BOTH PMN AND MONONUCLEAR MODERATE GRAM POSITIVE COCCI IN PAIRS IN CLUSTERS Performed at [facility]    Culture   Final    ABUNDANT STAPHYLOCOCCUS AUREUS Note: RIFAMPIN AND GENTAMICIN SHOULD NOT BE USED AS SINGLE DRUGS FOR TREATMENT OF STAPH INFECTIONS. Performed at [facility]    Report Status 06/27/2014 FINAL  Final   Organism ID, Bacteria STAPHYLOCOCCUS AUREUS  Final      Susceptibility   Staphylococcus aureus - MIC*    CLINDAMYCIN <=0.25 SENSITIVE Sensitive     ERYTHROMYCIN <=0.25 SENSITIVE Sensitive     GENTAMICIN <=0.5 SENSITIVE Sensitive      LEVOFLOXACIN 0.25 SENSITIVE Sensitive     OXACILLIN 0.5 SENSITIVE Sensitive     PENICILLIN >=0.5 RESISTANT Resistant     RIFAMPIN <=0.5 SENSITIVE Sensitive     TRIMETH/SULFA <=10 SENSITIVE Sensitive     VANCOMYCIN <=0.5 SENSITIVE Sensitive     TETRACYCLINE <=1 SENSITIVE Sensitive     MOXIFLOXACIN <=0.25 SENSITIVE Sensitive     * ABUNDANT STAPHYLOCOCCUS AUREUS  MRSA PCR Screening     Status: None   Collection Time: 06/24/14  3:15 PM  Result Value Ref Range Status   MRSA by PCR NEGATIVE NEGATIVE Final    Comment:        The GeneXpert MRSA Assay (FDA approved for NASAL specimens only), is one component of a comprehensive MRSA colonization surveillance program. It is not intended to diagnose MRSA infection nor to guide or monitor treatment for MRSA infections.   Culture, blood (routine x 2)     Status: None (Preliminary result)   Collection Time: 06/24/14  4:50 PM  Result Value Ref Range Status   Specimen Description RIGHT ANTECUBITAL  Final   Special Requests AEB 10CC  Final   Culture   Final           BLOOD CULTURE RECEIVED NO GROWTH TO DATE CULTURE WILL BE HELD FOR 5 DAYS BEFORE ISSUING A FINAL NEGATIVE REPORT Performed at [facility]    Report Status PENDING  Incomplete  Culture, blood (routine x 2)     Status: None (Preliminary result)   Collection Time: 06/24/14  4:55 PM  Result  Value Ref Range Status   Specimen Description BLOOD RIGHT HAND  Final   Special Requests AEB 10CC  Final   Culture   Final           BLOOD CULTURE RECEIVED NO GROWTH TO DATE CULTURE WILL BE HELD FOR 5 DAYS BEFORE ISSUING A FINAL NEGATIVE REPORT [facility]    Report Status PENDING  Incomplete     Studies: No results found.  Scheduled Meds: . antiseptic oral rinse  7 mL Mouth Rinse BID  . ARIPiprazole  5 mg Per Tube q morning - 10a  .  ceFAZolin (ANCEF) IV  2 g Intravenous 3 times per day  . enoxaparin (LOVENOX) injection  40 mg Subcutaneous Q24H  .  gabapentin  600 mg Oral TID  . insulin aspart  0-20 Units Subcutaneous Q4H  . senna-docusate  1 tablet Oral BID   Continuous Infusions: . sodium chloride 10 mL/hr (06/27/14 2048)    Active Problems:   DM2 (diabetes mellitus, type 2)   Diabetic neuropathy   MDD (major depressive disorder)   Cellulitis   Staphylococcal sepsis   Hyponatremia   Acute respiratory acidosis   Sepsis   Abscess or cellulitis, neck   Cellulitis, neck    Time spent: 30 min    Dequante Tremaine  [facility] Pager [phone]. If 7PM-7AM, please contact night-coverage at [email or web address], password [email or web address] 06/30/2014, 7:57 AM  LOS: 11 days

## 2014-06-30 NOTE — Progress Notes (Signed)
NUTRITION FOLLOW UP  Intervention:   -Continue with current nutritional plan of care  Nutrition Dx:   Inadequate oral intake related to inability to eat as evidenced by NPO status; resolved  Goal:   Pt will meet >90% of estimated nutritional needs  Monitor:   PO intake, labs, weight changes, I/O's  Assessment:   Patient presented post op neck debridement due to necrotizing fasciitis.  Pt extubated on 06/28/14. Nutritional needs re-estimated due to change in status.  SLP reports that pt is tolerating regular consistency diet without signs of aspiration. Noted multiple drinks at bedside. He is currently on a carb modified diet.  No signs of fat or muscle depletion observed.  Labs reviewed. Glucose: 116, CBGS: 118-160.   Height: Ht Readings from Last 1 Encounters:  06/19/14 5\' 10"  (1.778 m)    Weight Status:   Wt Readings from Last 1 Encounters:  06/30/14 204 lb 6.4 oz (92.715 kg)   06/26/14 225 lb 5 oz (102.2 kg)        Re-estimated needs:  Kcal: 2000-2200 Protein: 95-105 grams Fluid: 2.0-2.2 L  Skin: closed neck incision with negative pressure wound therapy  Diet Order: Diet Carb Modified   Intake/Output Summary (Last 24 hours) at 06/30/14 1147 Last data filed at 06/30/14 0700  Gross per 24 hour  Intake    340 ml  Output   2280 ml  Net  -1940 ml    Last BM: 06/29/14   Labs:   Recent Labs Lab 06/26/14 1635 06/27/14 0250 06/27/14 0400 06/28/14 0730 06/29/14 0415  NA  --   --  137 136 135  K  --   --  3.6 3.7 3.9  CL  --   --  103 96 96  CO2  --   --  31 30 32  BUN  --   --  7 13 8   CREATININE  --   --  0.54 0.51 0.56  CALCIUM  --   --  8.0* 8.3* 8.4  MG 1.6 1.6  --  1.9  --   PHOS 3.0 3.6  --  3.1  --   GLUCOSE  --   --  115* 145* 116*    CBG (last 3)   Recent Labs  06/29/14 2312 06/30/14 0347 06/30/14 0802  GLUCAP 160* 118* 155*    Scheduled Meds: . antiseptic oral rinse  7 mL Mouth Rinse BID  . ARIPiprazole  5 mg Oral q morning  - 10a  .  ceFAZolin (ANCEF) IV  2 g Intravenous 3 times per day  . DULoxetine  90 mg Oral Daily  . enoxaparin (LOVENOX) injection  40 mg Subcutaneous Q24H  . gabapentin  600 mg Oral TID  . insulin aspart  0-20 Units Subcutaneous Q4H  . senna-docusate  1 tablet Oral BID    Continuous Infusions: . sodium chloride 10 mL/hr (06/27/14 2048)    Keanna Tugwell A. Mayford KnifeWilliams, RD, LDN, CDE Pager: [phone] After hours Pager: [phone]

## 2014-06-30 NOTE — Progress Notes (Signed)
Patient ID: Eric Shaw, male   DOB: Oct 01, 1960, [age] y.o.   MRN: [id number]   Wound vac removed. Bed of wound all pink and granulating. Will leave open for Dr. Kelly SplinterSanger and wound care nurse to see and redress.

## 2014-07-01 ENCOUNTER — Encounter (HOSPITAL_COMMUNITY): Payer: Self-pay | Admitting: Plastic Surgery

## 2014-07-01 DIAGNOSIS — E1140 Type 2 diabetes mellitus with diabetic neuropathy, unspecified: Secondary | ICD-10-CM | POA: Diagnosis not present

## 2014-07-01 DIAGNOSIS — M726 Necrotizing fasciitis: Secondary | ICD-10-CM

## 2014-07-01 DIAGNOSIS — J9601 Acute respiratory failure with hypoxia: Secondary | ICD-10-CM

## 2014-07-01 DIAGNOSIS — L03221 Cellulitis of neck: Secondary | ICD-10-CM | POA: Diagnosis not present

## 2014-07-01 DIAGNOSIS — A412 Sepsis due to unspecified staphylococcus: Secondary | ICD-10-CM | POA: Diagnosis not present

## 2014-07-01 LAB — BASIC METABOLIC PANEL
Anion gap: 7 (ref 5–15)
BUN: 8 mg/dL (ref 6–23)
CO2: 34 mmol/L — AB (ref 19–32)
CREATININE: 0.6 mg/dL (ref 0.50–1.35)
Calcium: 8.3 mg/dL — ABNORMAL LOW (ref 8.4–10.5)
Chloride: 98 mmol/L (ref 96–112)
GFR calc Af Amer: >90 mL/min (ref >90)
GFR calc non Af Amer: >90 mL/min (ref >90)
GLUCOSE: 94 mg/dL (ref 70–99)
POTASSIUM: 3.4 mmol/L — AB (ref 3.5–5.1)
Sodium: 139 mmol/L (ref 135–145)

## 2014-07-01 LAB — CBC
HEMATOCRIT: 32.5 % — AB (ref 39.0–52.0)
HEMOGLOBIN: 10.4 g/dL — AB (ref 13.0–17.0)
MCH: 29.8 pg (ref 26.0–34.0)
MCHC: 32 g/dL (ref 30.0–36.0)
MCV: 93.1 fL (ref 78.0–100.0)
Platelets: 448 10*3/uL — ABNORMAL HIGH (ref 150–400)
RBC: 3.49 MIL/uL — ABNORMAL LOW (ref 4.22–5.81)
RDW: 13.9 % (ref 11.5–15.5)
WBC: 9 10*3/uL (ref 4.0–10.5)

## 2014-07-01 LAB — GLUCOSE, CAPILLARY
GLUCOSE-CAPILLARY: 108 mg/dL — AB (ref 70–99)
GLUCOSE-CAPILLARY: 151 mg/dL — AB (ref 70–99)
GLUCOSE-CAPILLARY: 171 mg/dL — AB (ref 70–99)
Glucose-Capillary: 137 mg/dL — ABNORMAL HIGH (ref 70–99)
Glucose-Capillary: 162 mg/dL — ABNORMAL HIGH (ref 70–99)
Glucose-Capillary: 220 mg/dL — ABNORMAL HIGH (ref 70–99)

## 2014-07-01 MED ORDER — TAMSULOSIN HCL 0.4 MG PO CAPS
0.4000 mg | ORAL_CAPSULE | Freq: Every day | ORAL | Status: DC
  Administered 2014-07-01 – 2014-07-05 (×6): 0.4 mg via ORAL
  Filled 2014-07-01 (×7): qty 1

## 2014-07-01 MED ORDER — GLUCERNA SHAKE PO LIQD
237.0000 mL | Freq: Two times a day (BID) | ORAL | Status: DC
  Administered 2014-07-01 – 2014-07-06 (×9): 237 mL via ORAL

## 2014-07-01 MED ORDER — POTASSIUM CHLORIDE CRYS ER 20 MEQ PO TBCR
40.0000 meq | EXTENDED_RELEASE_TABLET | Freq: Once | ORAL | Status: AC
  Administered 2014-07-01: 40 meq via ORAL
  Filled 2014-07-01: qty 2

## 2014-07-01 MED ORDER — INSULIN ASPART 100 UNIT/ML ~~LOC~~ SOLN
0.0000 [IU] | Freq: Three times a day (TID) | SUBCUTANEOUS | Status: DC
  Administered 2014-07-02: 4 [IU] via SUBCUTANEOUS
  Administered 2014-07-02: 7 [IU] via SUBCUTANEOUS
  Administered 2014-07-02: 3 [IU] via SUBCUTANEOUS
  Administered 2014-07-03 (×2): 4 [IU] via SUBCUTANEOUS
  Administered 2014-07-03 – 2014-07-04 (×3): 3 [IU] via SUBCUTANEOUS
  Administered 2014-07-04: 7 [IU] via SUBCUTANEOUS
  Administered 2014-07-05 – 2014-07-06 (×4): 4 [IU] via SUBCUTANEOUS

## 2014-07-01 MED ORDER — INSULIN ASPART 100 UNIT/ML ~~LOC~~ SOLN
0.0000 [IU] | Freq: Every day | SUBCUTANEOUS | Status: DC
  Administered 2014-07-03: 2 [IU] via SUBCUTANEOUS
  Administered 2014-07-04: 3 [IU] via SUBCUTANEOUS
  Administered 2014-07-05: 2 [IU] via SUBCUTANEOUS

## 2014-07-01 NOTE — Progress Notes (Signed)
NUTRITION FOLLOW UP  Intervention:   -Glucerna Shake po BID, each supplement provides 220 kcal and 10 grams of protein  Nutrition Dx:   Increased nutrient needs related to wound healing as evidenced by estimated needs; ongoing   Goal:   Pt will meet >90% of estimated nutritional needs; progressing  Monitor:   PO/supplement intake, labs, weight changes, I/O's  Assessment:   Patient presented post op neck debridement due to necrotizing fasciitis.  Pt extubated on 06/28/14.  Pt reports his appetite continues to improve. He ate 100% of his breakfast today and denies any difficulty chewing or swallowing. Her reports his appetite was poor for 8 days PTA. He is happy that his appetite has returned.  Pt with wound vac to lt side of his neck. Discussed importance of good PO intake, especially protein, to help facilitate wound healing. Pt is very receptive to using nutritional supplements to meet increased needs. RD to order Glucerna BID.  Labs reviewed. K: 3.4, CO2: 34, Calcium: 8.3. Glucose WDL CBGS: 108-162, trending down.   Height: Ht Readings from Last 1 Encounters:  06/30/14 5\' 10"  (1.778 m)    Weight Status:   Wt Readings from Last 1 Encounters:  07/01/14 201 lb 15.1 oz (91.6 kg)   06/26/14 225 lb 5 oz (102.2 kg)        Re-estimated needs:  Kcal: 2000-2200 Protein: 95-105 grams Fluid: 2.0-2.2 L  Skin: closed neck incision with negative pressure wound therapy  Diet Order: Diet Carb Modified   Intake/Output Summary (Last 24 hours) at 07/01/14 1053 Last data filed at 07/01/14 0636  Gross per 24 hour  Intake   1106 ml  Output    400 ml  Net    706 ml    Last BM: 06/29/14   Labs:   Recent Labs Lab 06/26/14 1635 06/27/14 0250  06/28/14 0730 06/29/14 0415 07/01/14 0550  NA  --   --   < > 136 135 139  K  --   --   < > 3.7 3.9 3.4*  CL  --   --   < > 96 96 98  CO2  --   --   < > 30 32 34*  BUN  --   --   < > 13 8 8   CREATININE  --   --   < > 0.51 0.56 0.60   CALCIUM  --   --   < > 8.3* 8.4 8.3*  MG 1.6 1.6  --  1.9  --   --   PHOS 3.0 3.6  --  3.1  --   --   GLUCOSE  --   --   < > 145* 116* 94  < > = values in this interval not displayed.  CBG (last 3)   Recent Labs  06/30/14 2314 07/01/14 0313 07/01/14 0806  GLUCAP 137* 162* 108*    Scheduled Meds: . antiseptic oral rinse  7 mL Mouth Rinse BID  . ARIPiprazole  5 mg Oral q morning - 10a  .  ceFAZolin (ANCEF) IV  2 g Intravenous 3 times per day  . DULoxetine  90 mg Oral Daily  . enoxaparin (LOVENOX) injection  40 mg Subcutaneous Q24H  . gabapentin  600 mg Oral TID  . insulin aspart  0-20 Units Subcutaneous Q4H  . potassium chloride  40 mEq Oral Once  . senna-docusate  1 tablet Oral BID  . tamsulosin  0.4 mg Oral QPC supper    Continuous Infusions: .  sodium chloride 10 mL/hr at 06/30/14 2155    Dov Dill A. Mayford Knife, RD, LDN, CDE Pager: [phone] After hours Pager: [phone]

## 2014-07-01 NOTE — Progress Notes (Signed)
[facility] PROGRESS NOTE  Eric Shaw ION:[id number] DOB: [birth year] DOA: 06/19/2014 PCP: No PCP Per Patient  Brief Summary  The patient is a 54 year old male with history of type 2 diabetes mellitus with hemoglobin A1c of 9.8, depression with suicidal ideations previously requiring admission to behavioral health, morbid obesity, hypertension, who presented to the emergency department with progressively worsening neck pain, swelling, redness, tenderness. He had noticed some swelling about 1 week prior to admission and got progressively worse. He denied fever, chills, but did complain that he felt generally weak and had not been able to eat or take his medications. He was found to have necrotizing fasciitis of the left posterior neck. He was started on broad-spectrum antibiotics and seen by ENT. On 2/20 he was taken to the operating room for debridement of abscess and necrotizing fasciitis. He was intubated for the procedure and he remained intubated for another few days secondary to airway edema. He was extubated on 2/24 and passed a swallowing evaluation on 2/25. He has a wound VAC in place and is being monitored daily by ENT. Cultures have grown MSSA.  STUDIES:  2/19 CT neck - severe cellulitis involving the posterior soft tissues of the occiput and upper cervical spine. Edema extends into the trapezius and semispinalis capitis musculature. 2/19 CT neck - Progressive posterior cervical/occipital cellulitis and myositis There is a mix of phlegmon and abscess in the bilateral posterior cervical space and left paraspinal compartment, as above. Stable mild retropharyngeal edema.  SIGNIFICANT EVENTS: 2/20 OR, abscess drainage, findings of nec fasc, left on vent, airway difficult 2/22 pos balance 2/23- int prom on pcxr  Assessment/Plan  MSSA necrotizing fasciitis of the left neck status post debridement on 2/20  -  Wound VAC to be removed by ENT today and to be reexamined  BCx2 2/15 >>>  No growth Neck Abscess 2/20>>>MSSA Ancef 2/20>>>3/1 Will need minimum 14 days  Appreciate ENT/plastic management of wound - plan for further washout, acell and VAC placement  Acute hypoxic respiratory failure secondary to interstitial and airway edema post intubation -  Trial on RA today -  Status post steroids to assist with extubation  Diabetes mellitus type 2, uncontrolled, hemoglobin A1c 9.8 -  CBGs well-controlled on sliding scale insulin -  Continue to hold metformin -  Patient on ACE inhibitor  Major Depression, stable -  Resume Abilify, Cymbalta, gabapentin  Urinary retention -  Start flomax -  Replace foley  Diet:  Diabetic diet Access:  PIV IVF:  Off Proph:  SCDs  Code Status: Full Family Communication: Patient alone Disposition Plan: Pending weaning oxygen, reevaluation by ENT/plastic surgery.     Consultants:  Critical care  ENT, Dr. Pollyann Kennedy  Procedures: 2/20 OR, abscess drainage, findings of nec fasc, left on vent, airway difficult 2/22 pos balance 2/23- int prom on pcxr  Antibiotics:  vancomcyin 2/16 > 2/17  Ancef 2/17 >>>  Zosyn 2/16 > 2/17  cipro  2/16 > 2/17  HPI/Subjective:  Minimal pain, denies difficulty swallowing or breathing. Does not normally wear oxygen at home. He feels hungry at breakfast time. Denies fevers, chills  Objective: Filed Vitals:   06/30/14 1800 06/30/14 1930 06/30/14 2023 07/01/14 0422  BP:  123/74 103/72 116/54  Pulse:  96 97 83  Temp:  98.4 F (36.9 C) 98.1 F (36.7 C) 97.8 F (36.6 C)  TempSrc:  Oral Oral Oral  Resp:  17 17 16   Height:   5\' 10"  (1.778 m)   Weight:   90.81 kg (  200 lb 3.2 oz) 91.6 kg (201 lb 15.1 oz)  SpO2: 96% 97% 99% 100%    Intake/Output Summary (Last 24 hours) at 07/01/14 1012 Last data filed at 07/01/14 0636  Gross per 24 hour  Intake   1106 ml  Output    400 ml  Net    706 ml   Filed Weights   06/30/14 0423 06/30/14 2023 07/01/14 0422  Weight: 92.715 kg (204 lb 6.4 oz)  90.81 kg (200 lb 3.2 oz) 91.6 kg (201 lb 15.1 oz)    Exam:   General:  Adult male, No acute distress, nasal canula still in place  HEENT:  MMM, possible mild left eyelid droop, wound VAC in place over left posterior neck with serosanguineous drainage. no air leak.  Cardiovascular:  RRR, nl S1, S2 no mrg, 2+ pulses, warm extremities  Respiratory:  CTAB, no increased WOB  Abdomen:   NABS, soft, NT/ND  MSK:   Normal tone and bulk, no LEE  Neuro:  Grossly intact  Data Reviewed: Basic Metabolic Panel:  Recent Labs Lab 06/25/14 1459 06/26/14 0259 06/26/14 0500 06/26/14 1635 06/27/14 0250 06/27/14 0400 06/28/14 0730 06/29/14 0415 07/01/14 0550  NA  --   --  137  --   --  137 136 135 139  K  --   --  3.6  --   --  3.6 3.7 3.9 3.4*  CL  --   --  104  --   --  103 96 96 98  CO2  --   --  29  --   --  31 30 32 34*  GLUCOSE  --   --  112*  --   --  115* 145* 116* 94  BUN  --   --  7  --   --  7 13 8 8   CREATININE  --   --  0.53  --   --  0.54 0.51 0.56 0.60  CALCIUM  --   --  7.9*  --   --  8.0* 8.3* 8.4 8.3*  MG 1.9 1.7  --  1.6 1.6  --  1.9  --   --   PHOS 2.7 2.6  --  3.0 3.6  --  3.1  --   --    Liver Function Tests:  Recent Labs Lab 06/25/14 0500 06/26/14 0500  AST 14 16  ALT 12 12  ALKPHOS 148* 125*  BILITOT 0.5 0.4  PROT 5.3* 5.4*  ALBUMIN 1.4* 1.5*   No results for input(s): LIPASE, AMYLASE in the last 168 hours. No results for input(s): AMMONIA in the last 168 hours. CBC:  Recent Labs Lab 06/26/14 0500 06/27/14 0400 06/28/14 0730 06/29/14 0415 07/01/14 0550  WBC 12.3* 11.4* 10.8* 15.6* 9.0  NEUTROABS 9.0*  --   --   --   --   HGB 10.9* 10.5* 10.3* 10.1* 10.4*  HCT 32.6* 31.8* 31.7* 30.4* 32.5*  MCV 90.6 92.7 91.6 91.3 93.1  PLT 382 391 391 426* 448*   Cardiac Enzymes: No results for input(s): CKTOTAL, CKMB, CKMBINDEX, TROPONINI in the last 168 hours. BNP (last 3 results) No results for input(s): BNP in the last 8760 hours.  ProBNP (last 3  results) No results for input(s): PROBNP in the last 8760 hours.  CBG:  Recent Labs Lab 06/30/14 1936 06/30/14 2059 06/30/14 2314 07/01/14 0313 07/01/14 0806  GLUCAP 132* 115* 137* 162* 108*    Recent Results (from the past 240 hour(s))  Surgical pcr  screen     Status: Abnormal   Collection Time: 06/24/14  5:43 AM  Result Value Ref Range Status   MRSA, PCR NEGATIVE NEGATIVE Final   Staphylococcus aureus POSITIVE (A) NEGATIVE Final    Comment:        The Xpert SA Assay (FDA approved for NASAL specimens in patients over 69 years of age), is one component of a comprehensive surveillance program.  Test performance has been validated by [facility] for patients greater than or equal to 49 year old. It is not intended to diagnose infection nor to guide or monitor treatment. RESULT CALLED TO, READ BACK BY AND VERIFIED WITH: Elberta Leatherwood RN [phone] GREEN R   Anaerobic culture     Status: None   Collection Time: 06/24/14 12:57 PM  Result Value Ref Range Status   Specimen Description TISSUE NECK  Final   Special Requests NONE  Final   Gram Stain   Final    NO WBC SEEN FEW GRAM POSITIVE COCCI IN PAIRS Performed at [facility]    Culture   Final    NO ANAEROBES ISOLATED Performed at [facility]    Report Status 06/29/2014 FINAL  Final  Culture, routine-abscess     Status: None   Collection Time: 06/24/14 12:57 PM  Result Value Ref Range Status   Specimen Description ABSCESS NECK  Final   Special Requests NONE  Final   Gram Stain   Final    RARE WBC PRESENT,BOTH PMN AND MONONUCLEAR MODERATE GRAM POSITIVE COCCI IN PAIRS IN CLUSTERS Performed at [facility]    Culture   Final    ABUNDANT STAPHYLOCOCCUS AUREUS Note: RIFAMPIN AND GENTAMICIN SHOULD NOT BE USED AS SINGLE DRUGS FOR TREATMENT OF STAPH INFECTIONS. Performed at [facility]    Report Status 06/27/2014 FINAL  Final   Organism ID, Bacteria STAPHYLOCOCCUS AUREUS  Final       Susceptibility   Staphylococcus aureus - MIC*    CLINDAMYCIN <=0.25 SENSITIVE Sensitive     ERYTHROMYCIN <=0.25 SENSITIVE Sensitive     GENTAMICIN <=0.5 SENSITIVE Sensitive     LEVOFLOXACIN 0.25 SENSITIVE Sensitive     OXACILLIN 0.5 SENSITIVE Sensitive     PENICILLIN >=0.5 RESISTANT Resistant     RIFAMPIN <=0.5 SENSITIVE Sensitive     TRIMETH/SULFA <=10 SENSITIVE Sensitive     VANCOMYCIN <=0.5 SENSITIVE Sensitive     TETRACYCLINE <=1 SENSITIVE Sensitive     MOXIFLOXACIN <=0.25 SENSITIVE Sensitive     * ABUNDANT STAPHYLOCOCCUS AUREUS  MRSA PCR Screening     Status: None   Collection Time: 06/24/14  3:15 PM  Result Value Ref Range Status   MRSA by PCR NEGATIVE NEGATIVE Final    Comment:        The GeneXpert MRSA Assay (FDA approved for NASAL specimens only), is one component of a comprehensive MRSA colonization surveillance program. It is not intended to diagnose MRSA infection nor to guide or monitor treatment for MRSA infections.   Culture, blood (routine x 2)     Status: None   Collection Time: 06/24/14  4:50 PM  Result Value Ref Range Status   Specimen Description BLOOD RIGHT ANTECUBITAL  Final   Special Requests BOTTLES DRAWN AEROBIC ONLY 10CC  Final   Culture   Final    NO GROWTH 5 DAYS Performed at [facility]    Report Status 06/30/2014 FINAL  Final  Culture, blood (routine x 2)     Status: None  Collection Time: 06/24/14  4:55 PM  Result Value Ref Range Status   Specimen Description BLOOD RIGHT HAND  Final   Special Requests BOTTLES DRAWN AEROBIC ONLY 10CC  Final   Culture   Final    NO GROWTH 5 DAYS Performed at [facility]    Report Status 06/30/2014 FINAL  Final     Studies: No results found.  Scheduled Meds: . antiseptic oral rinse  7 mL Mouth Rinse BID  . ARIPiprazole  5 mg Oral q morning - 10a  .  ceFAZolin (ANCEF) IV  2 g Intravenous 3 times per day  . DULoxetine  90 mg Oral Daily  . enoxaparin (LOVENOX) injection  40  mg Subcutaneous Q24H  . gabapentin  600 mg Oral TID  . insulin aspart  0-20 Units Subcutaneous Q4H  . potassium chloride  40 mEq Oral Once  . senna-docusate  1 tablet Oral BID  . tamsulosin  0.4 mg Oral QPC supper   Continuous Infusions: . sodium chloride 10 mL/hr at 06/30/14 2155    Active Problems:   Type 2 diabetes mellitus with peripheral neuropathy   Diabetic neuropathy   MDD (major depressive disorder)   Cellulitis   Staphylococcal sepsis   Hyponatremia   Acute respiratory acidosis   Sepsis   Abscess or cellulitis, neck   Cellulitis, neck   Acute respiratory failure with hypoxia    Time spent: 30 min    Jamelia Varano, [facility]  [facility] Pager [phone]. If 7PM-7AM, please contact night-coverage at [email or web address], password [facility] 07/01/2014, 10:12 AM  LOS: 12 days

## 2014-07-01 NOTE — Consult Note (Addendum)
Reason for Consult:neck wound Referring Physician: Dr. Ezzard Standing is an 54 y.o. male.  HPI: The patient is a Eric Shaw here for treatment of his neck wound.  He noticed some swelling and tenderness in the posterior neck region several days ago.  It quickly developed into a significant and seriously infected wound.  He was taken to the OR for debridement with ENT and a VAC was placed.  We were consulted for further wound care management.  He has diabetes and hypertension that are contributing to his poor health at the present.  The area is granulating well and does not show signs of gross infection at this time.    Past Medical History  Diagnosis Date  . Plantar fasciitis, bilateral   . Hypertension   . High cholesterol   . Type II diabetes mellitus   . Sleep apnea     "not suppose to wear mask" (06/19/2014)  . Arthritis     "all 10 toes" (06/19/2014)  . Depression     Past Surgical History  Procedure Laterality Date  . Reduction of torsion of testis Left 1979  . Incision and drainage abscess N/A 06/24/2014    Procedure: INCISION AND DRAINAGE OF POSTERIOR NECK ABCESS;  Surgeon: Izora Gala, MD;  Location: [place];  Service: ENT;  Laterality: N/A;  . Wound debridement N/A 06/24/2014    Procedure: DEBRIDEMENT OF NECROTIZING FASCITIS ON POSTERIOR NECK;  Surgeon: Izora Gala, MD;  Location: [place];  Service: ENT;  Laterality: N/A;  . Incision and drainage abscess N/A 06/26/2014    Procedure: REPEAT DEBRIEDMENT OF NECK  WOUND;  Surgeon: Izora Gala, MD;  Location: [place];  Service: ENT;  Laterality: N/A;  . Incision and drainage abscess N/A 06/28/2014    Procedure:  REPEAT DEBRIEDMENT OF NECK  WOUND;  Surgeon: Izora Gala, MD;  Location: [place];  Service: ENT;  Laterality: N/A;    History reviewed. No pertinent family history.  Social History:  reports that he has been smoking Cigarettes.  He has a 36 pack-year smoking history. He has never used smokeless tobacco. He reports that he  drinks about 1.2 oz of alcohol per week. He reports that he does not use illicit drugs.  Allergies: No Known Allergies  Medications: I have reviewed the patient's current medications.  Results for orders placed or performed during the hospital encounter of 06/19/14 (from the past 48 hour(s))  Glucose, capillary     Status: Abnormal   Collection Time: 06/29/14 11:31 AM  Result Value Ref Range   Glucose-Capillary 155 (H) 70 - 99 mg/dL  Glucose, capillary     Status: Abnormal   Collection Time: 06/29/14  3:37 PM  Result Value Ref Range   Glucose-Capillary 172 (H) 70 - 99 mg/dL  Glucose, capillary     Status: Abnormal   Collection Time: 06/29/14  7:29 PM  Result Value Ref Range   Glucose-Capillary 162 (H) 70 - 99 mg/dL  Glucose, capillary     Status: Abnormal   Collection Time: 06/29/14 11:12 PM  Result Value Ref Range   Glucose-Capillary 160 (H) 70 - 99 mg/dL  Glucose, capillary     Status: Abnormal   Collection Time: 06/30/14  3:47 AM  Result Value Ref Range   Glucose-Capillary 118 (H) 70 - 99 mg/dL   Comment 1 Capillary Specimen   Glucose, capillary     Status: Abnormal   Collection Time: 06/30/14  8:02 AM  Result Value Ref Range  Glucose-Capillary 155 (H) 70 - 99 mg/dL   Comment 1 Capillary Specimen   Glucose, capillary     Status: Abnormal   Collection Time: 06/30/14 12:54 PM  Result Value Ref Range   Glucose-Capillary 180 (H) 70 - 99 mg/dL   Comment 1 Notify RN   Glucose, capillary     Status: Abnormal   Collection Time: 06/30/14  5:21 PM  Result Value Ref Range   Glucose-Capillary 179 (H) 70 - 99 mg/dL   Comment 1 Capillary Specimen   Glucose, capillary     Status: Abnormal   Collection Time: 06/30/14  7:36 PM  Result Value Ref Range   Glucose-Capillary 132 (H) 70 - 99 mg/dL  Glucose, capillary     Status: Abnormal   Collection Time: 06/30/14  8:59 PM  Result Value Ref Range   Glucose-Capillary 115 (H) 70 - 99 mg/dL  Glucose, capillary     Status: Abnormal    Collection Time: 06/30/14 11:14 PM  Result Value Ref Range   Glucose-Capillary 137 (H) 70 - 99 mg/dL  Glucose, capillary     Status: Abnormal   Collection Time: 07/01/14  3:13 AM  Result Value Ref Range   Glucose-Capillary 162 (H) 70 - 99 mg/dL  Basic metabolic panel     Status: Abnormal   Collection Time: 07/01/14  5:50 AM  Result Value Ref Range   Sodium 139 135 - 145 mmol/L   Potassium 3.4 (L) 3.5 - 5.1 mmol/L   Chloride 98 96 - 112 mmol/L   CO2 34 (H) 19 - 32 mmol/L   Glucose, Bld 94 70 - 99 mg/dL   BUN 8 6 - 23 mg/dL   Creatinine, Ser 0.60 0.50 - 1.35 mg/dL   Calcium 8.3 (L) 8.4 - 10.5 mg/dL   GFR calc non Af Amer >90 >90 mL/min   GFR calc Af Amer >90 >90 mL/min    Comment: (NOTE) The eGFR has been calculated using the CKD EPI equation. This calculation has not been validated in all clinical situations. eGFR's persistently <90 mL/min signify possible Chronic Kidney Disease.    Anion gap 7 5 - 15  CBC     Status: Abnormal   Collection Time: 07/01/14  5:50 AM  Result Value Ref Range   WBC 9.0 4.0 - 10.5 K/uL   RBC 3.49 (L) 4.22 - 5.81 MIL/uL   Hemoglobin 10.4 (L) 13.0 - 17.0 g/dL   HCT 32.5 (L) 39.0 - 52.0 %   MCV 93.1 78.0 - 100.0 fL   MCH 29.8 26.0 - 34.0 pg   MCHC 32.0 30.0 - 36.0 g/dL   RDW 13.9 11.5 - 15.5 %   Platelets 448 (H) 150 - 400 K/uL  Glucose, capillary     Status: Abnormal   Collection Time: 07/01/14  8:06 AM  Result Value Ref Range   Glucose-Capillary 108 (H) 70 - 99 mg/dL    No results found.  Review of Systems  Constitutional: Positive for fever.  Eyes: Negative.   Respiratory: Negative.   Cardiovascular: Negative.   Gastrointestinal: Negative.   Genitourinary: Negative.   Musculoskeletal: Negative.   Neurological: Positive for headaches.  Psychiatric/Behavioral: Negative.    Blood pressure 116/54, pulse 83, temperature 97.8 F (36.6 C), temperature source Oral, resp. rate 16, height 5' 10"  (1.778 m), weight 91.6 kg (201 lb 15.1 oz),  SpO2 100 %. Physical Exam  Constitutional: He is oriented to person, place, and time. He appears well-developed.  HENT:  Head: Normocephalic.  Eyes: Conjunctivae  are normal. Pupils are equal, round, and reactive to light.  Cardiovascular: Normal rate.   Respiratory: Effort normal.  Musculoskeletal:       Back:  Neurological: He is alert and oriented to person, place, and time.  Psychiatric: He has a normal mood and affect. His behavior is normal. Judgment and thought content normal.    Assessment/Plan: Due to the massive size and location, we strongly recommend further washout, Acell and VAC placement.  We will participate in his care and coordinate with ENT. All was explained to the patient. Recommend Multivitamin daily, Vit C 500 mg daily and zinc 220 mg daily.  Nutrition and diabetic educator recommended.  Increase protein with supplements like Glucerna.  [place] 07/01/2014, 10:07 AM

## 2014-07-01 NOTE — Progress Notes (Addendum)
Pt has not voided since his arrival to unit  and denies urge  for void. Bladder scan done with >526 ml residual. On-call provider  Benedetto Coons Callahan, NP  Notified via text page . Order received for In and Out cath. Will monitor. At 0100, In and out cath  done with 420 cc urine output.

## 2014-07-02 DIAGNOSIS — M726 Necrotizing fasciitis: Secondary | ICD-10-CM

## 2014-07-02 DIAGNOSIS — J9601 Acute respiratory failure with hypoxia: Secondary | ICD-10-CM | POA: Diagnosis not present

## 2014-07-02 DIAGNOSIS — E1140 Type 2 diabetes mellitus with diabetic neuropathy, unspecified: Secondary | ICD-10-CM | POA: Diagnosis not present

## 2014-07-02 LAB — GLUCOSE, CAPILLARY
GLUCOSE-CAPILLARY: 185 mg/dL — AB (ref 70–99)
Glucose-Capillary: 202 mg/dL — ABNORMAL HIGH (ref 70–99)
Glucose-Capillary: 147 mg/dL — ABNORMAL HIGH (ref 70–99)
Glucose-Capillary: 170 mg/dL — ABNORMAL HIGH (ref 70–99)

## 2014-07-02 MED ORDER — SACCHAROMYCES BOULARDII 250 MG PO CAPS
250.0000 mg | ORAL_CAPSULE | Freq: Two times a day (BID) | ORAL | Status: DC
  Administered 2014-07-02 – 2014-07-06 (×9): 250 mg via ORAL
  Filled 2014-07-02 (×10): qty 1

## 2014-07-02 NOTE — Progress Notes (Signed)
[facility] PROGRESS NOTE  Eric Shaw ZOX:[id number] DOB: Apr 09, 1961 DOA: 06/19/2014 PCP: No PCP Per Patient  Brief Summary  The patient is a 54 year old male with history of type 2 diabetes mellitus with hemoglobin A1c of 9.8, depression with suicidal ideations previously requiring admission to behavioral health, morbid obesity, hypertension, who presented to the emergency department with progressively worsening neck pain, swelling, redness, tenderness. He had noticed some swelling about 1 week prior to admission and got progressively worse. He denied fever, chills, but did complain that he felt generally weak and had not been able to eat or take his medications. He was found to have necrotizing fasciitis of the left posterior neck. He was started on broad-spectrum antibiotics and seen by ENT. On 2/20 he was taken to the operating room for debridement of abscess and necrotizing fasciitis. He was intubated for the procedure and he remained intubated for another few days secondary to airway edema. He was extubated on 2/24 and passed a swallowing evaluation on 2/25. He has a wound VAC in place and is being monitored daily by ENT. Cultures have grown MSSA.  STUDIES:  2/19 CT neck - severe cellulitis involving the posterior soft tissues of the occiput and upper cervical spine. Edema extends into the trapezius and semispinalis capitis musculature. 2/19 CT neck - Progressive posterior cervical/occipital cellulitis and myositis There is a mix of phlegmon and abscess in the bilateral posterior cervical space and left paraspinal compartment, as above. Stable mild retropharyngeal edema.  SIGNIFICANT EVENTS: 2/20 OR, abscess drainage, findings of nec fasc, left on vent, airway difficult 2/22 pos balance 2/23- int prom on pcxr  Assessment/Plan  MSSA necrotizing fasciitis of the left neck status post debridement on 2/20  -  Wound vac with good seal -  BCx2 2/15:  No growth -  Neck abscess 2/20:   MSSA -  Ancef started 2/20 and plan to continue through next debridement -  Start florastor  -  Appreciate ENT/plastic management of wound -  plan for further washout, acell and VAC placement on Wednesday  Acute hypoxic respiratory failure secondary to interstitial and airway edema post intubation resolved with steroids to assist with extubation.  Diabetes mellitus type 2, uncontrolled, hemoglobin A1c 9.8 -  CBGs mildly elevated -  Continue to hold metformin -  Patient on ACE inhibitor -  Continue high dose SSI  Major Depression, stable -  Resume Abilify, Cymbalta, gabapentin  Urinary retention -  Continue flomax -  Day 2 of 3 of foley  -  Voiding trial tomorrow  Diet:  Diabetic diet Access:  PIV IVF:  Off Proph:  SCDs  Code Status: Full Family Communication: Patient alone Disposition Plan: Pending weaning oxygen, reevaluation by ENT/plastic surgery.     Consultants:  Critical care  ENT, Dr. Pollyann Kennedy  Procedures: 2/20 OR, abscess drainage, findings of nec fasc, left on vent, airway difficult 2/22 pos balance 2/23- int prom on pcxr  Antibiotics:  vancomcyin 2/16 > 2/17  Ancef 2/17 >>>  Zosyn 2/16 > 2/17  cipro  2/16 > 2/17  HPI/Subjective:  Minimal pain, off oxygen and feeling well.  Difficulty sitting in chair because dressing and wound vac push his head forward some  Objective: Filed Vitals:   07/01/14 1358 07/01/14 2212 07/02/14 0527 07/02/14 0527  BP: 151/86 125/62  143/83  Pulse:  86  87  Temp: 98.4 F (36.9 C) 98.3 F (36.8 C)  98.2 F (36.8 C)  TempSrc: Oral Oral  Oral  Resp: 16 18  20  Height:      Weight:   92.715 kg (204 lb 6.4 oz)   SpO2: 98% 99%  97%    Intake/Output Summary (Last 24 hours) at 07/02/14 1245 Last data filed at 07/02/14 1028  Gross per 24 hour  Intake  931.5 ml  Output   1275 ml  Net -343.5 ml   Filed Weights   06/30/14 2023 07/01/14 0422 07/02/14 0527  Weight: 90.81 kg (200 lb 3.2 oz) 91.6 kg (201 lb 15.1 oz)  92.715 kg (204 lb 6.4 oz)    Exam:   General:  Adult male, No acute distress, on room air  HEENT:  MMM, wound VAC in place over left posterior neck with good air seal  Cardiovascular:  RRR, nl S1, S2 no mrg, 2+ pulses, warm extremities  Respiratory:  CTAB, no increased WOB  Abdomen:   NABS, soft, NT/ND  MSK:   Normal tone and bulk, no LEE  Neuro:  Grossly intact  Data Reviewed: Basic Metabolic Panel:  Recent Labs Lab 06/25/14 1459 06/26/14 0259 06/26/14 0500 06/26/14 1635 06/27/14 0250 06/27/14 0400 06/28/14 0730 06/29/14 0415 07/01/14 0550  NA  --   --  137  --   --  137 136 135 139  K  --   --  3.6  --   --  3.6 3.7 3.9 3.4*  CL  --   --  104  --   --  103 96 96 98  CO2  --   --  29  --   --  31 30 32 34*  GLUCOSE  --   --  112*  --   --  115* 145* 116* 94  BUN  --   --  7  --   --  7 13 8 8   CREATININE  --   --  0.53  --   --  0.54 0.51 0.56 0.60  CALCIUM  --   --  7.9*  --   --  8.0* 8.3* 8.4 8.3*  MG 1.9 1.7  --  1.6 1.6  --  1.9  --   --   PHOS 2.7 2.6  --  3.0 3.6  --  3.1  --   --    Liver Function Tests:  Recent Labs Lab 06/26/14 0500  AST 16  ALT 12  ALKPHOS 125*  BILITOT 0.4  PROT 5.4*  ALBUMIN 1.5*   No results for input(s): LIPASE, AMYLASE in the last 168 hours. No results for input(s): AMMONIA in the last 168 hours. CBC:  Recent Labs Lab 06/26/14 0500 06/27/14 0400 06/28/14 0730 06/29/14 0415 07/01/14 0550  WBC 12.3* 11.4* 10.8* 15.6* 9.0  NEUTROABS 9.0*  --   --   --   --   HGB 10.9* 10.5* 10.3* 10.1* 10.4*  HCT 32.6* 31.8* 31.7* 30.4* 32.5*  MCV 90.6 92.7 91.6 91.3 93.1  PLT 382 391 391 426* 448*   Cardiac Enzymes: No results for input(s): CKTOTAL, CKMB, CKMBINDEX, TROPONINI in the last 168 hours. BNP (last 3 results) No results for input(s): BNP in the last 8760 hours.  ProBNP (last 3 results) No results for input(s): PROBNP in the last 8760 hours.  CBG:  Recent Labs Lab 07/01/14 1152 07/01/14 1734 07/01/14 2209  07/02/14 0819 07/02/14 1155  GLUCAP 220* 151* 171* 185* 202*    Recent Results (from the past 240 hour(s))  Surgical pcr screen     Status: Abnormal   Collection Time: 06/24/14  5:43  AM  Result Value Ref Range Status   MRSA, PCR NEGATIVE NEGATIVE Final   Staphylococcus aureus POSITIVE (A) NEGATIVE Final    Comment:        The Xpert SA Assay (FDA approved for NASAL specimens in patients over 8 years of age), is one component of a comprehensive surveillance program.  Test performance has been validated by [facility] for patients greater than or equal to 68 year old. It is not intended to diagnose infection nor to guide or monitor treatment. RESULT CALLED TO, READ BACK BY AND VERIFIED WITH: Elberta Leatherwood RN [phone] GREEN R   Anaerobic culture     Status: None   Collection Time: 06/24/14 12:57 PM  Result Value Ref Range Status   Specimen Description TISSUE NECK  Final   Special Requests NONE  Final   Gram Stain   Final    NO WBC SEEN FEW GRAM POSITIVE COCCI IN PAIRS Performed at [facility]    Culture   Final    NO ANAEROBES ISOLATED Performed at [facility]    Report Status 06/29/2014 FINAL  Final  Culture, routine-abscess     Status: None   Collection Time: 06/24/14 12:57 PM  Result Value Ref Range Status   Specimen Description ABSCESS NECK  Final   Special Requests NONE  Final   Gram Stain   Final    RARE WBC PRESENT,BOTH PMN AND MONONUCLEAR MODERATE GRAM POSITIVE COCCI IN PAIRS IN CLUSTERS Performed at [facility]    Culture   Final    ABUNDANT STAPHYLOCOCCUS AUREUS Note: RIFAMPIN AND GENTAMICIN SHOULD NOT BE USED AS SINGLE DRUGS FOR TREATMENT OF STAPH INFECTIONS. Performed at [facility]    Report Status 06/27/2014 FINAL  Final   Organism ID, Bacteria STAPHYLOCOCCUS AUREUS  Final      Susceptibility   Staphylococcus aureus - MIC*    CLINDAMYCIN <=0.25 SENSITIVE Sensitive     ERYTHROMYCIN <=0.25 SENSITIVE  Sensitive     GENTAMICIN <=0.5 SENSITIVE Sensitive     LEVOFLOXACIN 0.25 SENSITIVE Sensitive     OXACILLIN 0.5 SENSITIVE Sensitive     PENICILLIN >=0.5 RESISTANT Resistant     RIFAMPIN <=0.5 SENSITIVE Sensitive     TRIMETH/SULFA <=10 SENSITIVE Sensitive     VANCOMYCIN <=0.5 SENSITIVE Sensitive     TETRACYCLINE <=1 SENSITIVE Sensitive     MOXIFLOXACIN <=0.25 SENSITIVE Sensitive     * ABUNDANT STAPHYLOCOCCUS AUREUS  MRSA PCR Screening     Status: None   Collection Time: 06/24/14  3:15 PM  Result Value Ref Range Status   MRSA by PCR NEGATIVE NEGATIVE Final    Comment:        The GeneXpert MRSA Assay (FDA approved for NASAL specimens only), is one component of a comprehensive MRSA colonization surveillance program. It is not intended to diagnose MRSA infection nor to guide or monitor treatment for MRSA infections.   Culture, blood (routine x 2)     Status: None   Collection Time: 06/24/14  4:50 PM  Result Value Ref Range Status   Specimen Description BLOOD RIGHT ANTECUBITAL  Final   Special Requests BOTTLES DRAWN AEROBIC ONLY 10CC  Final   Culture   Final    NO GROWTH 5 DAYS Performed at [facility]    Report Status 06/30/2014 FINAL  Final  Culture, blood (routine x 2)     Status: None   Collection Time: 06/24/14  4:55 PM  Result Value Ref Range Status  Specimen Description BLOOD RIGHT HAND  Final   Special Requests BOTTLES DRAWN AEROBIC ONLY 10CC  Final   Culture   Final    NO GROWTH 5 DAYS Performed at [facility]    Report Status 06/30/2014 FINAL  Final     Studies: No results found.  Scheduled Meds: . antiseptic oral rinse  7 mL Mouth Rinse BID  . ARIPiprazole  5 mg Oral q morning - 10a  .  ceFAZolin (ANCEF) IV  2 g Intravenous 3 times per day  . DULoxetine  90 mg Oral Daily  . enoxaparin (LOVENOX) injection  40 mg Subcutaneous Q24H  . feeding supplement (GLUCERNA SHAKE)  237 mL Oral BID BM  . gabapentin  600 mg Oral TID  . insulin aspart   0-20 Units Subcutaneous TID WC  . insulin aspart  0-5 Units Subcutaneous QHS  . senna-docusate  1 tablet Oral BID  . tamsulosin  0.4 mg Oral QPC supper   Continuous Infusions: . sodium chloride 10 mL/hr at 06/30/14 2155    Principal Problem:   Necrotizing fasciitis Active Problems:   Type 2 diabetes mellitus with peripheral neuropathy   Diabetic neuropathy   MDD (major depressive disorder)   Cellulitis   Staphylococcal sepsis   Hyponatremia   Acute respiratory acidosis   Sepsis   Abscess or cellulitis, neck   Cellulitis, neck   Acute respiratory failure with hypoxia    Time spent: 30 min    Veneta Sliter, [facility]  [facility] Pager [phone]. If 7PM-7AM, please contact night-coverage at [email or web address], password [facility] 07/02/2014, 12:45 PM  LOS: 13 days

## 2014-07-03 DIAGNOSIS — R338 Other retention of urine: Secondary | ICD-10-CM

## 2014-07-03 DIAGNOSIS — E1140 Type 2 diabetes mellitus with diabetic neuropathy, unspecified: Secondary | ICD-10-CM | POA: Diagnosis not present

## 2014-07-03 DIAGNOSIS — M726 Necrotizing fasciitis: Secondary | ICD-10-CM | POA: Diagnosis not present

## 2014-07-03 LAB — GLUCOSE, CAPILLARY
GLUCOSE-CAPILLARY: 128 mg/dL — AB (ref 70–99)
Glucose-Capillary: 154 mg/dL — ABNORMAL HIGH (ref 70–99)
Glucose-Capillary: 186 mg/dL — ABNORMAL HIGH (ref 70–99)
Glucose-Capillary: 209 mg/dL — ABNORMAL HIGH (ref 70–99)

## 2014-07-03 MED ORDER — MORPHINE SULFATE 2 MG/ML IJ SOLN
2.0000 mg | INTRAMUSCULAR | Status: DC | PRN
  Administered 2014-07-03: 2 mg via INTRAVENOUS
  Filled 2014-07-03: qty 1

## 2014-07-03 NOTE — Progress Notes (Signed)
[facility] PROGRESS NOTE  Eric Shaw ZOX:[id number] DOB: [birth year] DOA: 06/19/2014 PCP: No PCP Per Patient  Brief Summary  The patient is a 54 year old male with history of type 2 diabetes mellitus with hemoglobin A1c of 9.8, depression with suicidal ideations previously requiring admission to behavioral health, morbid obesity, hypertension, who presented to the emergency department with progressively worsening neck pain, swelling, redness, tenderness. He had noticed some swelling about 1 week prior to admission and got progressively worse. He denied fever, chills, but did complain that he felt generally weak and had not been able to eat or take his medications. He was found to have necrotizing fasciitis of the left posterior neck. He was started on broad-spectrum antibiotics and seen by ENT. On 2/20 he was taken to the operating room for debridement of abscess and necrotizing fasciitis. He was intubated for the procedure and he remained intubated for another few days secondary to airway edema. He was extubated on 2/24 and passed a swallowing evaluation on 2/25. He has a wound VAC in place and is being monitored daily by ENT. Cultures have grown MSSA.  STUDIES:  2/19 CT neck - severe cellulitis involving the posterior soft tissues of the occiput and upper cervical spine. Edema extends into the trapezius and semispinalis capitis musculature. 2/19 CT neck - Progressive posterior cervical/occipital cellulitis and myositis There is a mix of phlegmon and abscess in the bilateral posterior cervical space and left paraspinal compartment, as above. Stable mild retropharyngeal edema.  SIGNIFICANT EVENTS: 2/20 OR, abscess drainage, findings of nec fasc, left on vent, airway difficult 2/22 pos balance 2/23- int prom on pcxr  Assessment/Plan  MSSA necrotizing fasciitis of the left neck status post debridement on 2/20  -  Wound vac exchanged today -  BCx2 2/15:  No growth -  Neck abscess  2/20:  MSSA -  Ancef started 2/20 and plan to continue through next debridement -  Started florastor  -  Appreciate ENT/plastic management of wound -  plan for further washout, acell and VAC placement on Wednesday  Acute hypoxic respiratory failure secondary to interstitial and airway edema post intubation resolved with steroids to assist with extubation.  Diabetes mellitus type 2, uncontrolled, hemoglobin A1c 9.8 -  CBGs at goal -  Continue to hold metformin -  Patient on ACE inhibitor -  Continue high dose SSI  Major Depression, stable -  Continue Abilify, Cymbalta, gabapentin  Urinary retention -  Continue flomax -  Day 3 of 3 of foley  -  Voiding trial today  Diet:  Diabetic diet Access:  PIV IVF:  Off Proph:  SCDs  Code Status: Full Family Communication: Patient alone Disposition Plan: Pending weaning oxygen, reevaluation by ENT/plastic surgery.     Consultants:  Critical care  ENT, Dr. Pollyann Kennedy  Procedures: 2/20 OR, abscess drainage, findings of nec fasc, left on vent, airway difficult 2/22 pos balance 2/23- int prom on pcxr  Antibiotics:  vancomcyin 2/16 > 2/17  Ancef 2/17 >>>  Zosyn 2/16 > 2/17  cipro  2/16 > 2/17  HPI/Subjective:  Feeling excited about procedure Wed.  Anxious to get catheter out.  Had some pain mid-VAC exchange, now resolved.    Objective: Filed Vitals:   07/02/14 1351 07/02/14 2137 07/03/14 0531 07/03/14 0535  BP: 153/92 157/86  135/69  Pulse: 90 88  90  Temp: 97.8 F (36.6 C) 97.8 F (36.6 C)  97.8 F (36.6 C)  TempSrc: Oral Oral  Oral  Resp: 18 18  20  Height:      Weight:   92.262 kg (203 lb 6.4 oz)   SpO2: 100% 99%  100%    Intake/Output Summary (Last 24 hours) at 07/03/14 1041 Last data filed at 07/03/14 1022  Gross per 24 hour  Intake 1631.16 ml  Output   1905 ml  Net -273.84 ml   Filed Weights   07/01/14 0422 07/02/14 0527 07/03/14 0531  Weight: 91.6 kg (201 lb 15.1 oz) 92.715 kg (204 lb 6.4 oz) 92.262 kg  (203 lb 6.4 oz)    Exam:   General:  Adult male, No acute distress, on room air  HEENT:  MMM, wound VAC in place over left posterior neck with good air seal   Cardiovascular:  RRR, nl S1, S2 no mrg, warm extremities  Respiratory:  CTAB, no increased WOB  MSK:   Normal tone and bulk, no LEE  Neuro:  Grossly intact  Data Reviewed: Basic Metabolic Panel:  Recent Labs Lab 06/26/14 1635 06/27/14 0250 06/27/14 0400 06/28/14 0730 06/29/14 0415 07/01/14 0550  NA  --   --  137 136 135 139  K  --   --  3.6 3.7 3.9 3.4*  CL  --   --  103 96 96 98  CO2  --   --  31 30 32 34*  GLUCOSE  --   --  115* 145* 116* 94  BUN  --   --  CREATININE  --   --  0.54 0.51 0.56 0.60  CALCIUM  --   --  8.0* 8.3* 8.4 8.3*  MG 1.6 1.6  --  1.9  --   --   PHOS 3.0 3.6  --  3.1  --   --    Liver Function Tests: No results for input(s): AST, ALT, ALKPHOS, BILITOT, PROT, ALBUMIN in the last 168 hours. No results for input(s): LIPASE, AMYLASE in the last 168 hours. No results for input(s): AMMONIA in the last 168 hours. CBC:  Recent Labs Lab 06/27/14 0400 06/28/14 0730 06/29/14 0415 07/01/14 0550  WBC 11.4* 10.8* 15.6* 9.0  HGB 10.5* 10.3* 10.1* 10.4*  HCT 31.8* 31.7* 30.4* 32.5*  MCV 92.7 91.6 91.3 93.1  PLT 391 391 426* 448*   Cardiac Enzymes: No results for input(s): CKTOTAL, CKMB, CKMBINDEX, TROPONINI in the last 168 hours. BNP (last 3 results) No results for input(s): BNP in the last 8760 hours.  ProBNP (last 3 results) No results for input(s): PROBNP in the last 8760 hours.  CBG:  Recent Labs Lab 07/02/14 0819 07/02/14 1155 07/02/14 1657 07/02/14 2140 07/03/14 0822  GLUCAP 185* 202* 147* 170* 154*    Recent Results (from the past 240 hour(s))  Surgical pcr screen     Status: Abnormal   Collection Time: 06/24/14  5:43 AM  Result Value Ref Range Status   MRSA, PCR NEGATIVE NEGATIVE Final   Staphylococcus aureus POSITIVE (A) NEGATIVE Final    Comment:         The Xpert SA Assay (FDA approved for NASAL specimens in patients over 6 years of age), is one component of a comprehensive surveillance program.  Test performance has been validated by [facility] for patients greater than or equal to 6 year old. It is not intended to diagnose infection nor to guide or monitor treatment. RESULT CALLED TO, READ BACK BY AND VERIFIED WITH: Elberta Leatherwood RN [phone] GREEN R   Anaerobic culture     Status: None  Collection Time: 06/24/14 12:57 PM  Result Value Ref Range Status   Specimen Description TISSUE NECK  Final   Special Requests NONE  Final   Gram Stain   Final    NO WBC SEEN FEW GRAM POSITIVE COCCI IN PAIRS Performed at [facility]    Culture   Final    NO ANAEROBES ISOLATED Performed at [facility]    Report Status 06/29/2014 FINAL  Final  Culture, routine-abscess     Status: None   Collection Time: 06/24/14 12:57 PM  Result Value Ref Range Status   Specimen Description ABSCESS NECK  Final   Special Requests NONE  Final   Gram Stain   Final    RARE WBC PRESENT,BOTH PMN AND MONONUCLEAR MODERATE GRAM POSITIVE COCCI IN PAIRS IN CLUSTERS Performed at [facility]    Culture   Final    ABUNDANT STAPHYLOCOCCUS AUREUS Note: RIFAMPIN AND GENTAMICIN SHOULD NOT BE USED AS SINGLE DRUGS FOR TREATMENT OF STAPH INFECTIONS. Performed at [facility]    Report Status 06/27/2014 FINAL  Final   Organism ID, Bacteria STAPHYLOCOCCUS AUREUS  Final      Susceptibility   Staphylococcus aureus - MIC*    CLINDAMYCIN <=0.25 SENSITIVE Sensitive     ERYTHROMYCIN <=0.25 SENSITIVE Sensitive     GENTAMICIN <=0.5 SENSITIVE Sensitive     LEVOFLOXACIN 0.25 SENSITIVE Sensitive     OXACILLIN 0.5 SENSITIVE Sensitive     PENICILLIN >=0.5 RESISTANT Resistant     RIFAMPIN <=0.5 SENSITIVE Sensitive     TRIMETH/SULFA <=10 SENSITIVE Sensitive     VANCOMYCIN <=0.5 SENSITIVE Sensitive     TETRACYCLINE <=1 SENSITIVE Sensitive      MOXIFLOXACIN <=0.25 SENSITIVE Sensitive     * ABUNDANT STAPHYLOCOCCUS AUREUS  MRSA PCR Screening     Status: None   Collection Time: 06/24/14  3:15 PM  Result Value Ref Range Status   MRSA by PCR NEGATIVE NEGATIVE Final    Comment:        The GeneXpert MRSA Assay (FDA approved for NASAL specimens only), is one component of a comprehensive MRSA colonization surveillance program. It is not intended to diagnose MRSA infection nor to guide or monitor treatment for MRSA infections.   Culture, blood (routine x 2)     Status: None   Collection Time: 06/24/14  4:50 PM  Result Value Ref Range Status   Specimen Description BLOOD RIGHT ANTECUBITAL  Final   Special Requests BOTTLES DRAWN AEROBIC ONLY 10CC  Final   Culture   Final    NO GROWTH 5 DAYS Performed at [facility]    Report Status 06/30/2014 FINAL  Final  Culture, blood (routine x 2)     Status: None   Collection Time: 06/24/14  4:55 PM  Result Value Ref Range Status   Specimen Description BLOOD RIGHT HAND  Final   Special Requests BOTTLES DRAWN AEROBIC ONLY 10CC  Final   Culture   Final    NO GROWTH 5 DAYS Performed at [facility]    Report Status 06/30/2014 FINAL  Final     Studies: No results found.  Scheduled Meds: . antiseptic oral rinse  7 mL Mouth Rinse BID  . ARIPiprazole  5 mg Oral q morning - 10a  .  ceFAZolin (ANCEF) IV  2 g Intravenous 3 times per day  . DULoxetine  90 mg Oral Daily  . enoxaparin (LOVENOX) injection  40 mg Subcutaneous Q24H  . feeding supplement (GLUCERNA SHAKE)  237 mL Oral BID BM  . gabapentin  600 mg Oral TID  . insulin aspart  0-20 Units Subcutaneous TID WC  . insulin aspart  0-5 Units Subcutaneous QHS  . saccharomyces boulardii  250 mg Oral BID  . senna-docusate  1 tablet Oral BID  . tamsulosin  0.4 mg Oral QPC supper   Continuous Infusions: . sodium chloride 10 mL/hr at 06/30/14 2155    Principal Problem:   Necrotizing fasciitis Active Problems:    Type 2 diabetes mellitus with peripheral neuropathy   Diabetic neuropathy   MDD (major depressive disorder)   Cellulitis   Staphylococcal sepsis   Hyponatremia   Acute respiratory acidosis   Sepsis   Abscess or cellulitis, neck   Cellulitis, neck   Acute respiratory failure with hypoxia    Time spent: 30 min    Eric Shaw, [email or web address]  [facility] Pager [phone]. If 7PM-7AM, please contact night-coverage at [email or web address], password [email or web address] 07/03/2014, 10:41 AM  LOS: 14 days

## 2014-07-03 NOTE — Consult Note (Signed)
WOC wound follow up Pt medicated for pain prior to Vac dressing change.  Wound type: Full thickness post-op wound to posterior neck Wound bed: Beefy red with some yellow interspersed, visible muscles and tendons Drainage (amount, consistency, odor) Mod amt pink drainage in the cannister, no odor Periwound: Intact skin surrounding Dressing procedure/placement/frequency: Mepitel contact layer to decrease discomfort, and one piece black sponge to 100mm cont suction. Pt tolerated with mod amt discomfort. Plans to have Vac dressing changed in the OR on Wed according to progress notes. Cammie Mcgeeawn Daysy Santini MSN, RN, CWOCN, [facility]age[facility]CN-AP, CNS [phone]

## 2014-07-03 NOTE — Progress Notes (Addendum)
Patient has voided twice (last at 2200), scanned bladder and post residual is 298 ml left over. Will give opportunity to void and rescan later  642- Scanned bladder this am amount greater than 520 ml. Patient stated that Eric Shaw voided at 200 last. Feels no urge to go at this time. Refused catheter. Wants extra time to void. Will continue to monitor.

## 2014-07-03 NOTE — Progress Notes (Signed)
Pt foley out, will carry out resuduals per md request

## 2014-07-04 ENCOUNTER — Ambulatory Visit: Payer: Self-pay | Admitting: Otolaryngology

## 2014-07-04 DIAGNOSIS — M726 Necrotizing fasciitis: Secondary | ICD-10-CM | POA: Diagnosis not present

## 2014-07-04 DIAGNOSIS — R338 Other retention of urine: Secondary | ICD-10-CM | POA: Diagnosis not present

## 2014-07-04 DIAGNOSIS — E1140 Type 2 diabetes mellitus with diabetic neuropathy, unspecified: Secondary | ICD-10-CM | POA: Diagnosis not present

## 2014-07-04 LAB — GLUCOSE, CAPILLARY
GLUCOSE-CAPILLARY: 147 mg/dL — AB (ref 70–99)
GLUCOSE-CAPILLARY: 205 mg/dL — AB (ref 70–99)
Glucose-Capillary: 133 mg/dL — ABNORMAL HIGH (ref 70–99)
Glucose-Capillary: 253 mg/dL — ABNORMAL HIGH (ref 70–99)

## 2014-07-04 NOTE — Progress Notes (Addendum)
[facility] PROGRESS NOTE  Eric Shaw WUJ:[id number] DOB: 08/28/1960 DOA: 06/19/2014 PCP: No PCP Per Patient  Brief Summary  The patient is a 54 year old male with history of type 2 diabetes mellitus with hemoglobin A1c of 9.8, depression with suicidal ideations previously requiring admission to behavioral health, morbid obesity, hypertension, who presented to the emergency department with progressively worsening neck pain, swelling, redness, tenderness. He had noticed some swelling about 1 week prior to admission and got progressively worse. He denied fever, chills, but did complain that he felt generally weak and had not been able to eat or take his medications. He was found to have necrotizing fasciitis of the left posterior neck. He was started on broad-spectrum antibiotics and seen by ENT. On 2/20 he was taken to the operating room for debridement of abscess and necrotizing fasciitis. He was intubated for the procedure and he remained intubated for another few days secondary to airway edema. He was extubated on 2/24 and passed a swallowing evaluation on 2/25. He has a wound VAC in place and is being monitored daily by ENT. Cultures have grown MSSA.  He is going back to the OR for another washout, acell and wound vac placement on 3/2.  Anticipate discharge soon thereafter.  Course complicate by urinary retention.  STUDIES:  2/19 CT neck - severe cellulitis involving the posterior soft tissues of the occiput and upper cervical spine. Edema extends into the trapezius and semispinalis capitis musculature. 2/19 CT neck - Progressive posterior cervical/occipital cellulitis and myositis There is a mix of phlegmon and abscess in the bilateral posterior cervical space and left paraspinal compartment, as above. Stable mild retropharyngeal edema.  SIGNIFICANT EVENTS: 2/20 OR, abscess drainage, findings of nec fasc, left on vent, airway difficult 2/22 pos balance 2/23- int prom on  pcxr  Assessment/Plan  MSSA necrotizing fasciitis of the left neck status post debridement on 2/20  -  Wound vac last exchanged on 2/29 -  BCx2 2/15:  No growth final -  Neck abscess 2/20:  MSSA -  Ancef started 2/20 and plan to continue through next debridement -  Started florastor  -  Appreciate ENT/plastic management of wound -  plan for further washout, acell and VAC placement on Wednesday  Acute hypoxic respiratory failure secondary to interstitial and airway edema post intubation resolved with steroids to assist with extubation.  Diabetes mellitus type 2, uncontrolled, hemoglobin A1c 9.8 -  CBGs still at goal -  Continue to hold metformin -  Patient on ACE inhibitor -  Continue high dose SSI  Major Depression, stable -  Continue Abilify, Cymbalta, gabapentin  Urinary retention, still retaining urine but able to void.   -  Continue flomax -  Replace foley if unable to void  Diet:  Diabetic diet Access:  PIV IVF:  Off Proph:  SCDs  Code Status: Full Family Communication: Patient alone Disposition Plan:  Pending surgery on Wednesday   Consultants:  Critical care  ENT, Dr. Pollyann Kennedy  Plastic surgery, Dr. Kelly Splinter  Procedures: 2/20 OR, abscess drainage, findings of nec fasc, left on vent, airway difficult 2/22 pos balance 2/23- int prom on pcxr  Antibiotics:  vancomcyin 2/16 > 2/17  Ancef 2/17 >>>  Zosyn 2/16 > 2/17  cipro  2/16 > 2/17  HPI/Subjective:  Feeling excited about procedure Wed.  Denies chills, worsening neck pain, abdominal pain.  Still making some urine.   Objective: Filed Vitals:   07/03/14 1355 07/03/14 2000 07/03/14 2000 07/04/14 0501  BP: 132/75  152/60  141/72  Pulse: 91  88 89  Temp: 98.3 F (36.8 C)  98.1 F (36.7 C) 98 F (36.7 C)  TempSrc: Oral  Oral Oral  Resp: 16 17 18 18   Height:      Weight:    92.3 kg (203 lb 7.8 oz)  SpO2: 98%  100% 100%    Intake/Output Summary (Last 24 hours) at 07/04/14 0749 Last data filed at  07/03/14 2301  Gross per 24 hour  Intake    240 ml  Output   1640 ml  Net  -1400 ml   Filed Weights   07/02/14 0527 07/03/14 0531 07/04/14 0501  Weight: 92.715 kg (204 lb 6.4 oz) 92.262 kg (203 lb 6.4 oz) 92.3 kg (203 lb 7.8 oz)    Exam:   General:  Adult male, No acute distress, on room air, lying on side in bed  HEENT:  MMM, wound VAC in place over left posterior neck with good air seal   Cardiovascular:  RRR, nl S1, S2 no mrg, warm extremities  Respiratory:  CTAB, no increased WOB  MSK:   Normal tone and bulk, no LEE  Neuro:  Grossly intact  Data Reviewed: Basic Metabolic Panel:  Recent Labs Lab 06/28/14 0730 06/29/14 0415 07/01/14 0550  NA 136 135 139  K 3.7 3.9 3.4*  CL 96 96 98  CO2 30 32 34*  GLUCOSE 145* 116* 94  BUN 13 8 8   CREATININE 0.51 0.56 0.60  CALCIUM 8.3* 8.4 8.3*  MG 1.9  --   --   PHOS 3.1  --   --    Liver Function Tests: No results for input(s): AST, ALT, ALKPHOS, BILITOT, PROT, ALBUMIN in the last 168 hours. No results for input(s): LIPASE, AMYLASE in the last 168 hours. No results for input(s): AMMONIA in the last 168 hours. CBC:  Recent Labs Lab 06/28/14 0730 06/29/14 0415 07/01/14 0550  WBC 10.8* 15.6* 9.0  HGB 10.3* 10.1* 10.4*  HCT 31.7* 30.4* 32.5*  MCV 91.6 91.3 93.1  PLT 391 426* 448*   Cardiac Enzymes: No results for input(s): CKTOTAL, CKMB, CKMBINDEX, TROPONINI in the last 168 hours. BNP (last 3 results) No results for input(s): BNP in the last 8760 hours.  ProBNP (last 3 results) No results for input(s): PROBNP in the last 8760 hours.  CBG:  Recent Labs Lab 07/03/14 0822 07/03/14 1133 07/03/14 1711 07/03/14 2153 07/04/14 0737  GLUCAP 154* 186* 128* 209* 147*    Recent Results (from the past 240 hour(s))  Anaerobic culture     Status: None   Collection Time: 06/24/14 12:57 PM  Result Value Ref Range Status   Specimen Description TISSUE NECK  Final   Special Requests NONE  Final   Gram Stain   Final     NO WBC SEEN FEW GRAM POSITIVE COCCI IN PAIRS Performed at [facility]    Culture   Final    NO ANAEROBES ISOLATED Performed at [facility]    Report Status 06/29/2014 FINAL  Final  Culture, routine-abscess     Status: None   Collection Time: 06/24/14 12:57 PM  Result Value Ref Range Status   Specimen Description ABSCESS NECK  Final   Special Requests NONE  Final   Gram Stain   Final    RARE WBC PRESENT,BOTH PMN AND MONONUCLEAR MODERATE GRAM POSITIVE COCCI IN PAIRS IN CLUSTERS Performed at [facility]    Culture   Final    ABUNDANT STAPHYLOCOCCUS AUREUS Note:  RIFAMPIN AND GENTAMICIN SHOULD NOT BE USED AS SINGLE DRUGS FOR TREATMENT OF STAPH INFECTIONS. Performed at [facility]    Report Status 06/27/2014 FINAL  Final   Organism ID, Bacteria STAPHYLOCOCCUS AUREUS  Final      Susceptibility   Staphylococcus aureus - MIC*    CLINDAMYCIN <=0.25 SENSITIVE Sensitive     ERYTHROMYCIN <=0.25 SENSITIVE Sensitive     GENTAMICIN <=0.5 SENSITIVE Sensitive     LEVOFLOXACIN 0.25 SENSITIVE Sensitive     OXACILLIN 0.5 SENSITIVE Sensitive     PENICILLIN >=0.5 RESISTANT Resistant     RIFAMPIN <=0.5 SENSITIVE Sensitive     TRIMETH/SULFA <=10 SENSITIVE Sensitive     VANCOMYCIN <=0.5 SENSITIVE Sensitive     TETRACYCLINE <=1 SENSITIVE Sensitive     MOXIFLOXACIN <=0.25 SENSITIVE Sensitive     * ABUNDANT STAPHYLOCOCCUS AUREUS  MRSA PCR Screening     Status: None   Collection Time: 06/24/14  3:15 PM  Result Value Ref Range Status   MRSA by PCR NEGATIVE NEGATIVE Final    Comment:        The GeneXpert MRSA Assay (FDA approved for NASAL specimens only), is one component of a comprehensive MRSA colonization surveillance program. It is not intended to diagnose MRSA infection nor to guide or monitor treatment for MRSA infections.   Culture, blood (routine x 2)     Status: None   Collection Time: 06/24/14  4:50 PM  Result Value Ref Range Status    Specimen Description BLOOD RIGHT ANTECUBITAL  Final   Special Requests BOTTLES DRAWN AEROBIC ONLY 10CC  Final   Culture   Final    NO GROWTH 5 DAYS Performed at [facility]    Report Status 06/30/2014 FINAL  Final  Culture, blood (routine x 2)     Status: None   Collection Time: 06/24/14  4:55 PM  Result Value Ref Range Status   Specimen Description BLOOD RIGHT HAND  Final   Special Requests BOTTLES DRAWN AEROBIC ONLY 10CC  Final   Culture   Final    NO GROWTH 5 DAYS Performed at [facility]    Report Status 06/30/2014 FINAL  Final     Studies: No results found.  Scheduled Meds: . antiseptic oral rinse  7 mL Mouth Rinse BID  . ARIPiprazole  5 mg Oral q morning - 10a  .  ceFAZolin (ANCEF) IV  2 g Intravenous 3 times per day  . DULoxetine  90 mg Oral Daily  . enoxaparin (LOVENOX) injection  40 mg Subcutaneous Q24H  . feeding supplement (GLUCERNA SHAKE)  237 mL Oral BID BM  . gabapentin  600 mg Oral TID  . insulin aspart  0-20 Units Subcutaneous TID WC  . insulin aspart  0-5 Units Subcutaneous QHS  . saccharomyces boulardii  250 mg Oral BID  . senna-docusate  1 tablet Oral BID  . tamsulosin  0.4 mg Oral QPC supper   Continuous Infusions: . sodium chloride 10 mL/hr at 06/30/14 2155    Principal Problem:   Necrotizing fasciitis Active Problems:   Type 2 diabetes mellitus with peripheral neuropathy   Diabetic neuropathy   MDD (major depressive disorder)   Cellulitis   Staphylococcal sepsis   Hyponatremia   Acute respiratory acidosis   Sepsis   Abscess or cellulitis, neck   Cellulitis, neck   Acute respiratory failure with hypoxia   Acute urinary retention    Time spent: 30 min    Clare Casto  [facility] Pager  [phone]. If 7PM-7AM, please contact night-coverage at [email or web address], password [facility] 07/04/2014, 7:49 AM  LOS: 15 days

## 2014-07-04 NOTE — Plan of Care (Signed)
Problem: Phase III Progression Outcomes Goal: IV/normal saline lock discontinued Outcome: Completed/Met Date Met:  07/04/14 IV fluids KVO

## 2014-07-05 ENCOUNTER — Inpatient Hospital Stay (HOSPITAL_COMMUNITY): Payer: MEDICAID | Admitting: Anesthesiology

## 2014-07-05 ENCOUNTER — Encounter (HOSPITAL_COMMUNITY)
Admission: EM | Disposition: A | Discharge status: Home Health | Payer: Self-pay | Source: Home / Self Care | Attending: Internal Medicine

## 2014-07-05 ENCOUNTER — Inpatient Hospital Stay (HOSPITAL_COMMUNITY): Payer: Self-pay | Admitting: Anesthesiology

## 2014-07-05 ENCOUNTER — Encounter (HOSPITAL_COMMUNITY): Payer: Self-pay | Admitting: Certified Registered Nurse Anesthetist

## 2014-07-05 DIAGNOSIS — M726 Necrotizing fasciitis: Secondary | ICD-10-CM | POA: Diagnosis not present

## 2014-07-05 HISTORY — PX: APPLICATION OF A-CELL OF HEAD/NECK: SHX6304

## 2014-07-05 HISTORY — PX: INCISION AND DRAINAGE OF WOUND: SHX1803

## 2014-07-05 HISTORY — PX: APPLICATION OF WOUND VAC: SHX5189

## 2014-07-05 LAB — CBC
HCT: 35.6 % — ABNORMAL LOW (ref 39.0–52.0)
HEMOGLOBIN: 11.7 g/dL — AB (ref 13.0–17.0)
MCH: 30.3 pg (ref 26.0–34.0)
MCHC: 32.9 g/dL (ref 30.0–36.0)
MCV: 92.2 fL (ref 78.0–100.0)
Platelets: 500 10*3/uL — ABNORMAL HIGH (ref 150–400)
RBC: 3.86 MIL/uL — ABNORMAL LOW (ref 4.22–5.81)
RDW: 14.1 % (ref 11.5–15.5)
WBC: 7.3 10*3/uL (ref 4.0–10.5)

## 2014-07-05 LAB — GLUCOSE, CAPILLARY
GLUCOSE-CAPILLARY: 173 mg/dL — AB (ref 70–99)
GLUCOSE-CAPILLARY: 226 mg/dL — AB (ref 70–99)
Glucose-Capillary: 182 mg/dL — ABNORMAL HIGH (ref 70–99)
Glucose-Capillary: 184 mg/dL — ABNORMAL HIGH (ref 70–99)
Glucose-Capillary: 166 mg/dL — ABNORMAL HIGH (ref 70–99)

## 2014-07-05 LAB — PREALBUMIN: Prealbumin: 23.2 mg/dL (ref 17.0–34.0)

## 2014-07-05 LAB — SURGICAL PCR SCREEN
MRSA, PCR: NEGATIVE
STAPHYLOCOCCUS AUREUS: NEGATIVE

## 2014-07-05 LAB — BASIC METABOLIC PANEL
Anion gap: 8 (ref 5–15)
BUN: 6 mg/dL (ref 6–23)
CHLORIDE: 100 mmol/L (ref 96–112)
CO2: 27 mmol/L (ref 19–32)
Calcium: 9 mg/dL (ref 8.4–10.5)
Creatinine, Ser: 0.56 mg/dL (ref 0.50–1.35)
GFR calc Af Amer: >90 mL/min (ref >90)
GFR calc non Af Amer: >90 mL/min (ref >90)
Glucose, Bld: 163 mg/dL — ABNORMAL HIGH (ref 70–99)
Potassium: 4 mmol/L (ref 3.5–5.1)
SODIUM: 135 mmol/L (ref 135–145)

## 2014-07-05 SURGERY — APPLICATION, WOUND VAC
Anesthesia: General | Site: Neck

## 2014-07-05 MED ORDER — PHENYLEPHRINE 40 MCG/ML (10ML) SYRINGE FOR IV PUSH (FOR BLOOD PRESSURE SUPPORT)
PREFILLED_SYRINGE | INTRAVENOUS | Status: AC
  Filled 2014-07-05: qty 10

## 2014-07-05 MED ORDER — GLYCOPYRROLATE 0.2 MG/ML IJ SOLN
INTRAMUSCULAR | Status: AC
  Filled 2014-07-05: qty 1

## 2014-07-05 MED ORDER — PROPOFOL 10 MG/ML IV BOLUS
INTRAVENOUS | Status: DC | PRN
  Administered 2014-07-05: 20 mg via INTRAVENOUS
  Administered 2014-07-05: 120 mg via INTRAVENOUS
  Administered 2014-07-05: 60 mg via INTRAVENOUS

## 2014-07-05 MED ORDER — BACITRACIN ZINC 500 UNIT/GM EX OINT
TOPICAL_OINTMENT | CUTANEOUS | Status: AC
  Filled 2014-07-05: qty 28.35

## 2014-07-05 MED ORDER — PHENYLEPHRINE HCL 10 MG/ML IJ SOLN
INTRAMUSCULAR | Status: DC | PRN
  Administered 2014-07-05 (×5): 80 ug via INTRAVENOUS
  Administered 2014-07-05 (×2): 40 ug via INTRAVENOUS
  Administered 2014-07-05: 80 ug via INTRAVENOUS

## 2014-07-05 MED ORDER — HYDROMORPHONE HCL 1 MG/ML IJ SOLN: 0.2500 mg | INTRAMUSCULAR | Status: DC | PRN

## 2014-07-05 MED ORDER — GLYCOPYRROLATE 0.2 MG/ML IJ SOLN
INTRAMUSCULAR | Status: DC | PRN
  Administered 2014-07-05 (×2): 0.1 mg via INTRAVENOUS

## 2014-07-05 MED ORDER — FENTANYL CITRATE 0.05 MG/ML IJ SOLN
INTRAMUSCULAR | Status: DC | PRN
  Administered 2014-07-05: 100 ug via INTRAVENOUS
  Administered 2014-07-05: 150 ug via INTRAVENOUS

## 2014-07-05 MED ORDER — ONDANSETRON HCL 4 MG/2ML IJ SOLN
INTRAMUSCULAR | Status: AC
Start: 2014-07-05 — End: 2014-07-05
  Filled 2014-07-05: qty 2

## 2014-07-05 MED ORDER — CEFAZOLIN SODIUM-DEXTROSE 2-3 GM-% IV SOLR
2.0000 g | Freq: Three times a day (TID) | INTRAVENOUS | Status: DC
  Administered 2014-07-05 – 2014-07-06 (×3): 2 g via INTRAVENOUS
  Filled 2014-07-05 (×5): qty 50

## 2014-07-05 MED ORDER — SUCCINYLCHOLINE CHLORIDE 20 MG/ML IJ SOLN
INTRAMUSCULAR | Status: DC | PRN
  Administered 2014-07-05: 140 mg via INTRAVENOUS

## 2014-07-05 MED ORDER — LIDOCAINE HCL (CARDIAC) 20 MG/ML IV SOLN
INTRAVENOUS | Status: AC
  Filled 2014-07-05: qty 10

## 2014-07-05 MED ORDER — ARTIFICIAL TEARS OP OINT
TOPICAL_OINTMENT | OPHTHALMIC | Status: AC
  Filled 2014-07-05: qty 3.5

## 2014-07-05 MED ORDER — LACTATED RINGERS IV SOLN
INTRAVENOUS | Status: DC
  Administered 2014-07-05: 12:00:00 via INTRAVENOUS

## 2014-07-05 MED ORDER — ONDANSETRON HCL 4 MG/2ML IJ SOLN: 4.0000 mg | Freq: Once | INTRAMUSCULAR | Status: AC | PRN

## 2014-07-05 MED ORDER — CEFAZOLIN SODIUM-DEXTROSE 2-3 GM-% IV SOLR
2.0000 g | Freq: Three times a day (TID) | INTRAVENOUS | Status: DC
Start: 2014-07-05 — End: 2014-07-05
  Filled 2014-07-05 (×2): qty 50

## 2014-07-05 MED ORDER — EPHEDRINE SULFATE 50 MG/ML IJ SOLN
INTRAMUSCULAR | Status: DC | PRN
  Administered 2014-07-05: 10 mg via INTRAVENOUS
  Administered 2014-07-05 (×3): 5 mg via INTRAVENOUS

## 2014-07-05 MED ORDER — PROPOFOL 10 MG/ML IV BOLUS
INTRAVENOUS | Status: AC
  Filled 2014-07-05: qty 20

## 2014-07-05 MED ORDER — MEPERIDINE HCL 25 MG/ML IJ SOLN: 6.2500 mg | INTRAMUSCULAR | Status: DC | PRN

## 2014-07-05 MED ORDER — MIDAZOLAM HCL 2 MG/2ML IJ SOLN
INTRAMUSCULAR | Status: AC
  Filled 2014-07-05: qty 2

## 2014-07-05 MED ORDER — SUCCINYLCHOLINE CHLORIDE 20 MG/ML IJ SOLN
INTRAMUSCULAR | Status: AC
  Filled 2014-07-05: qty 1

## 2014-07-05 MED ORDER — 0.9 % SODIUM CHLORIDE (POUR BTL) OPTIME
TOPICAL | Status: DC | PRN
  Administered 2014-07-05: 1000 mL

## 2014-07-05 MED ORDER — FENTANYL CITRATE 0.05 MG/ML IJ SOLN
INTRAMUSCULAR | Status: AC
  Filled 2014-07-05: qty 5

## 2014-07-05 MED ORDER — LACTATED RINGERS IV SOLN
INTRAVENOUS | Status: DC | PRN
  Administered 2014-07-05 (×2): via INTRAVENOUS

## 2014-07-05 MED ORDER — MIDAZOLAM HCL 5 MG/5ML IJ SOLN
INTRAMUSCULAR | Status: DC | PRN
  Administered 2014-07-05: 2 mg via INTRAVENOUS

## 2014-07-05 MED ORDER — ONDANSETRON HCL 4 MG/2ML IJ SOLN
INTRAMUSCULAR | Status: DC | PRN
  Administered 2014-07-05: 4 mg via INTRAVENOUS

## 2014-07-05 MED ORDER — LIDOCAINE HCL 4 % MT SOLN
OROMUCOSAL | Status: DC | PRN
  Administered 2014-07-05: 4 mL via TOPICAL

## 2014-07-05 MED ORDER — LIDOCAINE-EPINEPHRINE 1 %-1:100000 IJ SOLN
INTRAMUSCULAR | Status: AC
  Filled 2014-07-05: qty 1

## 2014-07-05 MED ORDER — SODIUM CHLORIDE 0.9 % IR SOLN
Status: DC | PRN
  Administered 2014-07-05: 500 mL

## 2014-07-05 SURGICAL SUPPLY — 60 items
ATTRACTOMAT 16X20 MAGNETIC DRP (DRAPES) IMPLANT
BLADE SURG 10 STRL SS (BLADE) ×8 IMPLANT
BNDG CONFORM 2 STRL LF (GAUZE/BANDAGES/DRESSINGS) IMPLANT
BNDG GAUZE ELAST 4 BULKY (GAUZE/BANDAGES/DRESSINGS) IMPLANT
CANISTER SUCTION 2500CC (MISCELLANEOUS) IMPLANT
CATH ROBINSON RED A/P 16FR (CATHETERS) IMPLANT
CLEANER TIP ELECTROSURG 2X2 (MISCELLANEOUS) ×4 IMPLANT
CONT SPEC 4OZ CLIKSEAL STRL BL (MISCELLANEOUS) IMPLANT
COVER SURGICAL LIGHT HANDLE (MISCELLANEOUS) ×4 IMPLANT
DRAIN PENROSE 1/4X12 LTX STRL (WOUND CARE) IMPLANT
DRAPE INCISE IOBAN 66X45 STRL (DRAPES) ×4 IMPLANT
DRSG ADAPTIC 3X8 NADH LF (GAUZE/BANDAGES/DRESSINGS) ×4 IMPLANT
DRSG EMULSION OIL 3X3 NADH (GAUZE/BANDAGES/DRESSINGS) ×4 IMPLANT
DRSG VAC ATS MED SENSATRAC (GAUZE/BANDAGES/DRESSINGS) ×4 IMPLANT
ELECT COATED BLADE 2.86 ST (ELECTRODE) ×4 IMPLANT
ELECT NEEDLE TIP 2.8 STRL (NEEDLE) ×4 IMPLANT
ELECT REM PT RETURN 9FT ADLT (ELECTROSURGICAL) ×4
ELECTRODE REM PT RTRN 9FT ADLT (ELECTROSURGICAL) ×2 IMPLANT
GAUZE PACKING IODOFORM 1/4X5 (PACKING) IMPLANT
GAUZE SPONGE 4X4 12PLY STRL (GAUZE/BANDAGES/DRESSINGS) IMPLANT
GAUZE SPONGE 4X4 16PLY XRAY LF (GAUZE/BANDAGES/DRESSINGS) ×4 IMPLANT
GLOVE BIO SURGEON STRL SZ 6.5 (GLOVE) ×9 IMPLANT
GLOVE BIO SURGEONS STRL SZ 6.5 (GLOVE) ×3
GLOVE BIOGEL PI IND STRL 6.5 (GLOVE) ×4 IMPLANT
GLOVE BIOGEL PI INDICATOR 6.5 (GLOVE) ×4
GLOVE ECLIPSE 6.5 STRL STRAW (GLOVE) ×8 IMPLANT
GLOVE ECLIPSE 7.5 STRL STRAW (GLOVE) ×4 IMPLANT
GOWN STRL REUS W/ TWL LRG LVL3 (GOWN DISPOSABLE) ×6 IMPLANT
GOWN STRL REUS W/TWL LRG LVL3 (GOWN DISPOSABLE) ×6
KIT BASIN OR (CUSTOM PROCEDURE TRAY) ×4 IMPLANT
KIT ROOM TURNOVER OR (KITS) ×4 IMPLANT
MATRIX SURGICAL PSM 7X10CM (Tissue) ×4 IMPLANT
MICROMATRIX 1000MG (Tissue) ×8 IMPLANT
MICROMATRIX 500MG (Tissue) ×8 IMPLANT
NEEDLE HYPO 30X.5 LL (NEEDLE) IMPLANT
NS IRRIG 1000ML POUR BTL (IV SOLUTION) ×4 IMPLANT
PAD ARMBOARD 7.5X6 YLW CONV (MISCELLANEOUS) ×8 IMPLANT
PENCIL FOOT CONTROL (ELECTRODE) ×4 IMPLANT
POUCH STERILIZING 3 X22 (STERILIZATION PRODUCTS) IMPLANT
SOLUTION BETADINE 4OZ (MISCELLANEOUS) ×4 IMPLANT
SOLUTION PARTIC MCRMTRX 1000MG (Tissue) ×4 IMPLANT
SOLUTION PARTIC MCRMTRX 500MG (Tissue) ×4 IMPLANT
STAPLER VISISTAT 35W (STAPLE) ×4 IMPLANT
SURGILUBE 2OZ TUBE FLIPTOP (MISCELLANEOUS) ×4 IMPLANT
SUT CHROMIC 4 0 P 3 18 (SUTURE) IMPLANT
SUT ETHILON 4 0 PS 2 18 (SUTURE) IMPLANT
SUT ETHILON 5 0 P 3 18 (SUTURE)
SUT MON AB 5-0 PS2 18 (SUTURE) ×4 IMPLANT
SUT NYLON ETHILON 5-0 P-3 1X18 (SUTURE) IMPLANT
SUT SILK 4 0 REEL (SUTURE) IMPLANT
SUT VIC AB 5-0 PS2 18 (SUTURE) ×4 IMPLANT
SWAB COLLECTION DEVICE MRSA (MISCELLANEOUS) IMPLANT
SYR BULB IRRIGATION 50ML (SYRINGE) IMPLANT
SYR CONTROL 10ML LL (SYRINGE) IMPLANT
TOWEL OR 17X24 6PK STRL BLUE (TOWEL DISPOSABLE) ×4 IMPLANT
TOWEL OR 17X26 10 PK STRL BLUE (TOWEL DISPOSABLE) ×4 IMPLANT
TRAY ENT MC OR (CUSTOM PROCEDURE TRAY) ×4 IMPLANT
TUBE ANAEROBIC SPECIMEN COL (MISCELLANEOUS) IMPLANT
WATER STERILE IRR 1000ML POUR (IV SOLUTION) ×4 IMPLANT
YANKAUER SUCT BULB TIP NO VENT (SUCTIONS) IMPLANT

## 2014-07-05 NOTE — Progress Notes (Signed)
[facility] PROGRESS NOTE  Eric CraneJon Shaw ZOX:[id number] DOB: [birth year] DOA: 06/19/2014 PCP: No PCP Per Patient  Brief Summary  The patient is a 54 year old male with history of type 2 diabetes mellitus with hemoglobin A1c of 9.8, depression with suicidal ideations previously requiring admission to behavioral health, morbid obesity, hypertension, who presented to the emergency department with progressively worsening neck pain, swelling, redness, tenderness. He had noticed some swelling about 1 week prior to admission and got progressively worse. He denied fever, chills, but did complain that he felt generally weak and had not been able to eat or take his medications. He was found to have necrotizing fasciitis of the left posterior neck. He was started on broad-spectrum antibiotics and seen by ENT. On 2/20 he was taken to the operating room for debridement of abscess and necrotizing fasciitis. He was intubated for the procedure and he remained intubated for another few days secondary to airway edema. He was extubated on 2/24 and passed a swallowing evaluation on 2/25. He has a wound VAC in place and is being monitored daily by ENT. Cultures have grown MSSA. He underwent another irrigation today and wound vac placed.   STUDIES:  2/19 CT neck - severe cellulitis involving the posterior soft tissues of the occiput and upper cervical spine. Edema extends into the trapezius and semispinalis capitis musculature. 2/19 CT neck - Progressive posterior cervical/occipital cellulitis and myositis There is a mix of phlegmon and abscess in the bilateral posterior cervical space and left paraspinal compartment, as above. Stable mild retropharyngeal edema.  SIGNIFICANT EVENTS: 2/20 OR, abscess drainage, findings of nec fasc, left on vent, airway difficult 2/22 pos balance 2/23- int prom on pcxr  Assessment/Plan  MSSA necrotizing fasciitis of the left neck status post debridement on 2/20  -  Wound vac  last exchanged on 2/29 -  BCx2 2/15:  No growth final -  Neck abscess 2/20:  MSSA -  Ancef started 2/20 and plan to continue through next debridement -  Started florastor  -  Appreciate ENT/plastic management of wound - s/p application of wound vac and irrigation and debridement of the wound and recommend another day of IV ANCED.   Acute hypoxic respiratory failure secondary to interstitial and airway edema post intubation resolved with steroids to assist with extubation.  Diabetes mellitus type 2, uncontrolled, hemoglobin A1c 9.8 CBG (last 3)   Recent Labs  07/05/14 1153 07/05/14 1407 07/05/14 1708  GLUCAP 173* 184* 166*    Continue with SSI. He will need insulin on discharge.   Major Depression, stable -  Continue Abilify, Cymbalta, gabapentin  Urinary retention, still retaining urine but able to void.   -  Continue flomax   Diet:  Diabetic diet Access:  PIV IVF:  Off Proph:  SCDs  Code Status: Full Family Communication: Patient alone Disposition Plan:  Possibly home tomorrow.    Consultants:  Critical care  ENT, Dr. Pollyann Kennedyosen  Plastic surgery, Dr. Kelly SplinterSanger  Procedures: 2/20 OR, abscess drainage, findings of nec fasc, left on vent, airway difficult 2/22 pos balance 2/23- int prom on pcxr  Antibiotics:  vancomcyin 2/16 > 2/17  Ancef 2/17 >>>  Zosyn 2/16 > 2/17  cipro  2/16 > 2/17  HPI/Subjective:  Slightly drowsy, no new complaints.   Objective: Filed Vitals:   07/05/14 1646 07/05/14 1701 07/05/14 1731 07/05/14 1746  BP: 104/56 94/45 123/80 119/85  Pulse: 107 108 103 109  Temp:      TempSrc:      Resp:  Height:      Weight:      SpO2: 100% 98% 100% 99%    Intake/Output Summary (Last 24 hours) at 07/05/14 1923 Last data filed at 07/05/14 1610  Gross per 24 hour  Intake   1360 ml  Output   1600 ml  Net   -240 ml   Filed Weights   07/03/14 0531 07/04/14 0501 07/05/14 0551  Weight: 92.262 kg (203 lb 6.4 oz) 92.3 kg (203 lb 7.8 oz) 93  kg (205 lb 0.4 oz)    Exam:   General:  Adult male, No acute distress, on room air, with wound vac placed on the back of th eneck.   Cardiovascular:  RRR, nl S1, S2 no mrg, warm extremities  Respiratory:  Clear to auscultation, no wheezing .   MSK:   Normal tone and bulk, no LEE  Gastrointestinal: soft non tender non distended bowel sounds.    Data Reviewed: Basic Metabolic Panel:  Recent Labs Lab 06/29/14 0415 07/01/14 0550 07/05/14 0525  NA 135 139 135  K 3.9 3.4* 4.0  CL 96 98 100  CO2 32 34* 27  GLUCOSE 116* 94 163*  BUN CREATININE 0.56 0.60 0.56  CALCIUM 8.4 8.3* 9.0   Liver Function Tests: No results for input(s): AST, ALT, ALKPHOS, BILITOT, PROT, ALBUMIN in the last 168 hours. No results for input(s): LIPASE, AMYLASE in the last 168 hours. No results for input(s): AMMONIA in the last 168 hours. CBC:  Recent Labs Lab 06/29/14 0415 07/01/14 0550 07/05/14 0525  WBC 15.6* 9.0 7.3  HGB 10.1* 10.4* 11.7*  HCT 30.4* 32.5* 35.6*  MCV 91.3 93.1 92.2  PLT 426* 448* 500*   Cardiac Enzymes: No results for input(s): CKTOTAL, CKMB, CKMBINDEX, TROPONINI in the last 168 hours. BNP (last 3 results) No results for input(s): BNP in the last 8760 hours.  ProBNP (last 3 results) No results for input(s): PROBNP in the last 8760 hours.  CBG:  Recent Labs Lab 07/04/14 2109 07/05/14 0813 07/05/14 1153 07/05/14 1407 07/05/14 1708  GLUCAP 253* 182* 173* 184* 166*    Recent Results (from the past 240 hour(s))  Surgical pcr screen     Status: None   Collection Time: 07/05/14 12:32 AM  Result Value Ref Range Status   MRSA, PCR NEGATIVE NEGATIVE Final   Staphylococcus aureus NEGATIVE NEGATIVE Final    Comment:        The Xpert SA Assay (FDA approved for NASAL specimens in patients over 28 years of age), is one component of a comprehensive surveillance program.  Test performance has been validated by [facility] for patients greater than or equal  to 56 year old. It is not intended to diagnose infection nor to guide or monitor treatment.      Studies: No results found.  Scheduled Meds: . antiseptic oral rinse  7 mL Mouth Rinse BID  . ARIPiprazole  5 mg Oral q morning - 10a  .  ceFAZolin (ANCEF) IV  2 g Intravenous 3 times per day  . DULoxetine  90 mg Oral Daily  . enoxaparin (LOVENOX) injection  40 mg Subcutaneous Q24H  . feeding supplement (GLUCERNA SHAKE)  237 mL Oral BID BM  . gabapentin  600 mg Oral TID  . insulin aspart  0-20 Units Subcutaneous TID WC  . insulin aspart  0-5 Units Subcutaneous QHS  . saccharomyces boulardii  250 mg Oral BID  . senna-docusate  1 tablet Oral BID  . tamsulosin  0.4 mg Oral QPC supper   Continuous Infusions: . sodium chloride 10 mL/hr at 06/30/14 2155  . lactated ringers 10 mL/hr at 07/05/14 1155    Principal Problem:   Necrotizing fasciitis Active Problems:   Type 2 diabetes mellitus with peripheral neuropathy   Diabetic neuropathy   MDD (major depressive disorder)   Cellulitis   Staphylococcal sepsis   Hyponatremia   Acute respiratory acidosis   Sepsis   Abscess or cellulitis, neck   Cellulitis, neck   Acute respiratory failure with hypoxia   Acute urinary retention    Time spent: 20 min    Almeda Ezra  [facility] Pager [phone]. If 7PM-7AM, please contact night-coverage at [email or web address], password [facility] 07/05/2014, 7:23 PM  LOS: 16 days

## 2014-07-05 NOTE — Op Note (Signed)
Operative Note   DATE OF OPERATION: 07/05/2014  [facility]  SURGICAL DIVISION: Plastic Surgery  PREOPERATIVE DIAGNOSES:  Neck infection / wound  POSTOPERATIVE DIAGNOSES:  same  PROCEDURE:  Preparation of neck wound for placement of Acell 3 gm and (7 x 10 cm sheet) and VAC after debridement of skin, subcutaneous tissue and muscle (7 x 12 x 1 cm)  SURGEON: Dr. Pollyann Kennedyosen  ASSISTANT: Alan Ripperlaire Sanger, DO  ANESTHESIA:  General.   COMPLICATIONS: None.   INDICATIONS FOR PROCEDURE:  The patient, Eric Shaw is a 54 y.o. male born on Jul 24, 1960, is here for treatment of a large neck ulcer from infection.  He was debrided several times by ENT and we were consulted for co-care of the patient. MRN: [id number]  CONSENT:  Informed consent was obtained directly from the patient. Risks, benefits and alternatives were fully discussed. Specific risks including but not limited to bleeding, infection, hematoma, seroma, scarring, pain, infection, contracture, asymmetry, wound healing problems, and need for further surgery were all discussed. The patient did have an ample opportunity to have questions answered to satisfaction.   DESCRIPTION OF PROCEDURE:  The patient was taken to the operating room. SCDs were placed and IV antibiotics were given. The patient's operative site was prepped and draped in a sterile fashion. A time out was performed and all information was confirmed to be correct.  General anesthesia was administered.  The area was irrigated with normal saline and antibiotic solution.  The #10 blade and bovie were used to debride the skin, subcutaneous tissue and muscle of the 7 x 12 cm wound area.  Hemostasis was achieved with electrocautery.  The Acell powder was sprinkled over the entire wound (3 gm).  The sheet was prepared and placed over the wound 7 x 10 cm covered.  The entire sheet was utilized.  The sheet was secured with 5-0 Vicryl. The adaptic was applied and secured  with a staple.  The surgical lube and VAC were placed.  An excellent seal was obtained.  The patient tolerated the procedure well.  There were no complications. The patient was allowed to wake from anesthesia, extubated and taken to the recovery room in satisfactory condition.

## 2014-07-05 NOTE — Interval H&P Note (Signed)
History and Physical Interval Note:  07/05/2014 12:03 PM  Eric Shaw  has presented today for surgery, with the diagnosis of NECROTIZING FASCIITIS  The various methods of treatment have been discussed with the patient and family. After consideration of risks, benefits and other options for treatment, the patient has consented to  Procedure(s): DEBRIDEMENT OF WOUND VAC WITH ACCELL POSTERIOR NECK (N/A) as a surgical intervention .  The patient's history has been reviewed, patient examined, no change in status, stable for surgery.  I have reviewed the patient's chart and labs.  Questions were answered to the patient's satisfaction.     [name]

## 2014-07-05 NOTE — Consult Note (Addendum)
WOC follow-up: Plastics team now following for assessment and plan of care; pt to the OR today for possible wound closure. Please refer to their team for further questions. Please re-consult if further assistance is needed.  Thank-you,  Cammie Mcgeeawn Morelia Cassells MSN, RN, CWOCN, [email or web address], CNS [phone]

## 2014-07-05 NOTE — Progress Notes (Signed)
Dr Michelle Piperossey notifed of pts hr=120 will come by to see pt

## 2014-07-05 NOTE — Progress Notes (Signed)
Report given to meagen rn as caregiver 

## 2014-07-05 NOTE — H&P (View-Only) (Signed)
Patient ID: Eric Shaw, male   DOB: 06/10/1960, 53 y.o.   MRN: [id number]   Wound vac removed. Bed of wound all pink and granulating. Will leave open for Dr. Sanger and wound care nurse to see and redress.  

## 2014-07-05 NOTE — Anesthesia Postprocedure Evaluation (Signed)
Anesthesia Post Note  Patient: Eric CraneJon Rogan  Procedure(s) Performed: Procedure(s) (LRB): APPLICATION OF WOUND VAC (N/A) IRRIGATION AND DEBRIDEMENT WOUND (N/A) APPLICATION OF A-CELL OF HEAD/NECK (N/A)  Anesthesia type: general  Patient location: PACU  Post pain: Pain level controlled  Post assessment: Patient's Cardiovascular Status Stable  Last Vitals:  Filed Vitals:   07/05/14 1515  BP:   Pulse:   Temp:   Resp: 14    Post vital signs: Reviewed and stable  Level of consciousness: sedated  Complications: No apparent anesthesia complications

## 2014-07-05 NOTE — Progress Notes (Addendum)
Day of Surgery  Subjective: To OR today. Patient seen in holding.  Objective: Vital signs in last 24 hours: Temp:  [98 F (36.7 C)-98.7 F (37.1 C)] 98.5 F (36.9 C) (03/02 0551) Pulse Rate:  [92-107] 92 (03/02 0551) Resp:  [18-20] 18 (03/02 0551) BP: (127-133)/(47-99) 133/88 mmHg (03/02 0551) SpO2:  [97 %-100 %] 97 % (03/02 0551) Weight:  [93 kg (205 lb 0.4 oz)] 93 kg (205 lb 0.4 oz) (03/02 0551) Last BM Date: 07/04/14  Intake/Output from previous day: 03/01 0701 - 03/02 0700 In: 610 [P.O.:480; I.V.:130] Out: 2000 [Urine:2000] Intake/Output this shift: Total I/O In: 1000 [I.V.:1000] Out: 0   General appearance: alert, cooperative and VAC in place Incision/Wound:  Lab Results:   Recent Labs  07/05/14 0525  WBC 7.3  HGB 11.7*  HCT 35.6*  PLT 500*   BMET  Recent Labs  07/05/14 0525  NA 135  K 4.0  CL 100  CO2 27  GLUCOSE 163*  BUN 6  CREATININE 0.56  CALCIUM 9.0   PT/INR No results for input(s): LABPROT, INR in the last 72 hours. ABG No results for input(s): PHART, HCO3 in the last 72 hours.  Invalid input(s): PCO2, PO2  Studies/Results: No results found.  Anti-infectives: Anti-infectives    Start     Dose/Rate Route Frequency Ordered Stop   07/05/14 2200  ceFAZolin (ANCEF) IVPB 2 g/50 mL premix     2 g 100 mL/hr over 30 Minutes Intravenous 3 times per day 07/05/14 1253     07/05/14 1303  polymyxin B 500,000 Units, bacitracin 50,000 Units in sodium chloride irrigation 0.9 % 500 mL irrigation       As needed 07/05/14 1304     07/05/14 1030  ceFAZolin (ANCEF) IVPB 2 g/50 mL premix  Status:  Discontinued     2 g 100 mL/hr over 30 Minutes Intravenous Every 8 hours 07/05/14 1019 07/05/14 1253   06/25/14 1400  ceFAZolin (ANCEF) IVPB 2 g/50 mL premix     2 g 100 mL/hr over 30 Minutes Intravenous 3 times per day 06/25/14 1134 07/04/14 2215   06/22/14 1400  ceFAZolin (ANCEF) IVPB 2 g/50 mL premix  Status:  Discontinued     2 g 100 mL/hr over 30  Minutes Intravenous 3 times per day 06/22/14 1011 06/24/14 1454   06/21/14 1800  ceFAZolin (ANCEF) IVPB 2 g/50 mL premix  Status:  Discontinued     2 g 100 mL/hr over 30 Minutes Intravenous 4 times per day 06/21/14 1505 06/22/14 1011   06/20/14 2000  vancomycin (VANCOCIN) IVPB 1000 mg/200 mL premix  Status:  Discontinued     1,000 mg 200 mL/hr over 60 Minutes Intravenous Every 12 hours 06/20/14 1123 06/21/14 1503   06/20/14 0800  vancomycin (VANCOCIN) IVPB 750 mg/150 ml premix  Status:  Discontinued     750 mg 150 mL/hr over 60 Minutes Intravenous Every 12 hours 06/19/14 1636 06/20/14 1123   06/20/14 0800  ciprofloxacin (CIPRO) IVPB 400 mg  Status:  Discontinued     400 mg 200 mL/hr over 60 Minutes Intravenous Every 12 hours 06/20/14 0735 06/21/14 0925   06/20/14 0100  piperacillin-tazobactam (ZOSYN) IVPB 3.375 g  Status:  Discontinued     3.375 g 12.5 mL/hr over 240 Minutes Intravenous Every 8 hours 06/19/14 1820 06/21/14 1503   06/19/14 1830  piperacillin-tazobactam (ZOSYN) IVPB 3.375 g     3.375 g 100 mL/hr over 30 Minutes Intravenous  Once 06/19/14 1812 06/19/14 1922   06/19/14  1645  vancomycin (VANCOCIN) 2,000 mg in sodium chloride 0.9 % 500 mL IVPB     2,000 mg 250 mL/hr over 120 Minutes Intravenous  Once 06/19/14 1634 06/19/14 1913      Assessment/Plan: s/p Procedure(s): APPLICATION OF WOUND VAC (N/A) IRRIGATION AND DEBRIDEMENT WOUND (N/A) APPLICATION OF A-CELL OF HEAD/NECK (N/A) Discharge per primary team when home health and home VAC arranged.  Will need once a week VAC check but do not change unless needed or leak.  VAC at 100 mmHg continuous pressure (can increase to 125 mmHg if needed).  LOS: 16 days    [facility] Hospital[facility]ER,CLAIRE 07/05/2014

## 2014-07-05 NOTE — Anesthesia Preprocedure Evaluation (Addendum)
Anesthesia Evaluation  Patient identified by MRN, date of birth, ID band Patient awake    Reviewed: Allergy & Precautions, NPO status , Patient's Chart, lab work & pertinent test results  History of Anesthesia Complications (+) DIFFICULT AIRWAY  Airway Mallampati: I  TM Distance: >3 FB Neck ROM: Limited    Dental  (+) Teeth Intact, Dental Advisory Given   Pulmonary sleep apnea , Current Smoker,  breath sounds clear to auscultation  + decreased breath sounds      Cardiovascular hypertension, Pt. on medications Rhythm:Regular Rate:Normal     Neuro/Psych Depression    GI/Hepatic negative GI ROS, Neg liver ROS,   Endo/Other  diabetes, Poorly Controlled, Insulin Dependent, Oral Hypoglycemic Agents  Renal/GU negative Renal ROS     Musculoskeletal   Abdominal Normal abdominal exam  (+)   Peds  Hematology   Anesthesia Other Findings   Reproductive/Obstetrics                            Anesthesia Physical Anesthesia Plan  ASA: II  Anesthesia Plan: General   Post-op Pain Management:    Induction: Intravenous  Airway Management Planned: Awake Intubation Planned, Oral ETT and Video Laryngoscope Planned  Additional Equipment:   Intra-op Plan:   Post-operative Plan: Extubation in OR  Informed Consent: I have reviewed the patients History and Physical, chart, labs and discussed the procedure including the risks, benefits and alternatives for the proposed anesthesia with the patient or authorized representative who has indicated his/her understanding and acceptance.   Dental advisory given  Plan Discussed with: Anesthesiologist and Surgeon  Anesthesia Plan Comments: (Was a difficult intubation when presented with neck abscess. Now far less swelling. Neck motion good. Will induce GA and use short acting relaxants to intubated. +/- Glidescope.)       Anesthesia Quick Evaluation

## 2014-07-05 NOTE — Brief Op Note (Signed)
06/19/2014 - 07/05/2014  1:35 PM  PATIENT:  Pierce CraneJon Shannahan  54 y.o. male  PRE-OPERATIVE DIAGNOSIS:  NECROTIZING FASCIITIS  POST-OPERATIVE DIAGNOSIS:  necrotizing fascitis  PROCEDURE:  Procedure(s): DEBRIDEMENT OF WOUND VAC WITH ACCELL POSTERIOR NECK (N/A) APPLICATION OF WOUND VAC (N/A) IRRIGATION AND DEBRIDEMENT WOUND (N/A) APPLICATION OF A-CELL OF HEAD/NECK (N/A)  SURGEON:  Surgeon(s) and Role:    * Serena ColonelJefry Rosen, MD - Primary    * Meryle Pugmire Sanger, DO - Assisting  PHYSICIAN ASSISTANT: Shawn Rayburn, PA  ASSISTANTS: none   ANESTHESIA:   general  EBL:  Total I/O In: 1000 [I.V.:1000] Out: 0   BLOOD ADMINISTERED:none  DRAINS: none   LOCAL MEDICATIONS USED:  NONE  SPECIMEN:  No Specimen  DISPOSITION OF SPECIMEN:  N/A  COUNTS:  YES  TOURNIQUET:  * No tourniquets in log *  DICTATION: .Dragon Dictation  PLAN OF CARE: Admit to inpatient   PATIENT DISPOSITION:  PACU - hemodynamically stable.   Delay start of Pharmacological VTE agent (>24hrs) due to surgical blood loss or risk of bleeding: no

## 2014-07-05 NOTE — Transfer of Care (Signed)
Immediate Anesthesia Transfer of Care Note  Patient: Eric Shaw  Procedure(s) Performed: Procedure(s): APPLICATION OF WOUND VAC (N/A) IRRIGATION AND DEBRIDEMENT WOUND (N/A) APPLICATION OF A-CELL OF HEAD/NECK (N/A)  Patient Location: PACU  Anesthesia Type:General  Level of Consciousness: patient cooperative and responds to stimulation  Airway & Oxygen Therapy: Patient Spontanous Breathing and Patient connected to nasal cannula oxygen  Post-op Assessment: Report given to RN and Post -op Vital signs reviewed and stable  Post vital signs: Reviewed and stable  Last Vitals:  Filed Vitals:   07/05/14 0551  BP: 133/88  Pulse: 92  Temp: 36.9 C  Resp: 18    Complications: No apparent anesthesia complications

## 2014-07-06 ENCOUNTER — Encounter (HOSPITAL_COMMUNITY): Payer: Self-pay | Admitting: Otolaryngology

## 2014-07-06 DIAGNOSIS — M726 Necrotizing fasciitis: Secondary | ICD-10-CM | POA: Diagnosis not present

## 2014-07-06 DIAGNOSIS — L03221 Cellulitis of neck: Secondary | ICD-10-CM | POA: Diagnosis not present

## 2014-07-06 DIAGNOSIS — L0211 Cutaneous abscess of neck: Secondary | ICD-10-CM | POA: Diagnosis not present

## 2014-07-06 LAB — GLUCOSE, CAPILLARY
GLUCOSE-CAPILLARY: 179 mg/dL — AB (ref 70–99)
Glucose-Capillary: 186 mg/dL — ABNORMAL HIGH (ref 70–99)

## 2014-07-06 MED ORDER — OXYCODONE-ACETAMINOPHEN 5-325 MG PO TABS: 1.0000 | ORAL_TABLET | ORAL | Status: DC | PRN

## 2014-07-06 MED ORDER — CLINDAMYCIN HCL 300 MG PO CAPS: 300.0000 mg | ORAL_CAPSULE | Freq: Three times a day (TID) | ORAL | Status: DC

## 2014-07-06 MED ORDER — GLUCERNA SHAKE PO LIQD: 237.0000 mL | Freq: Two times a day (BID) | ORAL | Status: DC

## 2014-07-06 MED ORDER — TAMSULOSIN HCL 0.4 MG PO CAPS: 0.4000 mg | ORAL_CAPSULE | Freq: Every day | ORAL | Status: DC

## 2014-07-06 MED ORDER — SACCHAROMYCES BOULARDII 250 MG PO CAPS: 250.0000 mg | ORAL_CAPSULE | Freq: Two times a day (BID) | ORAL | Status: DC

## 2014-07-06 NOTE — Discharge Summary (Signed)
Physician Discharge Summary  Eric Shaw ZOX:[id number] DOB: 03-May-1961 DOA: 06/19/2014  PCP: No PCP Per Patient  Admit date: 06/19/2014 Discharge date: 07/06/2014  Time spent: 30 minutes  Recommendations for Outpatient Follow-up:  1. Follow up with ENT and surgery as recommended 2. Please follow up with home health RN for wound vac management.  3. Follow up with PCP in one week.   Discharge Diagnoses:  Principal Problem:   Necrotizing fasciitis Active Problems:   Type 2 diabetes mellitus with peripheral neuropathy   Diabetic neuropathy   MDD (major depressive disorder)   Cellulitis   Staphylococcal sepsis   Hyponatremia   Acute respiratory acidosis   Sepsis   Abscess or cellulitis, neck   Cellulitis, neck   Acute respiratory failure with hypoxia   Acute urinary retention   Discharge Condition: improved  Diet recommendation: carb modified diet   Filed Weights   07/04/14 0501 07/05/14 0551 07/06/14 0504  Weight: 92.3 kg (203 lb 7.8 oz) 93 kg (205 lb 0.4 oz) 91.445 kg (201 lb 9.6 oz)    History of present illness:  The patient is a 54 year old male with history of type 2 diabetes mellitus with hemoglobin A1c of 9.8, depression with suicidal ideations previously requiring admission to behavioral health, morbid obesity, hypertension, who presented to the emergency department with progressively worsening neck pain, swelling, redness, tenderness. He had noticed some swelling about 1 week prior to admission and got progressively worse. He denied fever, chills, but did complain that he felt generally weak and had not been able to eat or take his medications. He was found to have necrotizing fasciitis of the left posterior neck. He was started on broad-spectrum antibiotics and seen by ENT. On 2/20 he was taken to the operating room for debridement of abscess and necrotizing fasciitis. He was intubated for the procedure and he remained intubated for another few days secondary to airway  edema. He was extubated on 2/24 and passed a swallowing evaluation on 2/25. He has a wound VAC in place and is being monitored daily by ENT. Cultures have grown MSSA. He underwent another irrigation 3/3 and wound vac placed. He was discharged home with home health services.   Hospital Course:  MSSA necrotizing fasciitis of the left neck status post debridement on 2/20  - Wound vac last exchanged on 2/29 - BCx2 2/15: No growth final - Neck abscess 2/20: MSSA - Ancef started 2/17 and discontinued on 3/3 and discharged on clindamycin to complete the course.  - Started florastor  - Appreciate ENT/plastic management of wound - s/p application of wound vac and irrigation and debridement of the wound on 3/3  Acute hypoxic respiratory failure secondary to interstitial and airway edema post intubation resolved with steroids to assist with extubation.  Diabetes mellitus type 2, uncontrolled, hemoglobin A1c 9.8 CBG (last 3)   Recent Labs (last 2 labs)      Recent Labs  07/05/14 1153 07/05/14 1407 07/05/14 1708  GLUCAP 173* 184* 166*      Continue with SSI. He is discharged on his home dose insulin.   Major Depression, stable - Continue Abilify, Cymbalta, gabapentin  Urinary retention, still retaining urine but able to void.  - Continue flomax       Procedures: STUDIES:  2/19 CT neck - severe cellulitis involving the posterior soft tissues of the occiput and upper cervical spine. Edema extends into the trapezius and semispinalis capitis musculature. 2/19 CT neck - Progressive posterior cervical/occipital cellulitis and myositis There is a  mix of phlegmon and abscess in the bilateral posterior cervical space and left paraspinal compartment, as above. Stable mild retropharyngeal edema.  SIGNIFICANT EVENTS: 2/20 OR, abscess drainage, findings of nec fasc, left on vent, airway difficult 2/22 pos balance 2/23- int prom on pcxr  Consultations:  ENT  Plastic  surgery.   Discharge Exam: Filed Vitals:   07/06/14 0504  BP: 116/61  Pulse: 98  Temp: 98 F (36.7 C)  Resp: 18    General: alert afebrile comfortable Cardiovascular: s1s2 Respiratory: ctab  Discharge Instructions   Discharge Instructions    Diet - low sodium heart healthy    Complete by:  As directed      Discharge instructions    Complete by:  As directed   Follow up with Dr Pollyann Kennedy as recommended.  Please follow up with home health RN as recommended.          Current Discharge Medication List    START taking these medications   Details  clindamycin (CLEOCIN) 300 MG capsule Take 1 capsule (300 mg total) by mouth 3 (three) times daily. Qty: 15 capsule, Refills: 0    feeding supplement, GLUCERNA SHAKE, (GLUCERNA SHAKE) LIQD Take 237 mLs by mouth 2 (two) times daily between meals. Refills: 0    oxyCODONE-acetaminophen (PERCOCET/ROXICET) 5-325 MG per tablet Take 1-2 tablets by mouth every 4 (four) hours as needed for moderate pain or severe pain. Qty: 10 tablet, Refills: 0    saccharomyces boulardii (FLORASTOR) 250 MG capsule Take 1 capsule (250 mg total) by mouth 2 (two) times daily. Qty: 30 capsule, Refills: 0    tamsulosin (FLOMAX) 0.4 MG CAPS capsule Take 1 capsule (0.4 mg total) by mouth daily after supper. Qty: 30 capsule, Refills: 1      CONTINUE these medications which have NOT CHANGED   Details  ARIPiprazole (ABILIFY) 5 MG tablet Take 5 mg by mouth every morning.    atenolol (TENORMIN) 50 MG tablet Take 50 mg by mouth daily.    DULoxetine (CYMBALTA) 30 MG capsule Take 90 mg by mouth every evening.    gabapentin (NEURONTIN) 300 MG capsule Take 600 mg by mouth 3 (three) times daily.    insulin detemir (LEVEMIR) 100 UNIT/ML injection Inject 55 Units into the skin 2 (two) times daily.    insulin lispro (HUMALOG) 100 UNIT/ML injection Inject 0-18 Units into the skin 3 (three) times daily after meals. 15 minutes after meals    lisinopril  (PRINIVIL,ZESTRIL) 10 MG tablet Take 1 tablet (10 mg total) by mouth daily. For hypertension and renal protection. Qty: 30 tablet, Refills: 0    naproxen (NAPROSYN) 500 MG tablet Take 500 mg by mouth 2 (two) times daily as needed for moderate pain.       No Known Allergies Follow-up Information    Follow up with [facility]     On 07/06/2014.   Why:  11 am for hospital follow up, bring $20 co pay and photo id   Contact information:   [address] [facility] [address] [phone]      Follow up with [facility], DO On 07/11/2014.   Specialty:  Plastic Surgery   Contact information:   [address] [place] [address] [phone]       Follow up with Serena Colonel, MD On 07/13/2014.   Specialty:  Otolaryngology   Why:  Appt scheduled for Thursday July 13, 2014 @ 01:45pm with Dr. Erin Fulling information:  [address] [place] [address] [phone]        The results of significant diagnostics from this hospitalization (including imaging, microbiology, ancillary and laboratory) are listed below for reference.    Significant Diagnostic Studies: Ct Soft Tissue Neck W Contrast  06/23/2014   CLINICAL DATA:  Ingrown hair on back of neck which became infected. Yellowish drainage from the wound.  EXAM: CT NECK WITH CONTRAST  TECHNIQUE: Multidetector CT imaging of the neck was performed using the standard protocol following the bolus administration of intravenous contrast.  CONTRAST:  75mL OMNIPAQUE IOHEXOL 300 MG/ML  SOLN  COMPARISON:  06/19/2014  FINDINGS: Progressive marked skin thickening and subcutaneous reticulation/expansion throughout the posterior neck and occipital scalp, consistent with severe cellulitis. As before, there is expansion and edema in the bilateral upper trapezius consistent with myositis. There is also extensive inflammation deep to the trapezius, with thin fluid collections in  the left more than right posterior cervical space fat. Below the deep fascia, in the paraspinal compartment, is increasingly discrete fluid between the left semispinalis capitis and splenius capitis, measuring 12 x 35 x 60 mm. This fluid has rim enhancement and mass effect and is most consistent with combination of abscess and phlegmon. There is mild retropharyngeal edema which is not progressed. No retropharyngeal abscess or phlegmon.  No evidence of intrathoracic or spinal infection.  Pharynx and larynx: Normal  Salivary glands: Normal  Thyroid: Normal  Lymph nodes: No separate adenopathy.  Vascular: Cervical carotid atherosclerosis. No major vessel occlusion.  Limited intracranial: Negative  Visualized orbits: Negative  Mastoids and visualized paranasal sinuses: Clear  Skeleton: Congenitally narrow spinal canal with superimposed lower cervical degenerative disc disease. No evidence of osseous infection.  Upper chest: No evidence of pneumonia.  IMPRESSION: 1. Progressive posterior cervical/occipital cellulitis and myositis. There is a mix of phlegmon and abscess in the bilateral posterior cervical space and left paraspinal compartment, as above. 2. Stable mild retropharyngeal edema.   Electronically Signed   By: Marnee SpringJonathon  Watts M.D.   On: 06/23/2014 12:36   Ct Soft Tissue Neck W Contrast  06/19/2014   CLINICAL DATA:  54 year old male with draining soft tissue infection which reportedly began as an ingrown hair.  EXAM: CT NECK WITH CONTRAST  TECHNIQUE: Multidetector CT imaging of the neck was performed using the standard protocol following the bolus administration of intravenous contrast.  CONTRAST:  100mL OMNIPAQUE IOHEXOL 300 MG/ML  SOLN  COMPARISON:  Prior CT scan of the face and orbits 03/09/2013  FINDINGS: Pharynx and larynx: Within normal limits. No asymmetric soft tissue fullness or enhancement.  Salivary glands: Within normal limits.  Thyroid: Normal.  Lymph nodes: No pathologic adenopathy identified.   Vascular: Mild bilateral atherosclerotic calcifications in the proximal internal carotid arteries.  Limited intracranial: Within normal limits  Visualized orbits: Intact and symmetric bilaterally.  Mastoids and visualized paranasal sinuses: Normal aeration.  Skeleton: No acute fracture or aggressive appearing lytic or blastic osseous lesion.  Upper chest: Unremarkable.  Other: Extensive skin thickening and reticulation of the subcutaneous fat beginning in the occipital skull and extending inferiorly to the upper back. Edema tracks into the trapezius and semi spinalis capitis musculature. No focal fluid collection to suggest abscess. Degenerative disc disease with posterior disc osteophyte complex at C7-T1. Mild central stenosis.  IMPRESSION: 1. CT findings are most consistent with severe cellulitis involving the posterior soft tissues of the occiput and upper cervical spine. Edema extends into the trapezius and semispinalis capitis musculature. However, there  is no focal fluid collection to suggest abscess. 2. Mild atherosclerotic calcifications in the bilateral proximal internal carotid arteries. 3. Focal degenerative disc disease with mild central stenosis at C7-T1.   Electronically Signed   By: Malachy Moan M.D.   On: 06/19/2014 16:29   Dg Chest Port 1 View  06/28/2014   CLINICAL DATA:  Ventilator dependent respiratory failure  EXAM: PORTABLE CHEST - 1 VIEW  COMPARISON:  Portable chest x-ray of 06/27/2014  FINDINGS: The tip of the endotracheal tube is approximately 3.7 cm above the carina. Haziness remains particularly at the right lung base consistent with atelectasis and possible small effusion. Mild left basilar atelectasis remains. The left central venous line tip overlies the mid SVC near the junction with the left innominate vein and heart size is stable.  IMPRESSION: 1. Endotracheal tube tip approximately 3.7 cm above the carina. 2. Probable basilar atelectasis and small effusions, especially on  the right.   Electronically Signed   By: Dwyane Dee M.D.   On: 06/28/2014 08:11   Dg Chest Port 1 View  06/27/2014   CLINICAL DATA:  Hypoxia  EXAM: PORTABLE CHEST - 1 VIEW  COMPARISON:  June 25, 2014  FINDINGS: Endotracheal tube tip is 3.8 cm above the carina. Central catheter tip is at the junction of the left innominate vein and superior vena cava. Nasogastric tube tip and side port are below the diaphragm. No pneumothorax. There is atelectatic change in the left base. Lungs elsewhere clear. Heart is upper normal in size with pulmonary vascularity within normal limits. No adenopathy.  IMPRESSION: Tube and catheter positions as described without pneumothorax. Left lower lobe atelectasis. Elsewhere lungs clear.   Electronically Signed   By: Bretta Bang III M.D.   On: 06/27/2014 08:06   Dg Chest Port 1 View  06/25/2014   CLINICAL DATA:  Acute respiratory acidosis, history type 2 diabetes, hypertension  EXAM: PORTABLE CHEST - 1 VIEW  COMPARISON:  Portable exam 0657 hr compared to 06/24/2014  FINDINGS: Tip of endotracheal tube projects approximately 4.5 cm above carina.  Nasogastric tube extends into stomach.  LEFT subclavian central venous catheter tip projecting over SVC.  Upper normal heart size.  No mediastinal contours and pulmonary vascularity.  Minimal atelectasis at RIGHT base.  Lungs otherwise clear.  No pleural effusion or pneumothorax.  IMPRESSION: Minimal chronic persistent RIGHT basilar atelectasis.   Electronically Signed   By: Ulyses Southward M.D.   On: 06/25/2014 07:26   Dg Chest Port 1 View  06/24/2014   CLINICAL DATA:  Initial evaluation for acute respiratory acidosis  EXAM: PORTABLE CHEST - 1 VIEW  COMPARISON:  06/20/2014  FINDINGS: Endotracheal tube identified with tip 2.5 cm above the carina. Left subclavian central line identified with tip over the anticipated position of the junction of the innominate vein and superior vena cava. No pneumothorax.  Heart size normal for technique.  Vascular pattern normal. Lungs clear.  IMPRESSION: No acute cardiopulmonary abnormality.  Lines and tubes as described.   Electronically Signed   By: Esperanza Heir M.D.   On: 06/24/2014 16:23   Dg Chest Port 1 View  06/20/2014   CLINICAL DATA:  Shortness of breath and weakness  EXAM: PORTABLE CHEST - 1 VIEW  COMPARISON:  01/19/2013  FINDINGS: The heart size and mediastinal contours are at upper limits of normal, unchanged. Both lungs are clear allowing for hypoaeration and crowding of the bronchovascular markings. The visualized skeletal structures are unremarkable.  IMPRESSION: Low lung volumes with crowding  of the bronchovascular markings but no focal acute finding.   Electronically Signed   By: Christiana Pellant M.D.   On: 06/20/2014 16:37   Dg Abd Portable 1v  06/25/2014   CLINICAL DATA:  Gastric tube  EXAM: PORTABLE ABDOMEN - 1 VIEW  FINDINGS: The nasogastric tube extends into the stomach with tip in the fundus.  IMPRESSION: Nasogastric tube reaches the stomach.   Electronically Signed   By: Ellery Plunk M.D.   On: 06/25/2014 00:31    Microbiology: Recent Results (from the past 240 hour(s))  Surgical pcr screen     Status: None   Collection Time: 07/05/14 12:32 AM  Result Value Ref Range Status   MRSA, PCR NEGATIVE NEGATIVE Final   Staphylococcus aureus NEGATIVE NEGATIVE Final    Comment:        The Xpert SA Assay (FDA approved for NASAL specimens in patients over 41 years of age), is one component of a comprehensive surveillance program.  Test performance has been validated by [facility] for patients greater than or equal to 48 year old. It is not intended to diagnose infection nor to guide or monitor treatment.      Labs: Basic Metabolic Panel:  Recent Labs Lab 07/01/14 0550 07/05/14 0525  NA 139 135  K 3.4* 4.0  CL 98 100  CO2 34* 27  GLUCOSE 94 163*  BUN 8 6  CREATININE 0.60 0.56  CALCIUM 8.3* 9.0   Liver Function Tests: No results for input(s): AST,  ALT, ALKPHOS, BILITOT, PROT, ALBUMIN in the last 168 hours. No results for input(s): LIPASE, AMYLASE in the last 168 hours. No results for input(s): AMMONIA in the last 168 hours. CBC:  Recent Labs Lab 07/01/14 0550 07/05/14 0525  WBC 9.0 7.3  HGB 10.4* 11.7*  HCT 32.5* 35.6*  MCV 93.1 92.2  PLT 448* 500*   Cardiac Enzymes: No results for input(s): CKTOTAL, CKMB, CKMBINDEX, TROPONINI in the last 168 hours. BNP: BNP (last 3 results) No results for input(s): BNP in the last 8760 hours.  ProBNP (last 3 results) No results for input(s): PROBNP in the last 8760 hours.  CBG:  Recent Labs Lab 07/05/14 1153 07/05/14 1407 07/05/14 1708 07/05/14 2135 07/06/14 0757  GLUCAP 173* 184* 166* 226* 179*       Signed:  Felecia Stanfill  [facility] 07/06/2014, 12:01 PM

## 2014-07-06 NOTE — Progress Notes (Signed)
Patient discharge teaching given, including activity, diet, follow-up appoints, and medications. Patient verbalized understanding of all discharge instructions. IV access was d/c'd. Vitals are stable. Skin is intact except as charted in most recent assessments. Pt to be escorted out by NT, to be driven home by family. 

## 2014-07-14 ENCOUNTER — Ambulatory Visit: Payer: Self-pay | Attending: Internal Medicine | Admitting: Family Medicine

## 2014-07-14 VITALS — BP 145/93 | HR 108 | Temp 98.6°F | Resp 16 | Ht 70.0 in | Wt 209.0 lb

## 2014-07-14 DIAGNOSIS — E118 Type 2 diabetes mellitus with unspecified complications: Secondary | ICD-10-CM

## 2014-07-14 DIAGNOSIS — M726 Necrotizing fasciitis: Secondary | ICD-10-CM | POA: Insufficient documentation

## 2014-07-14 DIAGNOSIS — E1142 Type 2 diabetes mellitus with diabetic polyneuropathy: Secondary | ICD-10-CM | POA: Insufficient documentation

## 2014-07-14 MED ORDER — GLUCOSE BLOOD VI STRP: ORAL_STRIP | Status: DC

## 2014-07-14 MED ORDER — INSULIN DETEMIR 100 UNIT/ML ~~LOC~~ SOLN: 50.0000 [IU] | Freq: Two times a day (BID) | SUBCUTANEOUS | Status: DC

## 2014-07-14 MED ORDER — INSULIN ASPART 100 UNIT/ML ~~LOC~~ SOLN: 3.0000 [IU] | Freq: Three times a day (TID) | SUBCUTANEOUS | Status: DC

## 2014-07-14 MED ORDER — MEIJER TRUERESULT GLUCOSE SYS W/DEVICE KIT: PACK | Status: DC

## 2014-07-14 MED ORDER — ACCU-CHEK MULTICLIX LANCETS MISC: Status: AC

## 2014-07-14 NOTE — Progress Notes (Signed)
Patient ID: Eric Shaw, male   DOB: 03-12-1961, 54 y.o.   MRN: [id number]   Above patient presents for follow-up hospital stay for necrotizing facitis on his posterior nexx.Surgery was performed on 2/22 and he has been back on several occassions for continued debridement. He has a wound vac in place. He has an appointment with surgeon this afternoon. He has a history of Type 2 diabetes with peripheral neuropathy, previous abscess and cellulitis, acute urinary retention,MDD with suicidal ideations in the past, hyponatremia.  ROS:  General:  Denies fever, chills, loss of appetite  Respiratory: No cough or dyspnea  Cardiac: No CP with exertion or rest.  GI: No abdominal pain, nausea or vomitin  GU: No recent urinary retention.  EXAM:  General:  Alert, oriented, appropriate, in no distress, skin is warm and dry. HEENT:  There is a covered cavity wound on the posterior neck, otherwise unremarkable Neck:      No adenopathy or tenderness. Lungs:    Clear to auscultation HS:    Regular w/o m,g,r.   Assessment:  1. Necrotizing Fascitis 2. Type II diabetes with peripheral neuropathy.  Plan:  1. Necrotozing Fascitis-  Follow-up with surgeon as planned,  2. Type II diabetes - New glucometer and supplies, refill of medications for diabetes, follow-up with the      Next month for visit with PCP assigned here, prescription for Glucerna per Home health request.

## 2014-07-14 NOTE — Progress Notes (Signed)
Patient here to establish care after recent hospital visit for necrotizing fascitis to his occipital region.  Wound vc in place and wound RN comes to his house on M,W,F to change dressings. Patient needs prescription for glucometer test strips as well as Glucerna nutritional drink.      History of present illness:  The patient is a 54 year old male with history of type 2 diabetes mellitus with hemoglobin A1c of 9.8, depression with suicidal ideations previously requiring admission to behavioral health, morbid obesity, hypertension, who presented to the emergency department with progressively worsening neck pain, swelling, redness, tenderness. He had noticed some swelling about 1 week prior to admission and got progressively worse. He denied fever, chills, but did complain that he felt generally weak and had not been able to eat or take his medications. He was found to have necrotizing fasciitis of the left posterior neck. He was started on broad-spectrum antibiotics and seen by ENT. On 2/20 he was taken to the operating room for debridement of abscess and necrotizing fasciitis. He was intubated for the procedure and he remained intubated for another few days secondary to airway edema. He was extubated on 2/24 and passed a swallowing evaluation on 2/25. He has a wound VAC in place and is being monitored daily by ENT. Cultures have grown MSSA. He underwent another irrigation 3/3 and wound vac placed. He was discharged home with home health services.   Hospital Course:     MSSA necrotizing fasciitis of the left neck status post debridement on 2/20  - Wound vac last exchanged on 2/29 - BCx2 2/15: No growth final - Neck abscess 2/20: MSSA - Ancef started 2/17 and discontinued on 3/3 and discharged on clindamycin to complete the course.  - Started florastor  - Appreciate ENT/plastic management of wound - s/p application of wound vac and irrigation and debridement of the wound on  3/3  Acute hypoxic respiratory failure secondary to interstitial and airway edema post intubation resolved with steroids to assist with extubation.

## 2014-07-14 NOTE — Patient Instructions (Signed)
Restart Levemir and 50 units BID Novolog per sliding scale Other meds as per hospital Follow-up here with primary care provider here in one month.

## 2014-07-17 ENCOUNTER — Other Ambulatory Visit: Payer: Self-pay | Admitting: Plastic Surgery

## 2014-07-17 DIAGNOSIS — S1190XA Unspecified open wound of unspecified part of neck, initial encounter: Secondary | ICD-10-CM

## 2014-07-18 ENCOUNTER — Other Ambulatory Visit: Payer: Self-pay

## 2014-07-18 ENCOUNTER — Encounter (HOSPITAL_BASED_OUTPATIENT_CLINIC_OR_DEPARTMENT_OTHER)
Admission: RE | Admit: 2014-07-18 | Discharge: 2014-07-18 | Disposition: A | Discharge status: Home / Self Care | Payer: Self-pay | Source: Ambulatory Visit | Attending: Plastic Surgery | Admitting: Plastic Surgery

## 2014-07-18 ENCOUNTER — Encounter (HOSPITAL_BASED_OUTPATIENT_CLINIC_OR_DEPARTMENT_OTHER): Payer: Self-pay | Admitting: *Deleted

## 2014-07-18 DIAGNOSIS — Z01810 Encounter for preprocedural cardiovascular examination: Secondary | ICD-10-CM | POA: Insufficient documentation

## 2014-07-18 LAB — BASIC METABOLIC PANEL
Anion gap: 10 (ref 5–15)
BUN: 8 mg/dL (ref 6–23)
CO2: 25 mmol/L (ref 19–32)
Calcium: 9.4 mg/dL (ref 8.4–10.5)
Chloride: 105 mmol/L (ref 96–112)
Creatinine, Ser: 0.61 mg/dL (ref 0.50–1.35)
GFR calc Af Amer: >90 mL/min (ref >90)
GFR calc non Af Amer: >90 mL/min (ref >90)
GLUCOSE: 190 mg/dL — AB (ref 70–99)
POTASSIUM: 3.3 mmol/L — AB (ref 3.5–5.1)
Sodium: 140 mmol/L (ref 135–145)

## 2014-07-18 NOTE — Progress Notes (Signed)
Pt was critically ill 3/16-septic in hospital-home now-so much better-to come in for new bmet since they were abn last

## 2014-07-18 NOTE — Progress Notes (Signed)
Left message on nurse line at Dr Leonie GreenSanger's office with low k of 3.3.  Requested call back.

## 2014-07-19 ENCOUNTER — Other Ambulatory Visit: Payer: Self-pay | Admitting: Plastic Surgery

## 2014-07-20 ENCOUNTER — Ambulatory Visit (HOSPITAL_BASED_OUTPATIENT_CLINIC_OR_DEPARTMENT_OTHER): Payer: Self-pay | Admitting: Certified Registered"

## 2014-07-20 ENCOUNTER — Encounter (HOSPITAL_BASED_OUTPATIENT_CLINIC_OR_DEPARTMENT_OTHER)
Admission: RE | Disposition: A | Discharge status: Home / Self Care | Payer: Self-pay | Source: Ambulatory Visit | Attending: Plastic Surgery

## 2014-07-20 ENCOUNTER — Encounter (HOSPITAL_BASED_OUTPATIENT_CLINIC_OR_DEPARTMENT_OTHER): Payer: Self-pay | Admitting: Certified Registered"

## 2014-07-20 ENCOUNTER — Ambulatory Visit (HOSPITAL_BASED_OUTPATIENT_CLINIC_OR_DEPARTMENT_OTHER)
Admission: RE | Admit: 2014-07-20 | Discharge: 2014-07-20 | Disposition: A | Discharge status: Home / Self Care | Payer: Self-pay | Source: Ambulatory Visit | Attending: Plastic Surgery | Admitting: Plastic Surgery

## 2014-07-20 ENCOUNTER — Ambulatory Visit (HOSPITAL_BASED_OUTPATIENT_CLINIC_OR_DEPARTMENT_OTHER): Payer: MEDICAID | Admitting: Certified Registered"

## 2014-07-20 DIAGNOSIS — E1142 Type 2 diabetes mellitus with diabetic polyneuropathy: Secondary | ICD-10-CM | POA: Insufficient documentation

## 2014-07-20 DIAGNOSIS — E6609 Other obesity due to excess calories: Secondary | ICD-10-CM | POA: Insufficient documentation

## 2014-07-20 DIAGNOSIS — Z6831 Body mass index (BMI) 31.0-31.9, adult: Secondary | ICD-10-CM | POA: Insufficient documentation

## 2014-07-20 DIAGNOSIS — G4730 Sleep apnea, unspecified: Secondary | ICD-10-CM | POA: Insufficient documentation

## 2014-07-20 DIAGNOSIS — M726 Necrotizing fasciitis: Secondary | ICD-10-CM | POA: Insufficient documentation

## 2014-07-20 DIAGNOSIS — Z794 Long term (current) use of insulin: Secondary | ICD-10-CM | POA: Insufficient documentation

## 2014-07-20 DIAGNOSIS — I10 Essential (primary) hypertension: Secondary | ICD-10-CM | POA: Insufficient documentation

## 2014-07-20 DIAGNOSIS — S1190XA Unspecified open wound of unspecified part of neck, initial encounter: Secondary | ICD-10-CM

## 2014-07-20 DIAGNOSIS — M1990 Unspecified osteoarthritis, unspecified site: Secondary | ICD-10-CM | POA: Insufficient documentation

## 2014-07-20 DIAGNOSIS — F329 Major depressive disorder, single episode, unspecified: Secondary | ICD-10-CM | POA: Insufficient documentation

## 2014-07-20 DIAGNOSIS — R45851 Suicidal ideations: Secondary | ICD-10-CM | POA: Insufficient documentation

## 2014-07-20 DIAGNOSIS — F17210 Nicotine dependence, cigarettes, uncomplicated: Secondary | ICD-10-CM | POA: Insufficient documentation

## 2014-07-20 HISTORY — PX: INCISION AND DRAINAGE OF WOUND: SHX1803

## 2014-07-20 HISTORY — PX: APPLICATION OF A-CELL OF HEAD/NECK: SHX6304

## 2014-07-20 LAB — GLUCOSE, CAPILLARY
GLUCOSE-CAPILLARY: 231 mg/dL — AB (ref 70–99)
GLUCOSE-CAPILLARY: 269 mg/dL — AB (ref 70–99)

## 2014-07-20 SURGERY — IRRIGATION AND DEBRIDEMENT WOUND
Anesthesia: General | Site: Neck

## 2014-07-20 MED ORDER — FENTANYL CITRATE 0.05 MG/ML IJ SOLN
INTRAMUSCULAR | Status: AC
  Filled 2014-07-20: qty 4

## 2014-07-20 MED ORDER — CEFAZOLIN SODIUM-DEXTROSE 2-3 GM-% IV SOLR
2.0000 g | INTRAVENOUS | Status: AC
  Administered 2014-07-20: 2 g via INTRAVENOUS

## 2014-07-20 MED ORDER — FENTANYL CITRATE 0.05 MG/ML IJ SOLN
INTRAMUSCULAR | Status: DC | PRN
  Administered 2014-07-20: 100 ug via INTRAVENOUS

## 2014-07-20 MED ORDER — SODIUM CHLORIDE 0.9 % IR SOLN
Status: DC | PRN
  Administered 2014-07-20: 500 mL

## 2014-07-20 MED ORDER — LACTATED RINGERS IV SOLN
INTRAVENOUS | Status: DC
  Administered 2014-07-20 (×2): via INTRAVENOUS

## 2014-07-20 MED ORDER — LIDOCAINE HCL (CARDIAC) 20 MG/ML IV SOLN
INTRAVENOUS | Status: DC | PRN
  Administered 2014-07-20: 100 mg via INTRAVENOUS

## 2014-07-20 MED ORDER — ONDANSETRON HCL 4 MG/2ML IJ SOLN
INTRAMUSCULAR | Status: DC | PRN
Start: 2014-07-20 — End: 2014-07-20
  Administered 2014-07-20: 4 mg via INTRAVENOUS

## 2014-07-20 MED ORDER — PROPOFOL 10 MG/ML IV BOLUS
INTRAVENOUS | Status: DC | PRN
  Administered 2014-07-20: 200 mg via INTRAVENOUS

## 2014-07-20 MED ORDER — MIDAZOLAM HCL 2 MG/2ML IJ SOLN
INTRAMUSCULAR | Status: AC
  Filled 2014-07-20: qty 2

## 2014-07-20 MED ORDER — MIDAZOLAM HCL 5 MG/5ML IJ SOLN
INTRAMUSCULAR | Status: DC | PRN
Start: 2014-07-20 — End: 2014-07-20
  Administered 2014-07-20: 1 mg via INTRAVENOUS

## 2014-07-20 MED ORDER — FENTANYL CITRATE 0.05 MG/ML IJ SOLN: 50.0000 ug | INTRAMUSCULAR | Status: DC | PRN

## 2014-07-20 MED ORDER — MIDAZOLAM HCL 2 MG/2ML IJ SOLN: 1.0000 mg | INTRAMUSCULAR | Status: DC | PRN

## 2014-07-20 MED ORDER — CEFAZOLIN SODIUM-DEXTROSE 2-3 GM-% IV SOLR
INTRAVENOUS | Status: AC
  Filled 2014-07-20: qty 50

## 2014-07-20 MED ORDER — SUCCINYLCHOLINE CHLORIDE 20 MG/ML IJ SOLN
INTRAMUSCULAR | Status: DC | PRN
  Administered 2014-07-20: 140 mg via INTRAVENOUS

## 2014-07-20 SURGICAL SUPPLY — 74 items
BAG DECANTER FOR FLEXI CONT (MISCELLANEOUS) ×3 IMPLANT
BENZOIN TINCTURE PRP APPL 2/3 (GAUZE/BANDAGES/DRESSINGS) ×3 IMPLANT
BLADE HEX COATED 2.75 (ELECTRODE) ×3 IMPLANT
BLADE SURG 10 STRL SS (BLADE) IMPLANT
BLADE SURG 15 STRL LF DISP TIS (BLADE) ×1 IMPLANT
BLADE SURG 15 STRL SS (BLADE) ×2
BRIEF STRETCH FOR OB PAD LRG (UNDERPADS AND DIAPERS) IMPLANT
CANISTER SUCT 1200ML W/VALVE (MISCELLANEOUS) ×3 IMPLANT
CHLORAPREP W/TINT 26ML (MISCELLANEOUS) IMPLANT
CLOSURE WOUND 1/2 X4 (GAUZE/BANDAGES/DRESSINGS)
COVER BACK TABLE 60X90IN (DRAPES) ×3 IMPLANT
COVER MAYO STAND STRL (DRAPES) ×3 IMPLANT
DECANTER SPIKE VIAL GLASS SM (MISCELLANEOUS) IMPLANT
DRAIN CHANNEL 19F RND (DRAIN) IMPLANT
DRAIN PENROSE 1/2X12 LTX STRL (WOUND CARE) IMPLANT
DRAPE INCISE IOBAN 66X45 STRL (DRAPES) ×3 IMPLANT
DRAPE LAPAROSCOPIC ABDOMINAL (DRAPES) IMPLANT
DRAPE LAPAROTOMY 100X72 PEDS (DRAPES) IMPLANT
DRAPE U-SHAPE 76X120 STRL (DRAPES) ×3 IMPLANT
DRSG ADAPTIC 3X8 NADH LF (GAUZE/BANDAGES/DRESSINGS) ×3 IMPLANT
DRSG EMULSION OIL 3X3 NADH (GAUZE/BANDAGES/DRESSINGS) IMPLANT
DRSG PAD ABDOMINAL 8X10 ST (GAUZE/BANDAGES/DRESSINGS) IMPLANT
ELECT REM PT RETURN 9FT ADLT (ELECTROSURGICAL) ×3
ELECTRODE REM PT RTRN 9FT ADLT (ELECTROSURGICAL) ×1 IMPLANT
EVACUATOR SILICONE 100CC (DRAIN) IMPLANT
GAUZE SPONGE 4X4 12PLY STRL (GAUZE/BANDAGES/DRESSINGS) ×3 IMPLANT
GAUZE XEROFORM 1X8 LF (GAUZE/BANDAGES/DRESSINGS) IMPLANT
GAUZE XEROFORM 5X9 LF (GAUZE/BANDAGES/DRESSINGS) IMPLANT
GLOVE BIO SURGEON STRL SZ 6.5 (GLOVE) ×6 IMPLANT
GLOVE BIO SURGEON STRL SZ8.5 (GLOVE) ×3 IMPLANT
GLOVE BIO SURGEONS STRL SZ 6.5 (GLOVE) ×3
GLOVE BIOGEL PI IND STRL 8.5 (GLOVE) ×1 IMPLANT
GLOVE BIOGEL PI INDICATOR 8.5 (GLOVE) ×2
GLOVE SURG SS PI 7.0 STRL IVOR (GLOVE) ×3 IMPLANT
GOWN STRL REUS W/ TWL LRG LVL3 (GOWN DISPOSABLE) ×4 IMPLANT
GOWN STRL REUS W/TWL LRG LVL3 (GOWN DISPOSABLE) ×8
IV NS IRRIG 3000ML ARTHROMATIC (IV SOLUTION) IMPLANT
MANIFOLD NEPTUNE II (INSTRUMENTS) IMPLANT
MATRIX SURGICAL PSM 7X10CM (Tissue) ×3 IMPLANT
MICROMATRIX 1000MG (Tissue) ×3 IMPLANT
NEEDLE HYPO 25X1 1.5 SAFETY (NEEDLE) IMPLANT
NEEDLE PRECISIONGLIDE 27X1.5 (NEEDLE) IMPLANT
NS IRRIG 1000ML POUR BTL (IV SOLUTION) IMPLANT
PACK BASIN DAY SURGERY FS (CUSTOM PROCEDURE TRAY) ×3 IMPLANT
PENCIL BUTTON HOLSTER BLD 10FT (ELECTRODE) ×3 IMPLANT
PIN SAFETY STERILE (MISCELLANEOUS) IMPLANT
SHEET MEDIUM DRAPE 40X70 STRL (DRAPES) IMPLANT
SLEEVE SCD COMPRESS KNEE MED (MISCELLANEOUS) ×3 IMPLANT
SOLUTION PARTIC MCRMTRX 1000MG (Tissue) ×1 IMPLANT
SPONGE GAUZE 4X4 12PLY STER LF (GAUZE/BANDAGES/DRESSINGS) IMPLANT
SPONGE LAP 18X18 X RAY DECT (DISPOSABLE) ×3 IMPLANT
STAPLER VISISTAT 35W (STAPLE) ×3 IMPLANT
STRIP CLOSURE SKIN 1/2X4 (GAUZE/BANDAGES/DRESSINGS) IMPLANT
SUCTION FRAZIER TIP 10 FR DISP (SUCTIONS) IMPLANT
SURGILUBE 2OZ TUBE FLIPTOP (MISCELLANEOUS) ×3 IMPLANT
SUT MNCRL AB 4-0 PS2 18 (SUTURE) IMPLANT
SUT MON AB 3-0 SH 27 (SUTURE)
SUT MON AB 3-0 SH27 (SUTURE) IMPLANT
SUT SILK 3 0 PS 1 (SUTURE) IMPLANT
SUT VIC AB 3-0 FS2 27 (SUTURE) IMPLANT
SUT VIC AB 5-0 PS2 18 (SUTURE) IMPLANT
SUT VICRYL 4-0 PS2 18IN ABS (SUTURE) IMPLANT
SWAB COLLECTION DEVICE MRSA (MISCELLANEOUS) IMPLANT
SYR BULB IRRIGATION 50ML (SYRINGE) ×3 IMPLANT
SYR CONTROL 10ML LL (SYRINGE) IMPLANT
TAPE HYPAFIX 6 X30' (GAUZE/BANDAGES/DRESSINGS)
TAPE HYPAFIX 6X30 (GAUZE/BANDAGES/DRESSINGS) IMPLANT
TOWEL OR 17X24 6PK STRL BLUE (TOWEL DISPOSABLE) ×3 IMPLANT
TRAY DSU PREP LF (CUSTOM PROCEDURE TRAY) ×3 IMPLANT
TUBE ANAEROBIC SPECIMEN COL (MISCELLANEOUS) IMPLANT
TUBE CONNECTING 20'X1/4 (TUBING) ×1
TUBE CONNECTING 20X1/4 (TUBING) ×2 IMPLANT
UNDERPAD 30X30 INCONTINENT (UNDERPADS AND DIAPERS) IMPLANT
YANKAUER SUCT BULB TIP NO VENT (SUCTIONS) ×3 IMPLANT

## 2014-07-20 NOTE — Anesthesia Postprocedure Evaluation (Signed)
  Anesthesia Post-op Note  Patient: Eric Shaw  Procedure(s) Performed: Procedure(s): IRRIGATION AND DEBRIDEMENT OF NECROTIZING FASCIITIS WOUND OF NECK WITH PLACEMENT OF ACELL/VAC (N/A) APPLICATION OF A-CELL OF HEAD/NECK (N/A)  Patient Location: PACU  Anesthesia Type:General  Level of Consciousness: awake and alert   Airway and Oxygen Therapy: Patient Spontanous Breathing  Post-op Pain: mild  Post-op Assessment: Post-op Vital signs reviewed  Post-op Vital Signs: Reviewed  Last Vitals:  Filed Vitals:   07/20/14 1214  BP: 146/73  Pulse: 90  Temp: 36.9 C  Resp: 16    Complications: No apparent anesthesia complications

## 2014-07-20 NOTE — Transfer of Care (Signed)
Immediate Anesthesia Transfer of Care Note  Patient: Eric Shaw  Procedure(s) Performed: Procedure(s): IRRIGATION AND DEBRIDEMENT OF NECROTIZING FASCIITIS WOUND OF NECK WITH PLACEMENT OF ACELL/VAC (N/A) APPLICATION OF A-CELL OF HEAD/NECK (N/A)  Patient Location: PACU  Anesthesia Type:General  Level of Consciousness: awake, alert  and patient cooperative  Airway & Oxygen Therapy: Patient Spontanous Breathing and Patient connected to face mask oxygen  Post-op Assessment: Report given to RN, Post -op Vital signs reviewed and stable and Patient moving all extremities  Post vital signs: Reviewed and stable  Last Vitals:  Filed Vitals:   07/20/14 0744  BP: 150/82  Pulse: 85  Temp: 36.8 C  Resp: 20    Complications: No apparent anesthesia complications

## 2014-07-20 NOTE — Discharge Instructions (Signed)
Continue with the VAC and no changes    Post Anesthesia Home Care Instructions  Activity: Get plenty of rest for the remainder of the day. A responsible adult should stay with you for 24 hours following the procedure.  For the next 24 hours, DO NOT: -Drive a car -Advertising copywriterperate machinery -Drink alcoholic beverages -Take any medication unless instructed by your physician -Make any legal decisions or sign important papers.  Meals: Start with liquid foods such as gelatin or soup. Progress to regular foods as tolerated. Avoid greasy, spicy, heavy foods. If nausea and/or vomiting occur, drink only clear liquids until the nausea and/or vomiting subsides. Call your physician if vomiting continues.  Special Instructions/Symptoms: Your throat may feel dry or sore from the anesthesia or the breathing tube placed in your throat during surgery. If this causes discomfort, gargle with warm salt water. The discomfort should disappear within 24 hours.

## 2014-07-20 NOTE — H&P (View-Only) (Signed)
Day of Surgery  Subjective: To OR today. Patient seen in holding.  Objective: Vital signs in last 24 hours: Temp:  [98 F (36.7 C)-98.7 F (37.1 C)] 98.5 F (36.9 C) (03/02 0551) Pulse Rate:  [92-107] 92 (03/02 0551) Resp:  [18-20] 18 (03/02 0551) BP: (127-133)/(47-99) 133/88 mmHg (03/02 0551) SpO2:  [97 %-100 %] 97 % (03/02 0551) Weight:  [93 kg (205 lb 0.4 oz)] 93 kg (205 lb 0.4 oz) (03/02 0551) Last BM Date: 07/04/14  Intake/Output from previous day: 03/01 0701 - 03/02 0700 In: 610 [P.O.:480; I.V.:130] Out: 2000 [Urine:2000] Intake/Output this shift: Total I/O In: 1000 [I.V.:1000] Out: 0   General appearance: alert, cooperative and VAC in place Incision/Wound:  Lab Results:   Recent Labs  07/05/14 0525  WBC 7.3  HGB 11.7*  HCT 35.6*  PLT 500*   BMET  Recent Labs  07/05/14 0525  NA 135  K 4.0  CL 100  CO2 27  GLUCOSE 163*  BUN 6  CREATININE 0.56  CALCIUM 9.0   PT/INR No results for input(s): LABPROT, INR in the last 72 hours. ABG No results for input(s): PHART, HCO3 in the last 72 hours.  Invalid input(s): PCO2, PO2  Studies/Results: No results found.  Anti-infectives: Anti-infectives    Start     Dose/Rate Route Frequency Ordered Stop   07/05/14 2200  ceFAZolin (ANCEF) IVPB 2 g/50 mL premix     2 g 100 mL/hr over 30 Minutes Intravenous 3 times per day 07/05/14 1253     07/05/14 1303  polymyxin B 500,000 Units, bacitracin 50,000 Units in sodium chloride irrigation 0.9 % 500 mL irrigation       As needed 07/05/14 1304     07/05/14 1030  ceFAZolin (ANCEF) IVPB 2 g/50 mL premix  Status:  Discontinued     2 g 100 mL/hr over 30 Minutes Intravenous Every 8 hours 07/05/14 1019 07/05/14 1253   06/25/14 1400  ceFAZolin (ANCEF) IVPB 2 g/50 mL premix     2 g 100 mL/hr over 30 Minutes Intravenous 3 times per day 06/25/14 1134 07/04/14 2215   06/22/14 1400  ceFAZolin (ANCEF) IVPB 2 g/50 mL premix  Status:  Discontinued     2 g 100 mL/hr over 30  Minutes Intravenous 3 times per day 06/22/14 1011 06/24/14 1454   06/21/14 1800  ceFAZolin (ANCEF) IVPB 2 g/50 mL premix  Status:  Discontinued     2 g 100 mL/hr over 30 Minutes Intravenous 4 times per day 06/21/14 1505 06/22/14 1011   06/20/14 2000  vancomycin (VANCOCIN) IVPB 1000 mg/200 mL premix  Status:  Discontinued     1,000 mg 200 mL/hr over 60 Minutes Intravenous Every 12 hours 06/20/14 1123 06/21/14 1503   06/20/14 0800  vancomycin (VANCOCIN) IVPB 750 mg/150 ml premix  Status:  Discontinued     750 mg 150 mL/hr over 60 Minutes Intravenous Every 12 hours 06/19/14 1636 06/20/14 1123   06/20/14 0800  ciprofloxacin (CIPRO) IVPB 400 mg  Status:  Discontinued     400 mg 200 mL/hr over 60 Minutes Intravenous Every 12 hours 06/20/14 0735 06/21/14 0925   06/20/14 0100  piperacillin-tazobactam (ZOSYN) IVPB 3.375 g  Status:  Discontinued     3.375 g 12.5 mL/hr over 240 Minutes Intravenous Every 8 hours 06/19/14 1820 06/21/14 1503   06/19/14 1830  piperacillin-tazobactam (ZOSYN) IVPB 3.375 g     3.375 g 100 mL/hr over 30 Minutes Intravenous  Once 06/19/14 1812 06/19/14 1922   06/19/14  1645  vancomycin (VANCOCIN) 2,000 mg in sodium chloride 0.9 % 500 mL IVPB     2,000 mg 250 mL/hr over 120 Minutes Intravenous  Once 06/19/14 1634 06/19/14 1913      Assessment/Plan: s/p Procedure(s): APPLICATION OF WOUND VAC (N/A) IRRIGATION AND DEBRIDEMENT WOUND (N/A) APPLICATION OF A-CELL OF HEAD/NECK (N/A) Discharge per primary team when home health and home VAC arranged.  Will need once a week VAC check but do not change unless needed or leak.  VAC at 100 mmHg continuous pressure (can increase to 125 mmHg if needed).  LOS: 16 days    [facility] HospitalANGER,Harol Shabazz 07/05/2014

## 2014-07-20 NOTE — Anesthesia Procedure Notes (Signed)
Procedure Name: Intubation Date/Time: 07/20/2014 8:54 AM Performed by: Curly ShoresRAFT, Jamileth Putzier W Pre-anesthesia Checklist: Patient identified, Emergency Drugs available, Suction available and Patient being monitored Patient Re-evaluated:Patient Re-evaluated prior to inductionOxygen Delivery Method: Circle System Utilized Preoxygenation: Pre-oxygenation with 100% oxygen Intubation Type: IV induction Ventilation: Mask ventilation without difficulty Laryngoscope Size: Glidescope and 4 Grade View: Grade I Tube type: Oral Tube size: 8.0 mm Number of attempts: 1 Airway Equipment and Method: Stylet and Video-laryngoscopy Placement Confirmation: ETT inserted through vocal cords under direct vision,  positive ETCO2 and breath sounds checked- equal and bilateral Secured at: 22 cm Tube secured with: Tape Dental Injury: Teeth and Oropharynx as per pre-operative assessment

## 2014-07-20 NOTE — Brief Op Note (Signed)
07/20/2014  9:26 AM  PATIENT:  Eric Shaw  54 y.o. male  PRE-OPERATIVE DIAGNOSIS:  WOUND OF NECK  POST-OPERATIVE DIAGNOSIS:  WOUND OF NECK  PROCEDURE:  Procedure(s): IRRIGATION AND DEBRIDEMENT OF NECROTIZING FASCIITIS WOUND OF NECK WITH PLACEMENT OF ACELL/VAC (N/A) APPLICATION OF A-CELL OF HEAD/NECK (N/A)  SURGEON:  Surgeon(s) and Role:    * Claire Sanger, DO - Primary  PHYSICIAN ASSISTANT: Shawn Rayburn, PA  ASSISTANTS: none   ANESTHESIA:   general  EBL:  Total I/O In: 1200 [I.V.:1200] Out: -   BLOOD ADMINISTERED:none  DRAINS: none   LOCAL MEDICATIONS USED:  NONE  SPECIMEN:  No Specimen  DISPOSITION OF SPECIMEN:  N/A  COUNTS:  YES  TOURNIQUET:  * No tourniquets in log *  DICTATION: .Dragon Dictation  PLAN OF CARE: Discharge to home after PACU  PATIENT DISPOSITION:  PACU - hemodynamically stable.   Delay start of Pharmacological VTE agent (>24hrs) due to surgical blood loss or risk of bleeding: no

## 2014-07-20 NOTE — Anesthesia Preprocedure Evaluation (Addendum)
Anesthesia Evaluation  Patient identified by MRN, date of birth, ID band Patient awake    Reviewed: Allergy & Precautions, NPO status , Patient's Chart, lab work & pertinent test results  History of Anesthesia Complications (+) DIFFICULT AIRWAY  Airway Mallampati: II  TM Distance: >3 FB Neck ROM: Limited    Dental  (+) Teeth Intact, Dental Advisory Given   Pulmonary sleep apnea , Current Smoker,  breath sounds clear to auscultation  + decreased breath sounds      Cardiovascular hypertension, Pt. on medications Rhythm:Regular Rate:Normal     Neuro/Psych Depression negative neurological ROS     GI/Hepatic negative GI ROS, Neg liver ROS,   Endo/Other  diabetes, Poorly Controlled, Insulin Dependent, Oral Hypoglycemic AgentsMorbid obesity  Renal/GU negative Renal ROS     Musculoskeletal  (+) Arthritis -,   Abdominal Normal abdominal exam  (+)   Peds  Hematology   Anesthesia Other Findings   Reproductive/Obstetrics                           Anesthesia Physical  Anesthesia Plan  ASA: II  Anesthesia Plan: General   Post-op Pain Management:    Induction: Intravenous  Airway Management Planned: Oral ETT and Video Laryngoscope Planned  Additional Equipment:   Intra-op Plan:   Post-operative Plan: Extubation in OR  Informed Consent: I have reviewed the patients History and Physical, chart, labs and discussed the procedure including the risks, benefits and alternatives for the proposed anesthesia with the patient or authorized representative who has indicated his/her understanding and acceptance.   Dental advisory given  Plan Discussed with: CRNA  Anesthesia Plan Comments:        Anesthesia Quick Evaluation

## 2014-07-20 NOTE — Interval H&P Note (Signed)
History and Physical Interval Note:  07/20/2014 7:09 AM  Eric Shaw  has presented today for surgery, with the diagnosis of WOUND OF NECK  The various methods of treatment have been discussed with the patient and family. After consideration of risks, benefits and other options for treatment, the patient has consented to  Procedure(s): IRRIGATION AND DEBRIDEMENT OF NECROTIZING FASCIITIS WOUND OF NECK WITH PLACEMENT OF ACELL/VAC (N/A) APPLICATION OF A-CELL OF HEAD/NECK (N/A) as a surgical intervention .  The patient's history has been reviewed, patient examined, no change in status, stable for surgery.  I have reviewed the patient's chart and labs.  Questions were answered to the patient's satisfaction.     SANGER,Dock Baccam

## 2014-07-20 NOTE — Op Note (Signed)
Operative Note   DATE OF OPERATION: 07/20/2014  [facility]  SURGICAL DIVISION: Plastic Surgery  PREOPERATIVE DIAGNOSES:  Neck infection / wound (6.7 x 12 x 1 cm)  POSTOPERATIVE DIAGNOSES:  same  PROCEDURE:  Preparation of neck wound for placement of Acell (powder 1 gm and 7 x 10 cm sheet) and VAC   SURGEON: Dr. Pollyann Kennedyosen  ASSISTANT: Alan Ripperlaire Sanger, DO  ANESTHESIA:  General.   COMPLICATIONS: None.   INDICATIONS FOR PROCEDURE:  The patient, Eric Shaw is a 54 y.o. male born on 17-Nov-1960, is here for treatment of a large neck ulcer from infection.  He was debrided several times by ENT and we were consulted for co-care of the patient. MRN: [id number]  CONSENT:  Informed consent was obtained directly from the patient. Risks, benefits and alternatives were fully discussed. Specific risks including but not limited to bleeding, infection, hematoma, seroma, scarring, pain, infection, contracture, asymmetry, wound healing problems, and need for further surgery were all discussed. The patient did have an ample opportunity to have questions answered to satisfaction.   DESCRIPTION OF PROCEDURE:  The patient was taken to the operating room. SCDs were placed and IV antibiotics were given. The patient's operative site was prepped and draped in a sterile fashion. A time out was performed and all information was confirmed to be correct.  General anesthesia was administered.  The area was irrigated with normal saline and antibiotic solution (6.7 x 12 cm).  The Acell powder was sprinkled over the entire wound (1 gm).  The sheet was prepared and placed over the wound 7 x 10 cm covered.  The entire sheet was utilized.  The adaptic was applied and secured with a staple.  The surgical lube and VAC were placed.  An excellent seal was obtained.  The patient tolerated the procedure well.  There were no complications. The patient was allowed to wake from anesthesia, extubated and  taken to the recovery room in satisfactory condition.

## 2014-07-21 ENCOUNTER — Encounter (HOSPITAL_BASED_OUTPATIENT_CLINIC_OR_DEPARTMENT_OTHER): Payer: Self-pay | Admitting: Plastic Surgery

## 2014-07-21 ENCOUNTER — Telehealth: Payer: Self-pay | Admitting: Emergency Medicine

## 2014-07-21 NOTE — Telephone Encounter (Signed)
Needs PCP signature, home health cert and plan of care

## 2014-07-31 ENCOUNTER — Other Ambulatory Visit: Payer: Self-pay | Admitting: Plastic Surgery

## 2014-07-31 ENCOUNTER — Encounter (HOSPITAL_BASED_OUTPATIENT_CLINIC_OR_DEPARTMENT_OTHER): Payer: Self-pay | Admitting: *Deleted

## 2014-07-31 DIAGNOSIS — S1190XA Unspecified open wound of unspecified part of neck, initial encounter: Secondary | ICD-10-CM

## 2014-07-31 NOTE — Progress Notes (Signed)
k 3.3 last labs 07/18/14-will ck istat

## 2014-08-01 ENCOUNTER — Encounter (HOSPITAL_BASED_OUTPATIENT_CLINIC_OR_DEPARTMENT_OTHER): Payer: Self-pay | Admitting: *Deleted

## 2014-08-02 ENCOUNTER — Ambulatory Visit (HOSPITAL_BASED_OUTPATIENT_CLINIC_OR_DEPARTMENT_OTHER)
Admission: RE | Admit: 2014-08-02 | Discharge: 2014-08-02 | Disposition: A | Discharge status: Home / Self Care | Payer: Self-pay | Source: Ambulatory Visit | Attending: Plastic Surgery | Admitting: Plastic Surgery

## 2014-08-02 ENCOUNTER — Ambulatory Visit (HOSPITAL_BASED_OUTPATIENT_CLINIC_OR_DEPARTMENT_OTHER): Payer: MEDICAID | Admitting: Anesthesiology

## 2014-08-02 ENCOUNTER — Encounter (HOSPITAL_BASED_OUTPATIENT_CLINIC_OR_DEPARTMENT_OTHER): Payer: Self-pay | Admitting: Plastic Surgery

## 2014-08-02 ENCOUNTER — Ambulatory Visit (HOSPITAL_BASED_OUTPATIENT_CLINIC_OR_DEPARTMENT_OTHER): Payer: Self-pay | Admitting: Anesthesiology

## 2014-08-02 ENCOUNTER — Encounter (HOSPITAL_BASED_OUTPATIENT_CLINIC_OR_DEPARTMENT_OTHER)
Admission: RE | Disposition: A | Discharge status: Home / Self Care | Payer: Self-pay | Source: Ambulatory Visit | Attending: Plastic Surgery

## 2014-08-02 DIAGNOSIS — F17200 Nicotine dependence, unspecified, uncomplicated: Secondary | ICD-10-CM | POA: Insufficient documentation

## 2014-08-02 DIAGNOSIS — I10 Essential (primary) hypertension: Secondary | ICD-10-CM | POA: Insufficient documentation

## 2014-08-02 DIAGNOSIS — E119 Type 2 diabetes mellitus without complications: Secondary | ICD-10-CM | POA: Insufficient documentation

## 2014-08-02 DIAGNOSIS — S1190XA Unspecified open wound of unspecified part of neck, initial encounter: Secondary | ICD-10-CM

## 2014-08-02 DIAGNOSIS — Z79899 Other long term (current) drug therapy: Secondary | ICD-10-CM | POA: Insufficient documentation

## 2014-08-02 DIAGNOSIS — L98499 Non-pressure chronic ulcer of skin of other sites with unspecified severity: Secondary | ICD-10-CM | POA: Insufficient documentation

## 2014-08-02 HISTORY — PX: APPLICATION OF A-CELL OF HEAD/NECK: SHX6304

## 2014-08-02 HISTORY — PX: INCISION AND DRAINAGE OF WOUND: SHX1803

## 2014-08-02 LAB — GLUCOSE, CAPILLARY: Glucose-Capillary: 295 mg/dL — ABNORMAL HIGH (ref 70–99)

## 2014-08-02 LAB — POCT I-STAT, CHEM 8
BUN: 16 mg/dL (ref 6–23)
CHLORIDE: 103 mmol/L (ref 96–112)
CREATININE: 0.6 mg/dL (ref 0.50–1.35)
Calcium, Ion: 1.17 mmol/L (ref 1.12–1.23)
Glucose, Bld: 285 mg/dL — ABNORMAL HIGH (ref 70–99)
HEMATOCRIT: 47 % (ref 39.0–52.0)
Hemoglobin: 16 g/dL (ref 13.0–17.0)
POTASSIUM: 3.9 mmol/L (ref 3.5–5.1)
SODIUM: 139 mmol/L (ref 135–145)
TCO2: 23 mmol/L (ref 0–100)

## 2014-08-02 SURGERY — IRRIGATION AND DEBRIDEMENT WOUND
Anesthesia: General | Site: Neck

## 2014-08-02 MED ORDER — FENTANYL CITRATE 0.05 MG/ML IJ SOLN: 50.0000 ug | INTRAMUSCULAR | Status: DC | PRN

## 2014-08-02 MED ORDER — MIDAZOLAM HCL 2 MG/2ML IJ SOLN
INTRAMUSCULAR | Status: AC
  Filled 2014-08-02: qty 2

## 2014-08-02 MED ORDER — LIDOCAINE HCL (CARDIAC) 20 MG/ML IV SOLN
INTRAVENOUS | Status: DC | PRN
  Administered 2014-08-02: 100 mg via INTRAVENOUS

## 2014-08-02 MED ORDER — POLYMYXIN B SULFATE 500000 UNITS IJ SOLR
INTRAMUSCULAR | Status: DC | PRN
  Administered 2014-08-02: 500 mL

## 2014-08-02 MED ORDER — CEFAZOLIN SODIUM-DEXTROSE 2-3 GM-% IV SOLR
2.0000 g | INTRAVENOUS | Status: AC
  Administered 2014-08-02: 2 g via INTRAVENOUS

## 2014-08-02 MED ORDER — PROPOFOL 10 MG/ML IV BOLUS
INTRAVENOUS | Status: DC | PRN
  Administered 2014-08-02: 300 mg via INTRAVENOUS

## 2014-08-02 MED ORDER — MIDAZOLAM HCL 5 MG/5ML IJ SOLN
INTRAMUSCULAR | Status: DC | PRN
  Administered 2014-08-02: 2 mg via INTRAVENOUS

## 2014-08-02 MED ORDER — ONDANSETRON HCL 4 MG/2ML IJ SOLN
INTRAMUSCULAR | Status: DC | PRN
  Administered 2014-08-02: 4 mg via INTRAVENOUS

## 2014-08-02 MED ORDER — FENTANYL CITRATE 0.05 MG/ML IJ SOLN
INTRAMUSCULAR | Status: AC
Start: 2014-08-02 — End: 2014-08-02
  Filled 2014-08-02: qty 6

## 2014-08-02 MED ORDER — SUCCINYLCHOLINE CHLORIDE 20 MG/ML IJ SOLN
INTRAMUSCULAR | Status: DC | PRN
  Administered 2014-08-02: 120 mg via INTRAVENOUS

## 2014-08-02 MED ORDER — MIDAZOLAM HCL 2 MG/2ML IJ SOLN: 1.0000 mg | INTRAMUSCULAR | Status: DC | PRN

## 2014-08-02 MED ORDER — LACTATED RINGERS IV SOLN
INTRAVENOUS | Status: DC
  Administered 2014-08-02: 09:00:00 via INTRAVENOUS

## 2014-08-02 MED ORDER — FENTANYL CITRATE 0.05 MG/ML IJ SOLN
INTRAMUSCULAR | Status: DC | PRN
Start: 2014-08-02 — End: 2014-08-02
  Administered 2014-08-02 (×2): 100 ug via INTRAVENOUS

## 2014-08-02 SURGICAL SUPPLY — 70 items
BAG DECANTER FOR FLEXI CONT (MISCELLANEOUS) ×3 IMPLANT
BENZOIN TINCTURE PRP APPL 2/3 (GAUZE/BANDAGES/DRESSINGS) IMPLANT
BLADE HEX COATED 2.75 (ELECTRODE) IMPLANT
BLADE SURG 10 STRL SS (BLADE) IMPLANT
BLADE SURG 15 STRL LF DISP TIS (BLADE) ×1 IMPLANT
BLADE SURG 15 STRL SS (BLADE) ×2
BRIEF STRETCH FOR OB PAD LRG (UNDERPADS AND DIAPERS) IMPLANT
CANISTER SUCT 1200ML W/VALVE (MISCELLANEOUS) IMPLANT
CHLORAPREP W/TINT 26ML (MISCELLANEOUS) IMPLANT
CLOSURE WOUND 1/2 X4 (GAUZE/BANDAGES/DRESSINGS)
COVER BACK TABLE 60X90IN (DRAPES) ×3 IMPLANT
COVER MAYO STAND STRL (DRAPES) ×3 IMPLANT
DECANTER SPIKE VIAL GLASS SM (MISCELLANEOUS) IMPLANT
DRAIN CHANNEL 19F RND (DRAIN) IMPLANT
DRAIN PENROSE 1/2X12 LTX STRL (WOUND CARE) IMPLANT
DRAPE INCISE IOBAN 66X45 STRL (DRAPES) ×6 IMPLANT
DRAPE LAPAROSCOPIC ABDOMINAL (DRAPES) IMPLANT
DRAPE LAPAROTOMY 100X72 PEDS (DRAPES) IMPLANT
DRAPE U-SHAPE 76X120 STRL (DRAPES) ×3 IMPLANT
DRSG ADAPTIC 3X8 NADH LF (GAUZE/BANDAGES/DRESSINGS) ×3 IMPLANT
DRSG EMULSION OIL 3X3 NADH (GAUZE/BANDAGES/DRESSINGS) IMPLANT
DRSG PAD ABDOMINAL 8X10 ST (GAUZE/BANDAGES/DRESSINGS) IMPLANT
ELECT REM PT RETURN 9FT ADLT (ELECTROSURGICAL) ×3
ELECTRODE REM PT RTRN 9FT ADLT (ELECTROSURGICAL) ×1 IMPLANT
EVACUATOR SILICONE 100CC (DRAIN) IMPLANT
GAUZE SPONGE 4X4 12PLY STRL (GAUZE/BANDAGES/DRESSINGS) ×3 IMPLANT
GAUZE XEROFORM 1X8 LF (GAUZE/BANDAGES/DRESSINGS) IMPLANT
GAUZE XEROFORM 5X9 LF (GAUZE/BANDAGES/DRESSINGS) IMPLANT
GLOVE BIO SURGEON STRL SZ 6.5 (GLOVE) ×4 IMPLANT
GLOVE BIO SURGEONS STRL SZ 6.5 (GLOVE) ×2
GOWN STRL REUS W/ TWL LRG LVL3 (GOWN DISPOSABLE) ×2 IMPLANT
GOWN STRL REUS W/TWL LRG LVL3 (GOWN DISPOSABLE) ×4
IV NS IRRIG 3000ML ARTHROMATIC (IV SOLUTION) IMPLANT
MANIFOLD NEPTUNE II (INSTRUMENTS) IMPLANT
MATRIX SURGICAL PSM 7X10CM (Tissue) ×3 IMPLANT
MICROMATRIX 1000MG (Tissue) ×3 IMPLANT
NEEDLE HYPO 25X1 1.5 SAFETY (NEEDLE) IMPLANT
NEEDLE PRECISIONGLIDE 27X1.5 (NEEDLE) IMPLANT
NS IRRIG 1000ML POUR BTL (IV SOLUTION) ×3 IMPLANT
PACK BASIN DAY SURGERY FS (CUSTOM PROCEDURE TRAY) ×3 IMPLANT
PENCIL BUTTON HOLSTER BLD 10FT (ELECTRODE) ×3 IMPLANT
PIN SAFETY STERILE (MISCELLANEOUS) IMPLANT
SHEET MEDIUM DRAPE 40X70 STRL (DRAPES) IMPLANT
SLEEVE SCD COMPRESS KNEE MED (MISCELLANEOUS) IMPLANT
SOLUTION PARTIC MCRMTRX 1000MG (Tissue) ×1 IMPLANT
SPONGE GAUZE 4X4 12PLY STER LF (GAUZE/BANDAGES/DRESSINGS) IMPLANT
SPONGE LAP 18X18 X RAY DECT (DISPOSABLE) IMPLANT
STAPLER VISISTAT 35W (STAPLE) IMPLANT
STRIP CLOSURE SKIN 1/2X4 (GAUZE/BANDAGES/DRESSINGS) IMPLANT
SUCTION FRAZIER TIP 10 FR DISP (SUCTIONS) IMPLANT
SURGILUBE 2OZ TUBE FLIPTOP (MISCELLANEOUS) IMPLANT
SUT MNCRL AB 4-0 PS2 18 (SUTURE) IMPLANT
SUT MON AB 3-0 SH 27 (SUTURE)
SUT MON AB 3-0 SH27 (SUTURE) IMPLANT
SUT SILK 3 0 PS 1 (SUTURE) IMPLANT
SUT VIC AB 3-0 FS2 27 (SUTURE) IMPLANT
SUT VIC AB 5-0 PS2 18 (SUTURE) ×3 IMPLANT
SUT VICRYL 4-0 PS2 18IN ABS (SUTURE) IMPLANT
SWAB COLLECTION DEVICE MRSA (MISCELLANEOUS) IMPLANT
SYR BULB IRRIGATION 50ML (SYRINGE) ×3 IMPLANT
SYR CONTROL 10ML LL (SYRINGE) IMPLANT
TAPE HYPAFIX 6 X30' (GAUZE/BANDAGES/DRESSINGS)
TAPE HYPAFIX 6X30 (GAUZE/BANDAGES/DRESSINGS) IMPLANT
TOWEL OR 17X24 6PK STRL BLUE (TOWEL DISPOSABLE) ×3 IMPLANT
TRAY DSU PREP LF (CUSTOM PROCEDURE TRAY) ×3 IMPLANT
TUBE ANAEROBIC SPECIMEN COL (MISCELLANEOUS) IMPLANT
TUBE CONNECTING 20'X1/4 (TUBING)
TUBE CONNECTING 20X1/4 (TUBING) IMPLANT
UNDERPAD 30X30 INCONTINENT (UNDERPADS AND DIAPERS) IMPLANT
YANKAUER SUCT BULB TIP NO VENT (SUCTIONS) IMPLANT

## 2014-08-02 NOTE — Discharge Instructions (Signed)
Do not change VAC  Call your surgeon if you experience:   1.  Fever over 101.0. 2.  Inability to urinate. 3.  Nausea and/or vomiting. 4.  Extreme swelling or bruising at the surgical site. 5.  Continued bleeding from the incision. 6.  Increased pain, redness or drainage from the incision. 7.  Problems related to your pain medication. 8.  Any problems and/or concerns   Post Anesthesia Home Care Instructions  Activity: Get plenty of rest for the remainder of the day. A responsible adult should stay with you for 24 hours following the procedure.  For the next 24 hours, DO NOT: -Drive a car -Advertising copywriterperate machinery -Drink alcoholic beverages -Take any medication unless instructed by your physician -Make any legal decisions or sign important papers.  Meals: Start with liquid foods such as gelatin or soup. Progress to regular foods as tolerated. Avoid greasy, spicy, heavy foods. If nausea and/or vomiting occur, drink only clear liquids until the nausea and/or vomiting subsides. Call your physician if vomiting continues.  Special Instructions/Symptoms: Your throat may feel dry or sore from the anesthesia or the breathing tube placed in your throat during surgery. If this causes discomfort, gargle with warm salt water. The discomfort should disappear within 24 hours.  If you had a scopolamine patch placed behind your ear for the management of post- operative nausea and/or vomiting:  1. The medication in the patch is effective for 72 hours, after which it should be removed.  Wrap patch in a tissue and discard in the trash. Wash hands thoroughly with soap and water. 2. You may remove the patch earlier than 72 hours if you experience unpleasant side effects which may include dry mouth, dizziness or visual disturbances. 3. Avoid touching the patch. Wash your hands with soap and water after contact with the patch.

## 2014-08-02 NOTE — Transfer of Care (Signed)
Immediate Anesthesia Transfer of Care Note  Patient: Eric Shaw  Procedure(s) Performed: Procedure(s): IRRIGATION AND DEBRIDEMENT OF POSTERIOR NECK WOUND WITH SURGICAL PREP AND PLACEMENT OF A CELL AND VAC (N/A) APPLICATION OF A-CELL OF HEAD/NECK (N/A)  Patient Location: PACU  Anesthesia Type:General  Level of Consciousness: awake and alert   Airway & Oxygen Therapy: Patient Spontanous Breathing  Post-op Assessment: Report given to RN and Post -op Vital signs reviewed and stable  Post vital signs: Reviewed and stable  Last Vitals:  Filed Vitals:   08/02/14 1017  BP:   Pulse: 102  Temp:   Resp: 10    Complications: No apparent anesthesia complications

## 2014-08-02 NOTE — Op Note (Signed)
Operative Note   DATE OF OPERATION: 08/02/2014  [facility]  SURGICAL DIVISION: Plastic Surgery  PREOPERATIVE DIAGNOSES:  Neck infection / wound (6.5 x 10 x .7 cm)  POSTOPERATIVE DIAGNOSES:  same  PROCEDURE:  Preparation of neck wound for placement of Acell (powder 1 gm and 7 x 10 cm sheet) and VAC   SURGEON: Dr. Pollyann Kennedyosen  ASSISTANT: Alan Ripperlaire Sanger, DO  ANESTHESIA:  General.   COMPLICATIONS: None.   INDICATIONS FOR PROCEDURE:  The patient, Eric Shaw is a 54 y.o. male born on [birth year], is here for treatment of a large neck ulcer from infection.  He was debrided several times by ENT and we were consulted for care of the patient. MRN: [id number]  CONSENT:  Informed consent was obtained directly from the patient. Risks, benefits and alternatives were fully discussed. Specific risks including but not limited to bleeding, infection, hematoma, seroma, scarring, pain, infection, contracture, asymmetry, wound healing problems, and need for further surgery were all discussed. The patient did have an ample opportunity to have questions answered to satisfaction.   DESCRIPTION OF PROCEDURE:  The patient was taken to the operating room. SCDs were placed and IV antibiotics were given. The patient's operative site was prepped and draped in a sterile fashion. A time out was performed and all information was confirmed to be correct.  General anesthesia was administered.  The area was irrigated with normal saline and antibiotic solution (6.5 x 10 cm).  The Acell powder was sprinkled over the entire wound (1 gm).  The sheet was prepared and placed over the wound 7 x 10 cm covered.  The entire sheet was utilized.  The adaptic was applied.  The surgical lube and VAC were placed.  An excellent seal was obtained.  The patient tolerated the procedure well.  There were no complications. The patient was allowed to wake from anesthesia, extubated and taken to the recovery room in  satisfactory condition.

## 2014-08-02 NOTE — Anesthesia Preprocedure Evaluation (Signed)
Anesthesia Evaluation  Patient identified by MRN, date of birth, ID band Patient awake    Reviewed: Allergy & Precautions, NPO status , Patient's Chart, lab work & pertinent test results  Airway Mallampati: I  TM Distance: >3 FB Neck ROM: Full    Dental  (+) Edentulous Upper, Dental Advisory Given   Pulmonary Current Smoker,  breath sounds clear to auscultation        Cardiovascular hypertension, Pt. on medications Rhythm:Regular Rate:Normal     Neuro/Psych    GI/Hepatic   Endo/Other  diabetes, Well Controlled, Type 2, Oral Hypoglycemic Agents  Renal/GU      Musculoskeletal   Abdominal   Peds  Hematology   Anesthesia Other Findings   Reproductive/Obstetrics                             Anesthesia Physical Anesthesia Plan  ASA: III  Anesthesia Plan: General   Post-op Pain Management:    Induction: Intravenous  Airway Management Planned: LMA  Additional Equipment:   Intra-op Plan:   Post-operative Plan: Extubation in OR  Informed Consent: I have reviewed the patients History and Physical, chart, labs and discussed the procedure including the risks, benefits and alternatives for the proposed anesthesia with the patient or authorized representative who has indicated his/her understanding and acceptance.   Dental advisory given  Plan Discussed with: CRNA, Anesthesiologist and Surgeon  Anesthesia Plan Comments:         Anesthesia Quick Evaluation

## 2014-08-02 NOTE — Interval H&P Note (Signed)
History and Physical Interval Note:  08/02/2014 7:58 AM  Eric Shaw  has presented today for surgery, with the diagnosis of open neck wound  The various methods of treatment have been discussed with the patient and family. After consideration of risks, benefits and other options for treatment, the patient has consented to  Procedure(s): IRRIGATION AND DEBRIDEMENT OF POSTERIOR NECK WOUND WITH SURGICAL PREP AND PLACEMENT OF A CELL AND VAC (N/A) APPLICATION OF A-CELL OF HEAD/NECK (N/A) as a surgical intervention .  The patient's history has been reviewed, patient examined, no change in status, stable for surgery.  I have reviewed the patient's chart and labs.  Questions were answered to the patient's satisfaction.     SANGER,CLAIRE

## 2014-08-02 NOTE — Brief Op Note (Signed)
08/02/2014  10:07 AM  PATIENT:  Eric Shaw  54 y.o. male  PRE-OPERATIVE DIAGNOSIS:  open neck wound  POST-OPERATIVE DIAGNOSIS:  open neck wound  PROCEDURE:  Procedure(s): IRRIGATION AND DEBRIDEMENT OF POSTERIOR NECK WOUND WITH SURGICAL PREP AND PLACEMENT OF A CELL AND VAC (N/A) APPLICATION OF A-CELL OF HEAD/NECK (N/A)  SURGEON:  Surgeon(s) and Role:    * Adalind Weitz Sanger, DO - Primary  ASSISTANTS: none   ANESTHESIA:   general  EBL:     BLOOD ADMINISTERED:none  DRAINS: none   LOCAL MEDICATIONS USED:  NONE  SPECIMEN:  No Specimen  DISPOSITION OF SPECIMEN:  N/A  COUNTS:  YES  TOURNIQUET:  * No tourniquets in log *  DICTATION: .Dragon Dictation  PLAN OF CARE: Discharge to home after PACU  PATIENT DISPOSITION:  PACU - hemodynamically stable.   Delay start of Pharmacological VTE agent (>24hrs) due to surgical blood loss or risk of bleeding: no

## 2014-08-02 NOTE — H&P (View-Only) (Signed)
Day of Surgery  Subjective: To OR today. Patient seen in holding.  Objective: Vital signs in last 24 hours: Temp:  [98 F (36.7 C)-98.7 F (37.1 C)] 98.5 F (36.9 C) (03/02 0551) Pulse Rate:  [92-107] 92 (03/02 0551) Resp:  [18-20] 18 (03/02 0551) BP: (127-133)/(47-99) 133/88 mmHg (03/02 0551) SpO2:  [97 %-100 %] 97 % (03/02 0551) Weight:  [93 kg (205 lb 0.4 oz)] 93 kg (205 lb 0.4 oz) (03/02 0551) Last BM Date: 07/04/14  Intake/Output from previous day: 03/01 0701 - 03/02 0700 In: 610 [P.O.:480; I.V.:130] Out: 2000 [Urine:2000] Intake/Output this shift: Total I/O In: 1000 [I.V.:1000] Out: 0   General appearance: alert, cooperative and VAC in place Incision/Wound:  Lab Results:   Recent Labs  07/05/14 0525  WBC 7.3  HGB 11.7*  HCT 35.6*  PLT 500*   BMET  Recent Labs  07/05/14 0525  NA 135  K 4.0  CL 100  CO2 27  GLUCOSE 163*  BUN 6  CREATININE 0.56  CALCIUM 9.0   PT/INR No results for input(s): LABPROT, INR in the last 72 hours. ABG No results for input(s): PHART, HCO3 in the last 72 hours.  Invalid input(s): PCO2, PO2  Studies/Results: No results found.  Anti-infectives: Anti-infectives    Start     Dose/Rate Route Frequency Ordered Stop   07/05/14 2200  ceFAZolin (ANCEF) IVPB 2 g/50 mL premix     2 g 100 mL/hr over 30 Minutes Intravenous 3 times per day 07/05/14 1253     07/05/14 1303  polymyxin B 500,000 Units, bacitracin 50,000 Units in sodium chloride irrigation 0.9 % 500 mL irrigation       As needed 07/05/14 1304     07/05/14 1030  ceFAZolin (ANCEF) IVPB 2 g/50 mL premix  Status:  Discontinued     2 g 100 mL/hr over 30 Minutes Intravenous Every 8 hours 07/05/14 1019 07/05/14 1253   06/25/14 1400  ceFAZolin (ANCEF) IVPB 2 g/50 mL premix     2 g 100 mL/hr over 30 Minutes Intravenous 3 times per day 06/25/14 1134 07/04/14 2215   06/22/14 1400  ceFAZolin (ANCEF) IVPB 2 g/50 mL premix  Status:  Discontinued     2 g 100 mL/hr over 30  Minutes Intravenous 3 times per day 06/22/14 1011 06/24/14 1454   06/21/14 1800  ceFAZolin (ANCEF) IVPB 2 g/50 mL premix  Status:  Discontinued     2 g 100 mL/hr over 30 Minutes Intravenous 4 times per day 06/21/14 1505 06/22/14 1011   06/20/14 2000  vancomycin (VANCOCIN) IVPB 1000 mg/200 mL premix  Status:  Discontinued     1,000 mg 200 mL/hr over 60 Minutes Intravenous Every 12 hours 06/20/14 1123 06/21/14 1503   06/20/14 0800  vancomycin (VANCOCIN) IVPB 750 mg/150 ml premix  Status:  Discontinued     750 mg 150 mL/hr over 60 Minutes Intravenous Every 12 hours 06/19/14 1636 06/20/14 1123   06/20/14 0800  ciprofloxacin (CIPRO) IVPB 400 mg  Status:  Discontinued     400 mg 200 mL/hr over 60 Minutes Intravenous Every 12 hours 06/20/14 0735 06/21/14 0925   06/20/14 0100  piperacillin-tazobactam (ZOSYN) IVPB 3.375 g  Status:  Discontinued     3.375 g 12.5 mL/hr over 240 Minutes Intravenous Every 8 hours 06/19/14 1820 06/21/14 1503   06/19/14 1830  piperacillin-tazobactam (ZOSYN) IVPB 3.375 g     3.375 g 100 mL/hr over 30 Minutes Intravenous  Once 06/19/14 1812 06/19/14 1922   06/19/14  1645  vancomycin (VANCOCIN) 2,000 mg in sodium chloride 0.9 % 500 mL IVPB     2,000 mg 250 mL/hr over 120 Minutes Intravenous  Once 06/19/14 1634 06/19/14 1913      Assessment/Plan: s/p Procedure(s): APPLICATION OF WOUND VAC (N/A) IRRIGATION AND DEBRIDEMENT WOUND (N/A) APPLICATION OF A-CELL OF HEAD/NECK (N/A) Discharge per primary team when home health and home VAC arranged.  Will need once a week VAC check but do not change unless needed or leak.  VAC at 100 mmHg continuous pressure (can increase to 125 mmHg if needed).  LOS: 16 days    [facility] HospitalANGER,CLAIRE 07/05/2014

## 2014-08-02 NOTE — Anesthesia Postprocedure Evaluation (Signed)
  Anesthesia Post-op Note  Patient: Eric CraneJon Treto  Procedure(s) Performed: Procedure(s): IRRIGATION AND DEBRIDEMENT OF POSTERIOR NECK WOUND WITH SURGICAL PREP AND PLACEMENT OF A CELL AND VAC (N/A) APPLICATION OF A-CELL OF HEAD/NECK (N/A)  Patient Location: PACU  Anesthesia Type: General   Level of Consciousness: awake, alert  and oriented  Airway and Oxygen Therapy: Patient Spontanous Breathing  Post-op Pain: none  Post-op Assessment: Post-op Vital signs reviewed  Post-op Vital Signs: Reviewed  Last Vitals:  Filed Vitals:   08/02/14 1115  BP: 120/68  Pulse: 97  Temp:   Resp: 21    Complications: No apparent anesthesia complications

## 2014-08-03 ENCOUNTER — Encounter (HOSPITAL_BASED_OUTPATIENT_CLINIC_OR_DEPARTMENT_OTHER): Payer: Self-pay | Admitting: Plastic Surgery

## 2014-08-03 NOTE — Addendum Note (Signed)
Addendum  created 08/03/14 [id number] by Ronnette HilaLinda D Anala Whisenant, CRNA   Modules edited: Anesthesia Blocks and Procedures, Clinical Notes   Clinical Notes:  File: [id number]

## 2014-08-03 NOTE — Anesthesia Procedure Notes (Signed)
Procedure Name: Intubation Date/Time: 08/03/2014 9:23 AM Performed by: Zenia ResidesPAYNE, Bishoy Cupp D Pre-anesthesia Checklist: Patient identified, Emergency Drugs available, Suction available and Patient being monitored Patient Re-evaluated:Patient Re-evaluated prior to inductionOxygen Delivery Method: Circle System Utilized Preoxygenation: Pre-oxygenation with 100% oxygen Intubation Type: IV induction Ventilation: Mask ventilation without difficulty Laryngoscope Size: Mac and 3 Grade View: Grade II Tube type: Oral Tube size: 7.0 mm Number of attempts: 1 Airway Equipment and Method: Stylet and Oral airway Placement Confirmation: ETT inserted through vocal cords under direct vision,  positive ETCO2 and breath sounds checked- equal and bilateral Secured at: 23 cm Tube secured with: Tape Dental Injury: Teeth and Oropharynx as per pre-operative assessment

## 2014-08-05 ENCOUNTER — Emergency Department (HOSPITAL_COMMUNITY)
Admission: EM | Admit: 2014-08-05 | Discharge: 2014-08-05 | Disposition: A | Discharge status: Home / Self Care | Payer: Self-pay | Attending: Emergency Medicine | Admitting: Emergency Medicine

## 2014-08-05 ENCOUNTER — Encounter (HOSPITAL_COMMUNITY): Payer: Self-pay | Admitting: *Deleted

## 2014-08-05 DIAGNOSIS — Z794 Long term (current) use of insulin: Secondary | ICD-10-CM | POA: Insufficient documentation

## 2014-08-05 DIAGNOSIS — Z8739 Personal history of other diseases of the musculoskeletal system and connective tissue: Secondary | ICD-10-CM | POA: Insufficient documentation

## 2014-08-05 DIAGNOSIS — I10 Essential (primary) hypertension: Secondary | ICD-10-CM | POA: Insufficient documentation

## 2014-08-05 DIAGNOSIS — Z8669 Personal history of other diseases of the nervous system and sense organs: Secondary | ICD-10-CM | POA: Insufficient documentation

## 2014-08-05 DIAGNOSIS — Z720 Tobacco use: Secondary | ICD-10-CM | POA: Insufficient documentation

## 2014-08-05 DIAGNOSIS — Z79899 Other long term (current) drug therapy: Secondary | ICD-10-CM | POA: Insufficient documentation

## 2014-08-05 DIAGNOSIS — E119 Type 2 diabetes mellitus without complications: Secondary | ICD-10-CM | POA: Insufficient documentation

## 2014-08-05 DIAGNOSIS — Z4801 Encounter for change or removal of surgical wound dressing: Secondary | ICD-10-CM | POA: Insufficient documentation

## 2014-08-05 DIAGNOSIS — F329 Major depressive disorder, single episode, unspecified: Secondary | ICD-10-CM | POA: Insufficient documentation

## 2014-08-05 DIAGNOSIS — Z4800 Encounter for change or removal of nonsurgical wound dressing: Secondary | ICD-10-CM

## 2014-08-05 NOTE — ED Notes (Signed)
The pt had his neck wound debrided on Wednesday past and a portion of his bandage came off this am  And the hime health nurse did not have the required bandage so he was sent here.  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

## 2014-08-05 NOTE — ED Notes (Signed)
Wound nurse at bedside.

## 2014-08-05 NOTE — ED Provider Notes (Signed)
CSN: [id number]     Arrival date & time 08/05/14  1714 History   First MD Initiated Contact with Patient 08/05/14 1853     Chief Complaint  Patient presents with  . Wound Check     (Consider location/radiation/quality/duration/timing/severity/associated sxs/prior Treatment) HPI Pt is a 54yo male s/p wound debridement from cellulitis on the back of his neck and upper back on Wednesday, 3/30 by Dr. Constance Holster and Dr.Claire Sanger.  This morning a portion of his bandage came off and his home health nurse did not have the required bandage. His home health nurse attempted to call on-call provider for Dr. Migdalia Dk but no answer so pt was sent to ED for dressing change.  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  Pt states pain is minimal, 3/10 at this time. Denies fever, chills, n/v/d.   Past Medical History  Diagnosis Date  . Plantar fasciitis, bilateral   . Hypertension   . High cholesterol   . Type II diabetes mellitus   . Arthritis     "all 10 toes" (06/19/2014)  . Depression   . Sleep apnea     "not suppose to wear mask" (06/19/2014)-does not have a cpap-   Past Surgical History  Procedure Laterality Date  . Reduction of torsion of testis Left 1979  . Incision and drainage abscess N/A 06/24/2014    Procedure: INCISION AND DRAINAGE OF POSTERIOR NECK ABCESS;  Surgeon: Izora Gala, MD;  Location: [place];  Service: ENT;  Laterality: N/A;  . Wound debridement N/A 06/24/2014    Procedure: DEBRIDEMENT OF NECROTIZING FASCITIS ON POSTERIOR NECK;  Surgeon: Izora Gala, MD;  Location: [place];  Service: ENT;  Laterality: N/A;  . Incision and drainage abscess N/A 06/26/2014    Procedure: REPEAT DEBRIEDMENT OF NECK  WOUND;  Surgeon: Izora Gala, MD;  Location: [place];  Service: ENT;  Laterality: N/A;  . Incision and drainage abscess N/A 06/28/2014    Procedure:  REPEAT DEBRIEDMENT OF NECK  WOUND;  Surgeon: Izora Gala, MD;  Location: [place];  Service: ENT;  Laterality: N/A;  . Application of wound vac N/A 07/05/2014     Procedure: APPLICATION OF WOUND VAC;  Surgeon: Izora Gala, MD;  Location: [place];  Service: ENT;  Laterality: N/A;  . Incision and drainage of wound N/A 07/05/2014    Procedure: IRRIGATION AND DEBRIDEMENT WOUND;  Surgeon: Izora Gala, MD;  Location: [place];  Service: ENT;  Laterality: N/A;  . Application of a-cell of head/neck N/A 07/05/2014    Procedure: APPLICATION OF A-CELL OF HEAD/NECK;  Surgeon: Izora Gala, MD;  Location: [place];  Service: ENT;  Laterality: N/A;  . Incision and drainage of wound N/A 07/20/2014    Procedure: IRRIGATION AND DEBRIDEMENT OF NECROTIZING FASCIITIS WOUND OF NECK WITH PLACEMENT OF ACELL/VAC;  Surgeon: Theodoro Kos, DO;  Location: [place];  Service: Plastics;  Laterality: N/A;  . Application of a-cell of head/neck N/A 07/20/2014    Procedure: APPLICATION OF A-CELL OF HEAD/NECK;  Surgeon: Theodoro Kos, DO;  Location: [place];  Service: Plastics;  Laterality: N/A;  . Incision and drainage of wound N/A 08/02/2014    Procedure: IRRIGATION AND DEBRIDEMENT OF POSTERIOR NECK WOUND WITH SURGICAL PREP AND PLACEMENT OF A CELL AND VAC;  Surgeon: Theodoro Kos, DO;  Location: [place];  Service: Plastics;  Laterality: N/A;  . Application of a-cell of head/neck N/A 08/02/2014    Procedure: APPLICATION OF A-CELL OF HEAD/NECK;  Surgeon: Theodoro Kos, DO;  Location: [place];  Service: Plastics;  Laterality: N/A;   Family History  Problem Relation Age of Onset  . Family history unknown: Yes   History  Substance Use Topics  . Smoking status: Current Every Day Smoker -- 0.50 packs/day for 36 years    Types: Cigarettes  . Smokeless tobacco: Never Used  . Alcohol Use: 1.2 oz/week    2 Cans of beer per week     Comment: Occasionally; has not had any etoh in 8 months    Review of Systems  Constitutional: Negative for fever and chills.  Skin: Positive for wound. Negative for color change.  All other systems reviewed  and are negative.     Allergies  Review of patient's allergies indicates no known allergies.  Home Medications   Prior to Admission medications   Medication Sig Start Date End Date Taking? Authorizing Provider  ARIPiprazole (ABILIFY) 5 MG tablet Take 5 mg by mouth every morning.   Yes Historical Provider, MD  atenolol (TENORMIN) 50 MG tablet Take 50 mg by mouth daily.   Yes Historical Provider, MD  DULoxetine (CYMBALTA) 30 MG capsule Take 90 mg by mouth every morning.    Yes Historical Provider, MD  gabapentin (NEURONTIN) 300 MG capsule Take 1,200 mg by mouth 2 (two) times daily.    Yes Historical Provider, MD  insulin aspart (NOVOLOG) 100 UNIT/ML injection Inject 3-20 Units into the skin 3 (three) times daily before meals. Per sliding scale. 07/14/14  Yes Micheline Chapman, NP  insulin detemir (LEVEMIR) 100 UNIT/ML injection Inject 0.5 mLs (50 Units total) into the skin 2 (two) times daily. 07/14/14  Yes Micheline Chapman, NP  lisinopril (PRINIVIL,ZESTRIL) 10 MG tablet Take 1 tablet (10 mg total) by mouth daily. For hypertension and renal protection. 12/31/12  Yes Ruben Im, PA-C  oxyCODONE-acetaminophen (PERCOCET/ROXICET) 5-325 MG per tablet Take 1-2 tablets by mouth every 4 (four) hours as needed for moderate pain or severe pain. 07/06/14  Yes Hosie Poisson, MD  Blood Glucose Monitoring Suppl (MEIJER TRUERESULT GLUCOSE SYS) W/DEVICE KIT Check sugars 4 times a day 07/14/14   Micheline Chapman, NP  feeding supplement, GLUCERNA SHAKE, (GLUCERNA SHAKE) LIQD Take 237 mLs by mouth 2 (two) times daily between meals. 07/06/14   Hosie Poisson, MD  glucose blood (AGAMATRIX PRESTO TEST) test strip Use as instructed 07/14/14   Micheline Chapman, NP  Lancets (ACCU-CHEK MULTICLIX) lancets Use as instructed 07/14/14   Micheline Chapman, NP   BP 148/85 mmHg  Pulse 101  Temp(Src) 98.2 F (36.8 C)  Resp 18  Ht _0  (1.778 m)  Wt 220 lb (99.791 kg)  BMI 31.57 kg/m2  SpO2 96% Physical Exam   Constitutional: He is oriented to person, place, and time. He appears well-developed and well-nourished.  HENT:  Head: Normocephalic and atraumatic.  Eyes: EOM are normal.  Neck: Normal range of motion.  Cardiovascular: Normal rate.   Pulmonary/Chest: Effort normal.  Musculoskeletal: Normal range of motion.  Neurological: He is alert and oriented to person, place, and time.  Skin: Skin is warm and dry.  Extensive open wound with clear bandage from wound vac in place. Wound extending from posterior aspect base of scalp across bilateral shoulder and middle of back. Wound is 6.5x10cm   Psychiatric: He has a normal mood and affect. His behavior is normal.  Nursing note and vitals reviewed.   ED Course  Procedures (including critical care time) Labs Review Labs Reviewed - No data  to display  Imaging Review No results found.   EKG Interpretation None      MDM   Final diagnoses:  Encounter for change or removal of wound dressing    Pt is a 54yo male presenting to ED for wound vac repair or change as home health nurse did not have appropriate supplies for bandage change and a small piece fell off.  No other concerns or complaints.   8:17 PM Consulted with Dr. Theodoro Kos, stated she does not want the wound vac changed but requested it should be re-enforced.  Recommended calling an RN from [place] come assist with dressing.   9:01 PM Consulted with a nurse from [place] who agreed to come down to assist with fixing pt's wound vac.    Dr. Migdalia Dk was able to be reached to talk RN through bandage change. Pt safe for discharge home to f/u with Dr. Migdalia Dk within 1-2 days for recheck of bandage.    Noland Fordyce, PA-C 08/05/14 [place], MD 08/06/14 [phone]

## 2014-08-05 NOTE — ED Notes (Signed)
Dr. Ethelda ChickJacubowitz at bedside speaking with pt about wound vac.

## 2014-08-05 NOTE — ED Provider Notes (Signed)
Patient reports that his bandage on neck wound cameun done. Presently asymptomatic. Home health nurse was unable to provide proper wound care. Patient is alert ambulatory alert no distress. Dr.Sanger notified and will guide our nurse on how to re-bandage  Eric SouSam Jahnyla Parrillo, MD 08/05/14 2200

## 2014-08-05 NOTE — ED Notes (Signed)
Pt ambulating in hallway, requesting to leave b/c he does not want to wait for wound nurse to come examine wound vac. Discussed reasons for delay in care to pt. Pt waiting in room to be seen by wound nurse.

## 2014-08-07 ENCOUNTER — Ambulatory Visit: Payer: MEDICAID | Attending: Internal Medicine

## 2014-08-08 ENCOUNTER — Encounter (HOSPITAL_BASED_OUTPATIENT_CLINIC_OR_DEPARTMENT_OTHER): Payer: Self-pay | Admitting: *Deleted

## 2014-08-09 ENCOUNTER — Encounter (HOSPITAL_BASED_OUTPATIENT_CLINIC_OR_DEPARTMENT_OTHER): Payer: Self-pay | Admitting: *Deleted

## 2014-08-10 ENCOUNTER — Encounter (HOSPITAL_BASED_OUTPATIENT_CLINIC_OR_DEPARTMENT_OTHER): Payer: Self-pay | Admitting: Certified Registered"

## 2014-08-10 ENCOUNTER — Ambulatory Visit (HOSPITAL_BASED_OUTPATIENT_CLINIC_OR_DEPARTMENT_OTHER): Payer: MEDICAID | Admitting: Certified Registered"

## 2014-08-10 ENCOUNTER — Encounter (HOSPITAL_BASED_OUTPATIENT_CLINIC_OR_DEPARTMENT_OTHER)
Admission: RE | Disposition: A | Discharge status: Home / Self Care | Payer: Self-pay | Source: Ambulatory Visit | Attending: Plastic Surgery

## 2014-08-10 ENCOUNTER — Other Ambulatory Visit: Payer: Self-pay | Admitting: Plastic Surgery

## 2014-08-10 ENCOUNTER — Ambulatory Visit (HOSPITAL_BASED_OUTPATIENT_CLINIC_OR_DEPARTMENT_OTHER): Payer: Self-pay | Admitting: Certified Registered"

## 2014-08-10 ENCOUNTER — Ambulatory Visit (HOSPITAL_BASED_OUTPATIENT_CLINIC_OR_DEPARTMENT_OTHER)
Admission: RE | Admit: 2014-08-10 | Discharge: 2014-08-10 | Disposition: A | Discharge status: Home / Self Care | Payer: Self-pay | Source: Ambulatory Visit | Attending: Plastic Surgery | Admitting: Plastic Surgery

## 2014-08-10 DIAGNOSIS — E119 Type 2 diabetes mellitus without complications: Secondary | ICD-10-CM | POA: Insufficient documentation

## 2014-08-10 DIAGNOSIS — L98493 Non-pressure chronic ulcer of skin of other sites with necrosis of muscle: Secondary | ICD-10-CM

## 2014-08-10 DIAGNOSIS — Z794 Long term (current) use of insulin: Secondary | ICD-10-CM | POA: Insufficient documentation

## 2014-08-10 DIAGNOSIS — M1990 Unspecified osteoarthritis, unspecified site: Secondary | ICD-10-CM | POA: Insufficient documentation

## 2014-08-10 DIAGNOSIS — I10 Essential (primary) hypertension: Secondary | ICD-10-CM | POA: Insufficient documentation

## 2014-08-10 DIAGNOSIS — F17210 Nicotine dependence, cigarettes, uncomplicated: Secondary | ICD-10-CM | POA: Insufficient documentation

## 2014-08-10 DIAGNOSIS — L98499 Non-pressure chronic ulcer of skin of other sites with unspecified severity: Secondary | ICD-10-CM | POA: Insufficient documentation

## 2014-08-10 DIAGNOSIS — E780 Pure hypercholesterolemia: Secondary | ICD-10-CM | POA: Insufficient documentation

## 2014-08-10 DIAGNOSIS — F1099 Alcohol use, unspecified with unspecified alcohol-induced disorder: Secondary | ICD-10-CM | POA: Insufficient documentation

## 2014-08-10 DIAGNOSIS — F329 Major depressive disorder, single episode, unspecified: Secondary | ICD-10-CM | POA: Insufficient documentation

## 2014-08-10 HISTORY — PX: INCISION AND DRAINAGE OF WOUND: SHX1803

## 2014-08-10 HISTORY — DX: Necrotizing fasciitis: M72.6

## 2014-08-10 HISTORY — PX: SKIN SPLIT GRAFT: SHX444

## 2014-08-10 LAB — POCT I-STAT, CHEM 8
BUN: 13 mg/dL (ref 6–23)
CALCIUM ION: 1.18 mmol/L (ref 1.12–1.23)
CREATININE: 0.7 mg/dL (ref 0.50–1.35)
Chloride: 102 mmol/L (ref 96–112)
Glucose, Bld: 363 mg/dL — ABNORMAL HIGH (ref 70–99)
HCT: 47 % (ref 39.0–52.0)
Hemoglobin: 16 g/dL (ref 13.0–17.0)
Potassium: 3.9 mmol/L (ref 3.5–5.1)
Sodium: 139 mmol/L (ref 135–145)
TCO2: 22 mmol/L (ref 0–100)

## 2014-08-10 LAB — GLUCOSE, CAPILLARY: Glucose-Capillary: 265 mg/dL — ABNORMAL HIGH (ref 70–99)

## 2014-08-10 SURGERY — IRRIGATION AND DEBRIDEMENT WOUND
Anesthesia: General | Site: Neck

## 2014-08-10 MED ORDER — FENTANYL CITRATE 0.05 MG/ML IJ SOLN
INTRAMUSCULAR | Status: AC
  Filled 2014-08-10: qty 4

## 2014-08-10 MED ORDER — FENTANYL CITRATE 0.05 MG/ML IJ SOLN
INTRAMUSCULAR | Status: DC | PRN
  Administered 2014-08-10 (×2): 50 ug via INTRAVENOUS

## 2014-08-10 MED ORDER — SODIUM CHLORIDE 0.9 % IR SOLN
Status: DC | PRN
  Administered 2014-08-10: 500 mL

## 2014-08-10 MED ORDER — OXYCODONE HCL 5 MG/5ML PO SOLN: 5.0000 mg | Freq: Once | ORAL | Status: DC | PRN

## 2014-08-10 MED ORDER — INSULIN ASPART 100 UNIT/ML ~~LOC~~ SOLN
10.0000 [IU] | Freq: Once | SUBCUTANEOUS | Status: AC
  Administered 2014-08-10: 10 [IU] via SUBCUTANEOUS

## 2014-08-10 MED ORDER — PROPOFOL 10 MG/ML IV BOLUS
INTRAVENOUS | Status: DC | PRN
  Administered 2014-08-10: 140 mg via INTRAVENOUS

## 2014-08-10 MED ORDER — HYDROMORPHONE HCL 1 MG/ML IJ SOLN: 0.2500 mg | INTRAMUSCULAR | Status: DC | PRN

## 2014-08-10 MED ORDER — ONDANSETRON HCL 4 MG/2ML IJ SOLN
INTRAMUSCULAR | Status: DC | PRN
  Administered 2014-08-10: 4 mg via INTRAVENOUS

## 2014-08-10 MED ORDER — LACTATED RINGERS IV SOLN
INTRAVENOUS | Status: DC
  Administered 2014-08-10 (×2): via INTRAVENOUS

## 2014-08-10 MED ORDER — SUCCINYLCHOLINE CHLORIDE 20 MG/ML IJ SOLN
INTRAMUSCULAR | Status: DC | PRN
  Administered 2014-08-10: 120 mg via INTRAVENOUS

## 2014-08-10 MED ORDER — CEFAZOLIN SODIUM-DEXTROSE 2-3 GM-% IV SOLR
INTRAVENOUS | Status: AC
  Filled 2014-08-10: qty 50

## 2014-08-10 MED ORDER — OXYCODONE HCL 5 MG PO TABS: 5.0000 mg | ORAL_TABLET | Freq: Once | ORAL | Status: DC | PRN

## 2014-08-10 MED ORDER — PROMETHAZINE HCL 25 MG/ML IJ SOLN: 6.2500 mg | INTRAMUSCULAR | Status: DC | PRN

## 2014-08-10 MED ORDER — EPHEDRINE SULFATE 50 MG/ML IJ SOLN
INTRAMUSCULAR | Status: DC | PRN
  Administered 2014-08-10 (×3): 5 mg via INTRAVENOUS

## 2014-08-10 MED ORDER — BUPIVACAINE-EPINEPHRINE 0.25% -1:200000 IJ SOLN
INTRAMUSCULAR | Status: DC | PRN
  Administered 2014-08-10: 10 mL

## 2014-08-10 MED ORDER — MIDAZOLAM HCL 2 MG/2ML IJ SOLN: 1.0000 mg | INTRAMUSCULAR | Status: DC | PRN

## 2014-08-10 MED ORDER — MIDAZOLAM HCL 5 MG/5ML IJ SOLN
INTRAMUSCULAR | Status: DC | PRN
  Administered 2014-08-10: 2 mg via INTRAVENOUS

## 2014-08-10 MED ORDER — LIDOCAINE-EPINEPHRINE 1 %-1:100000 IJ SOLN
INTRAMUSCULAR | Status: AC
  Filled 2014-08-10: qty 1

## 2014-08-10 MED ORDER — FENTANYL CITRATE 0.05 MG/ML IJ SOLN: 25.0000 ug | INTRAMUSCULAR | Status: DC | PRN

## 2014-08-10 MED ORDER — INSULIN ASPART 100 UNIT/ML ~~LOC~~ SOLN
SUBCUTANEOUS | Status: AC
  Filled 2014-08-10: qty 1

## 2014-08-10 MED ORDER — EVICEL 2 ML EX KIT
PACK | CUTANEOUS | Status: DC | PRN
  Administered 2014-08-10: 2 mL

## 2014-08-10 MED ORDER — ARTIFICIAL TEARS OP OINT
TOPICAL_OINTMENT | OPHTHALMIC | Status: DC | PRN
  Administered 2014-08-10: 1 via OPHTHALMIC

## 2014-08-10 MED ORDER — BUPIVACAINE-EPINEPHRINE (PF) 0.25% -1:200000 IJ SOLN
INTRAMUSCULAR | Status: AC
  Filled 2014-08-10: qty 30

## 2014-08-10 MED ORDER — MIDAZOLAM HCL 2 MG/2ML IJ SOLN
INTRAMUSCULAR | Status: AC
  Filled 2014-08-10: qty 2

## 2014-08-10 MED ORDER — CEFAZOLIN SODIUM-DEXTROSE 2-3 GM-% IV SOLR
2.0000 g | INTRAVENOUS | Status: AC
  Administered 2014-08-10: 2 g via INTRAVENOUS

## 2014-08-10 MED ORDER — LIDOCAINE HCL 4 % MT SOLN
OROMUCOSAL | Status: DC | PRN
  Administered 2014-08-10: 2 mL via TOPICAL

## 2014-08-10 MED ORDER — LIDOCAINE HCL (CARDIAC) 20 MG/ML IV SOLN
INTRAVENOUS | Status: DC | PRN
  Administered 2014-08-10: 40 mg via INTRAVENOUS

## 2014-08-10 MED ORDER — ONDANSETRON HCL 4 MG/2ML IJ SOLN: 4.0000 mg | Freq: Once | INTRAMUSCULAR | Status: DC | PRN

## 2014-08-10 MED ORDER — BACITRACIN ZINC 500 UNIT/GM EX OINT
TOPICAL_OINTMENT | CUTANEOUS | Status: AC
  Filled 2014-08-10: qty 28.35

## 2014-08-10 MED ORDER — FENTANYL CITRATE 0.05 MG/ML IJ SOLN: 50.0000 ug | INTRAMUSCULAR | Status: DC | PRN

## 2014-08-10 SURGICAL SUPPLY — 88 items
BAG DECANTER FOR FLEXI CONT (MISCELLANEOUS) ×3 IMPLANT
BANDAGE ELASTIC 4 VELCRO ST LF (GAUZE/BANDAGES/DRESSINGS) ×3 IMPLANT
BANDAGE ELASTIC 6 VELCRO ST LF (GAUZE/BANDAGES/DRESSINGS) IMPLANT
BENZOIN TINCTURE PRP APPL 2/3 (GAUZE/BANDAGES/DRESSINGS) ×3 IMPLANT
BLADE CLIPPER SURG (BLADE) IMPLANT
BLADE DERMATOME SS (BLADE) ×3 IMPLANT
BLADE HEX COATED 2.75 (ELECTRODE) IMPLANT
BLADE SURG 10 STRL SS (BLADE) IMPLANT
BLADE SURG 15 STRL LF DISP TIS (BLADE) ×1 IMPLANT
BLADE SURG 15 STRL SS (BLADE) ×2
BNDG GAUZE ELAST 4 BULKY (GAUZE/BANDAGES/DRESSINGS) ×3 IMPLANT
CANISTER SUCT 1200ML W/VALVE (MISCELLANEOUS) IMPLANT
CHLORAPREP W/TINT 26ML (MISCELLANEOUS) IMPLANT
CLOSURE WOUND 1/2 X4 (GAUZE/BANDAGES/DRESSINGS)
COTTONBALL LRG STERILE PKG (GAUZE/BANDAGES/DRESSINGS) IMPLANT
COVER BACK TABLE 60X90IN (DRAPES) ×3 IMPLANT
COVER MAYO STAND STRL (DRAPES) ×3 IMPLANT
DECANTER SPIKE VIAL GLASS SM (MISCELLANEOUS) IMPLANT
DERMACARRIERS GRAFT 1 TO 1.5 (DISPOSABLE)
DRAIN CHANNEL 19F RND (DRAIN) IMPLANT
DRAIN PENROSE 1/2X12 LTX STRL (WOUND CARE) IMPLANT
DRAPE INCISE IOBAN 66X45 STRL (DRAPES) ×3 IMPLANT
DRAPE LAPAROSCOPIC ABDOMINAL (DRAPES) IMPLANT
DRAPE LAPAROTOMY 100X72 PEDS (DRAPES) IMPLANT
DRAPE U-SHAPE 76X120 STRL (DRAPES) ×3 IMPLANT
DRSG ADAPTIC 3X8 NADH LF (GAUZE/BANDAGES/DRESSINGS) ×6 IMPLANT
DRSG EMULSION OIL 3X3 NADH (GAUZE/BANDAGES/DRESSINGS) IMPLANT
DRSG OPSITE 6X11 MED (GAUZE/BANDAGES/DRESSINGS) IMPLANT
DRSG PAD ABDOMINAL 8X10 ST (GAUZE/BANDAGES/DRESSINGS) ×3 IMPLANT
DRSG TEGADERM 4X10 (GAUZE/BANDAGES/DRESSINGS) IMPLANT
DURAPREP 26ML APPLICATOR (WOUND CARE) ×3 IMPLANT
ELECT REM PT RETURN 9FT ADLT (ELECTROSURGICAL) ×3
ELECTRODE REM PT RTRN 9FT ADLT (ELECTROSURGICAL) ×1 IMPLANT
EVACUATOR SILICONE 100CC (DRAIN) IMPLANT
GAUZE SPONGE 4X4 12PLY STRL (GAUZE/BANDAGES/DRESSINGS) ×3 IMPLANT
GAUZE XEROFORM 1X8 LF (GAUZE/BANDAGES/DRESSINGS) IMPLANT
GAUZE XEROFORM 5X9 LF (GAUZE/BANDAGES/DRESSINGS) IMPLANT
GLOVE BIO SURGEON STRL SZ 6.5 (GLOVE) ×6 IMPLANT
GLOVE BIO SURGEON STRL SZ7 (GLOVE) ×3 IMPLANT
GLOVE BIO SURGEONS STRL SZ 6.5 (GLOVE) ×3
GLOVE BIOGEL PI IND STRL 7.5 (GLOVE) ×1 IMPLANT
GLOVE BIOGEL PI INDICATOR 7.5 (GLOVE) ×2
GLOVE SURG SS PI 7.0 STRL IVOR (GLOVE) ×3 IMPLANT
GOWN STRL REUS W/ TWL LRG LVL3 (GOWN DISPOSABLE) ×4 IMPLANT
GOWN STRL REUS W/TWL LRG LVL3 (GOWN DISPOSABLE) ×8
GRAFT DERMACARRIERS 1 TO 1.5 (DISPOSABLE) IMPLANT
IV NS IRRIG 3000ML ARTHROMATIC (IV SOLUTION) IMPLANT
LIQUID BAND (GAUZE/BANDAGES/DRESSINGS) ×3 IMPLANT
MANIFOLD NEPTUNE II (INSTRUMENTS) IMPLANT
MATRIX SURGICAL PSM 7X10CM (Tissue) ×3 IMPLANT
NEEDLE HYPO 25X1 1.5 SAFETY (NEEDLE) ×3 IMPLANT
NS IRRIG 1000ML POUR BTL (IV SOLUTION) ×3 IMPLANT
PACK BASIN DAY SURGERY FS (CUSTOM PROCEDURE TRAY) ×3 IMPLANT
PENCIL BUTTON HOLSTER BLD 10FT (ELECTRODE) ×3 IMPLANT
PIN SAFETY STERILE (MISCELLANEOUS) IMPLANT
SHEET MEDIUM DRAPE 40X70 STRL (DRAPES) ×9 IMPLANT
SLEEVE SCD COMPRESS KNEE MED (MISCELLANEOUS) IMPLANT
SPLINT FIBERGLASS 3X35 (CAST SUPPLIES) IMPLANT
SPLINT FIBERGLASS 4X30 (CAST SUPPLIES) IMPLANT
SPONGE GAUZE 4X4 12PLY STER LF (GAUZE/BANDAGES/DRESSINGS) IMPLANT
SPONGE LAP 18X18 X RAY DECT (DISPOSABLE) ×3 IMPLANT
STAPLER VISISTAT 35W (STAPLE) IMPLANT
STRIP CLOSURE SKIN 1/2X4 (GAUZE/BANDAGES/DRESSINGS) IMPLANT
SUCTION FRAZIER TIP 10 FR DISP (SUCTIONS) IMPLANT
SURGILUBE 2OZ TUBE FLIPTOP (MISCELLANEOUS) ×3 IMPLANT
SUT MNCRL AB 4-0 PS2 18 (SUTURE) IMPLANT
SUT MON AB 3-0 SH 27 (SUTURE)
SUT MON AB 3-0 SH27 (SUTURE) IMPLANT
SUT MON AB 5-0 PS2 18 (SUTURE) IMPLANT
SUT SILK 3 0 PS 1 (SUTURE) IMPLANT
SUT SILK 3 0 SH CR/8 (SUTURE) IMPLANT
SUT SILK 4 0 SH CR/8 (SUTURE) IMPLANT
SUT VIC AB 3-0 FS2 27 (SUTURE) IMPLANT
SUT VIC AB 5-0 PS2 18 (SUTURE) ×9 IMPLANT
SUT VICRYL 4-0 PS2 18IN ABS (SUTURE) IMPLANT
SWAB COLLECTION DEVICE MRSA (MISCELLANEOUS) IMPLANT
SYR BULB IRRIGATION 50ML (SYRINGE) ×3 IMPLANT
SYR CONTROL 10ML LL (SYRINGE) ×3 IMPLANT
TAPE HYPAFIX 6 X30' (GAUZE/BANDAGES/DRESSINGS)
TAPE HYPAFIX 6X30 (GAUZE/BANDAGES/DRESSINGS) IMPLANT
TIP RIGID 35CM EVICEL (HEMOSTASIS) ×3 IMPLANT
TOWEL OR 17X24 6PK STRL BLUE (TOWEL DISPOSABLE) ×9 IMPLANT
TRAY DSU PREP LF (CUSTOM PROCEDURE TRAY) ×3 IMPLANT
TUBE ANAEROBIC SPECIMEN COL (MISCELLANEOUS) IMPLANT
TUBE CONNECTING 20'X1/4 (TUBING) ×1
TUBE CONNECTING 20X1/4 (TUBING) ×2 IMPLANT
UNDERPAD 30X30 INCONTINENT (UNDERPADS AND DIAPERS) ×3 IMPLANT
YANKAUER SUCT BULB TIP NO VENT (SUCTIONS) ×3 IMPLANT

## 2014-08-10 NOTE — Anesthesia Preprocedure Evaluation (Addendum)
Anesthesia Evaluation  Patient identified by MRN, date of birth, ID band Patient awake    Reviewed: Allergy & Precautions, H&P , NPO status , Patient's Chart, lab work & pertinent test results, reviewed documented beta blocker date and time   Airway Mallampati: II  TM Distance: >3 FB Neck ROM: Full    Dental no notable dental hx. (+) Teeth Intact, Dental Advisory Given   Pulmonary sleep apnea , Current Smoker,  breath sounds clear to auscultation  Pulmonary exam normal       Cardiovascular hypertension, Pt. on medications and Pt. on home beta blockers Rhythm:Regular Rate:Normal     Neuro/Psych Depression negative neurological ROS     GI/Hepatic negative GI ROS, Neg liver ROS,   Endo/Other  diabetes, Insulin Dependent  Renal/GU negative Renal ROS  negative genitourinary   Musculoskeletal  (+) Arthritis -, Osteoarthritis,    Abdominal   Peds  Hematology negative hematology ROS (+)   Anesthesia Other Findings   Reproductive/Obstetrics negative OB ROS                            Anesthesia Physical Anesthesia Plan  ASA: III  Anesthesia Plan: General   Post-op Pain Management:    Induction: Intravenous  Airway Management Planned: Oral ETT  Additional Equipment:   Intra-op Plan:   Post-operative Plan: Extubation in OR  Informed Consent: I have reviewed the patients History and Physical, chart, labs and discussed the procedure including the risks, benefits and alternatives for the proposed anesthesia with the patient or authorized representative who has indicated his/her understanding and acceptance.   Dental advisory given  Plan Discussed with: CRNA  Anesthesia Plan Comments:         Anesthesia Quick Evaluation

## 2014-08-10 NOTE — Op Note (Signed)
Operative Note   DATE OF OPERATION: 08/10/2014  [facility]  SURGICAL DIVISION: Plastic Surgery  PREOPERATIVE DIAGNOSES:  Neck wound (6 x 10 cm)  POSTOPERATIVE DIAGNOSES:  same  PROCEDURE:  Split thickness skin graft and VAC placement to the neck wound (6 x 10 cm), placement of Acell (7 x 10 cm sheet) to the donor site   SURGEON: Dr. Pollyann Kennedyosen  ASSISTANT: Alan Ripperlaire Sanger, DO  ANESTHESIA:  General.   COMPLICATIONS: None.   INDICATIONS FOR PROCEDURE:  The patient, Eric Shaw is a 54 y.o. male born on 04-27-1961, is here for treatment of a large neck ulcer from infection.  He was debrided several times with placement of Acell and the VAC. MRN: [id number]  CONSENT:  Informed consent was obtained directly from the patient. Risks, benefits and alternatives were fully discussed. Specific risks including but not limited to bleeding, infection, hematoma, seroma, scarring, pain, infection, contracture, asymmetry, wound healing problems, and need for further surgery were all discussed. The patient did have an ample opportunity to have questions answered to satisfaction.   DESCRIPTION OF PROCEDURE:  The patient was taken to the operating room. SCDs were placed and IV antibiotics were given. The patient's operative site was prepped and draped in a sterile fashion. A time out was performed and all information was confirmed to be correct.  General anesthesia was administered.  The area was irrigated with normal saline and antibiotic solution (6 x 10 cm).   The right thigh subcutaneous area was injected with marcaine for intraoperative hemostasis and postoperative pain management.  The dermatome was set at 14/1000 inch and the 3 inch guard was utilized.  The graft was then placed on the recipient site.  The edges were sutured with 5-0 Vicryl.  The Evicel was injected between the wound and the graft site for better adhesion.  The adaptic was applied over the graft followed  by the VAC.  There was an excellent seal obtained.  The Acell sheet was prepared and placed over the donor site with the complete sheet (7 x 10 cm) utilized.  Adaptic was applied and surgical lube.  The leg was dressed with an ABD.The patient tolerated the procedure well.  There were no complications. The patient was allowed to wake from anesthesia, extubated and taken to the recovery room in satisfactory condition.

## 2014-08-10 NOTE — Anesthesia Procedure Notes (Signed)
Procedure Name: Intubation Performed by: Curly ShoresRAFT, Aleaya Latona W Pre-anesthesia Checklist: Patient identified, Emergency Drugs available, Suction available and Patient being monitored Patient Re-evaluated:Patient Re-evaluated prior to inductionOxygen Delivery Method: Circle System Utilized Preoxygenation: Pre-oxygenation with 100% oxygen Intubation Type: IV induction Ventilation: Mask ventilation without difficulty Laryngoscope Size: Mac and 3 Grade View: Grade I Tube type: Oral Tube size: 8.0 mm Number of attempts: 1 Airway Equipment and Method: Stylet and Oral airway Placement Confirmation: ETT inserted through vocal cords under direct vision,  positive ETCO2 and breath sounds checked- equal and bilateral Secured at: 23 cm Tube secured with: Tape Dental Injury: Teeth and Oropharynx as per pre-operative assessment

## 2014-08-10 NOTE — Transfer of Care (Signed)
Immediate Anesthesia Transfer of Care Note  Patient: Eric CraneJon Nill  Procedure(s) Performed: Procedure(s) with comments: IRRIGATION AND DEBRIDEMENT OF POSTERIOR NECK WOUND WITH SURGICAL PREP WITH SPLIT THICKNESS SKIN GRAFT AND PLACEMENT OF VAC (N/A) SKIN GRAFT SPLIT THICKNESS (N/A) - From Left Posterior Thigh  Patient Location: PACU  Anesthesia Type:General  Level of Consciousness: awake, sedated and patient cooperative  Airway & Oxygen Therapy: Patient Spontanous Breathing and Patient connected to face mask oxygen  Post-op Assessment: Report given to RN, Post -op Vital signs reviewed and stable and Patient moving all extremities  Post vital signs: Reviewed and stable  Last Vitals:  Filed Vitals:   08/10/14 0726  BP: 164/85  Pulse: 92  Temp: 36.8 C  Resp: 18    Complications: No apparent anesthesia complications

## 2014-08-10 NOTE — Anesthesia Postprocedure Evaluation (Signed)
  Anesthesia Post-op Note  Patient: Eric Shaw  Procedure(s) Performed: Procedure(s) with comments: IRRIGATION AND DEBRIDEMENT OF POSTERIOR NECK WOUND WITH SURGICAL PREP WITH SPLIT THICKNESS SKIN GRAFT AND PLACEMENT OF VAC (N/A) SKIN GRAFT SPLIT THICKNESS (N/A) - From Left Posterior Thigh  Patient Location: PACU  Anesthesia Type:General  Level of Consciousness: awake and alert   Airway and Oxygen Therapy: Patient Spontanous Breathing  Post-op Pain: none  Post-op Assessment: Post-op Vital signs reviewed, Patient's Cardiovascular Status Stable and Respiratory Function Stable  Post-op Vital Signs: Reviewed  Filed Vitals:   08/10/14 1015  BP: 154/77  Pulse: 99  Temp:   Resp: 16    Complications: No apparent anesthesia complications

## 2014-08-10 NOTE — Brief Op Note (Signed)
08/10/2014  10:26 AM  PATIENT:  Eric Shaw  54 y.o. male  PRE-OPERATIVE DIAGNOSIS:  posterior neck wound  POST-OPERATIVE DIAGNOSIS:  posterior neck wound  PROCEDURE:  Procedure(s) with comments: IRRIGATION AND DEBRIDEMENT OF POSTERIOR NECK WOUND WITH SURGICAL PREP WITH SPLIT THICKNESS SKIN GRAFT AND PLACEMENT OF VAC (N/A) SKIN GRAFT SPLIT THICKNESS (N/A) - From Left Posterior Thigh  SURGEON:  Surgeon(s) and Role:    * Claire Sanger, DO - Primary  PHYSICIAN ASSISTANT: Shawn Rayburn, PA  ASSISTANTS: none   ANESTHESIA:   epidural  EBL:  Total I/O In: 1400 [I.V.:1400] Out: -   BLOOD ADMINISTERED:none  DRAINS: none   LOCAL MEDICATIONS USED:  MARCAINE     SPECIMEN:  No Specimen  DISPOSITION OF SPECIMEN:  N/A  COUNTS:  YES  TOURNIQUET:  * No tourniquets in log *  DICTATION: .Dragon Dictation  PLAN OF CARE: Discharge to home after PACU  PATIENT DISPOSITION:  PACU - hemodynamically stable.   Delay start of Pharmacological VTE agent (>24hrs) due to surgical blood loss or risk of bleeding: no

## 2014-08-10 NOTE — Discharge Instructions (Signed)
No shower Removed leg bandage on Fri KY jelly once a day  Post Anesthesia Home Care Instructions  Activity: Get plenty of rest for the remainder of the day. A responsible adult should stay with you for 24 hours following the procedure.  For the next 24 hours, DO NOT: -Drive a car -Advertising copywriterperate machinery -Drink alcoholic beverages -Take any medication unless instructed by your physician -Make any legal decisions or sign important papers.  Meals: Start with liquid foods such as gelatin or soup. Progress to regular foods as tolerated. Avoid greasy, spicy, heavy foods. If nausea and/or vomiting occur, drink only clear liquids until the nausea and/or vomiting subsides. Call your physician if vomiting continues.  Special Instructions/Symptoms: Your throat may feel dry or sore from the anesthesia or the breathing tube placed in your throat during surgery. If this causes discomfort, gargle with warm salt water. The discomfort should disappear within 24 hours.  If you had a scopolamine patch placed behind your ear for the management of post- operative nausea and/or vomiting:  1. The medication in the patch is effective for 72 hours, after which it should be removed.  Wrap patch in a tissue and discard in the trash. Wash hands thoroughly with soap and water. 2. You may remove the patch earlier than 72 hours if you experience unpleasant side effects which may include dry mouth, dizziness or visual disturbances. 3. Avoid touching the patch. Wash your hands with soap and water after contact with the patch.   Call your surgeon if you experience:   1.  Fever over 101.0. 2.  Inability to urinate. 3.  Nausea and/or vomiting. 4.  Extreme swelling or bruising at the surgical site. 5.  Continued bleeding from the incision. 6.  Increased pain, redness or drainage from the incision. 7.  Problems related to your pain medication. 8. Any change in color, movement and/or sensation 9. Any problems and/or  concerns

## 2014-08-10 NOTE — H&P (Signed)
Eric Shaw is an 54 y.o. male.   Chief Complaint: neck wound HPI: The patient is a 54 yrs old bm here for treatment of his neck ulcer.  He has undergone several debridements with Acell and VAC placement.  He is now ready for skin graft placement provided the area is clean and granulating.  He has several medical comorbidities but they are controlled at the moment.  Past Medical History  Diagnosis Date  . Plantar fasciitis, bilateral   . Hypertension   . High cholesterol   . Type II diabetes mellitus   . Arthritis     "all 10 toes" (06/19/2014)  . Depression   . Sleep apnea     "not suppose to wear mask" (06/19/2014)-does not have a cpap-  . Necrotizing fasciitis     Past Surgical History  Procedure Laterality Date  . Reduction of torsion of testis Left 1979  . Incision and drainage abscess N/A 06/24/2014    Procedure: INCISION AND DRAINAGE OF POSTERIOR NECK ABCESS;  Surgeon: Izora Gala, MD;  Location: [place];  Service: ENT;  Laterality: N/A;  . Wound debridement N/A 06/24/2014    Procedure: DEBRIDEMENT OF NECROTIZING FASCITIS ON POSTERIOR NECK;  Surgeon: Izora Gala, MD;  Location: [place];  Service: ENT;  Laterality: N/A;  . Incision and drainage abscess N/A 06/26/2014    Procedure: REPEAT DEBRIEDMENT OF NECK  WOUND;  Surgeon: Izora Gala, MD;  [facility];  Service: ENT;  Laterality: N/A;  . Incision and drainage abscess N/A 06/28/2014    Procedure:  REPEAT DEBRIEDMENT OF NECK  WOUND;  Surgeon: Izora Gala, MD;  Location: [place];  Service: ENT;  Laterality: N/A;  . Application of wound vac N/A 07/05/2014    Procedure: APPLICATION OF WOUND VAC;  Surgeon: Izora Gala, MD;  Location: [place];  Service: ENT;  Laterality: N/A;  . Incision and drainage of wound N/A 07/05/2014    Procedure: IRRIGATION AND DEBRIDEMENT WOUND;  Surgeon: Izora Gala, MD;  Location: [place];  Service: ENT;  Laterality: N/A;  . Application of a-cell of head/neck N/A 07/05/2014    Procedure: APPLICATION OF A-CELL OF  HEAD/NECK;  Surgeon: Izora Gala, MD;  Location: [place];  Service: ENT;  Laterality: N/A;  . Incision and drainage of wound N/A 07/20/2014    Procedure: IRRIGATION AND DEBRIDEMENT OF NECROTIZING FASCIITIS WOUND OF NECK WITH PLACEMENT OF ACELL/VAC;  Surgeon: Theodoro Kos, DO;  Location: [place];  Service: Plastics;  Laterality: N/A;  . Application of a-cell of head/neck N/A 07/20/2014    Procedure: APPLICATION OF A-CELL OF HEAD/NECK;  Surgeon: Theodoro Kos, DO;  Location: [place];  Service: Plastics;  Laterality: N/A;  . Incision and drainage of wound N/A 08/02/2014    Procedure: IRRIGATION AND DEBRIDEMENT OF POSTERIOR NECK WOUND WITH SURGICAL PREP AND PLACEMENT OF A CELL AND VAC;  Surgeon: Theodoro Kos, DO;  Location: [place];  Service: Plastics;  Laterality: N/A;  . Application of a-cell of head/neck N/A 08/02/2014    Procedure: APPLICATION OF A-CELL OF HEAD/NECK;  Surgeon: Theodoro Kos, DO;  Location: [place];  Service: Plastics;  Laterality: N/A;    Family History  Problem Relation Age of Onset  . Family history unknown: Yes   Social History:  reports that he has been smoking Cigarettes.  He has a 18 pack-year smoking history. He has never used smokeless tobacco. He reports that he drinks about 1.2 oz of alcohol per week.  He reports that he does not use illicit drugs.  Allergies: No Known Allergies  Medications Prior to Admission  Medication Sig Dispense Refill  . ARIPiprazole (ABILIFY) 5 MG tablet Take 5 mg by mouth every morning.    Marland Kitchen atenolol (TENORMIN) 50 MG tablet Take 50 mg by mouth daily.    . Blood Glucose Monitoring Suppl (MEIJER TRUERESULT GLUCOSE SYS) W/DEVICE KIT Check sugars 4 times a day 1 kit 0  . DULoxetine (CYMBALTA) 30 MG capsule Take 90 mg by mouth every morning.     . feeding supplement, GLUCERNA SHAKE, (GLUCERNA SHAKE) LIQD Take 237 mLs by mouth 2 (two) times daily between meals.  0  . gabapentin  (NEURONTIN) 300 MG capsule Take 1,200 mg by mouth 2 (two) times daily.     Marland Kitchen glucose blood (AGAMATRIX PRESTO TEST) test strip Use as instructed 100 each 11  . insulin aspart (NOVOLOG) 100 UNIT/ML injection Inject 3-20 Units into the skin 3 (three) times daily before meals. Per sliding scale. 3 vial PRN  . insulin detemir (LEVEMIR) 100 UNIT/ML injection Inject 0.5 mLs (50 Units total) into the skin 2 (two) times daily. 3 vial 11  . Lancets (ACCU-CHEK MULTICLIX) lancets Use as instructed 100 each 12  . lisinopril (PRINIVIL,ZESTRIL) 10 MG tablet Take 1 tablet (10 mg total) by mouth daily. For hypertension and renal protection. 30 tablet 0  . oxyCODONE-acetaminophen (PERCOCET/ROXICET) 5-325 MG per tablet Take 1-2 tablets by mouth every 4 (four) hours as needed for moderate pain or severe pain. 10 tablet 0    No results found for this or any previous visit (from the past 48 hour(s)). No results found.  Review of Systems  Constitutional: Negative.   HENT: Negative.   Eyes: Negative.   Respiratory: Negative.   Cardiovascular: Negative.   Gastrointestinal: Negative.   Genitourinary: Negative.   Musculoskeletal: Negative.   Skin: Negative.   Neurological: Negative.     Height 5' 10"  (1.778 m), weight 99.791 kg (220 lb). Physical Exam  Constitutional: He is oriented to person, place, and time. He appears well-developed and well-nourished.  HENT:  Head: Normocephalic and atraumatic.  Eyes: Conjunctivae are normal. Pupils are equal, round, and reactive to light.  Cardiovascular: Normal rate.   Respiratory: Effort normal.  GI: Soft.  Neurological: He is alert and oriented to person, place, and time.  Psychiatric: He has a normal mood and affect. His behavior is normal. Judgment and thought content normal.     Assessment/Plan Plan for STSG placement to the posterior neck with VAC placement.  Risks and complications were explained and include bleeding, pain, scar and need for additional  surgery.  [place] 08/10/2014, 7:15 AM

## 2014-08-11 ENCOUNTER — Encounter: Payer: Self-pay | Admitting: Internal Medicine

## 2014-08-11 ENCOUNTER — Ambulatory Visit: Payer: Medicare Other | Attending: Internal Medicine | Admitting: Internal Medicine

## 2014-08-11 VITALS — BP 150/83 | HR 96 | Temp 98.0°F | Resp 16 | Ht 70.0 in | Wt 219.0 lb

## 2014-08-11 DIAGNOSIS — E1140 Type 2 diabetes mellitus with diabetic neuropathy, unspecified: Secondary | ICD-10-CM | POA: Diagnosis not present

## 2014-08-11 DIAGNOSIS — M726 Necrotizing fasciitis: Secondary | ICD-10-CM | POA: Diagnosis not present

## 2014-08-11 DIAGNOSIS — Z720 Tobacco use: Secondary | ICD-10-CM | POA: Diagnosis not present

## 2014-08-11 DIAGNOSIS — F329 Major depressive disorder, single episode, unspecified: Secondary | ICD-10-CM | POA: Insufficient documentation

## 2014-08-11 DIAGNOSIS — Z9114 Patient's other noncompliance with medication regimen: Secondary | ICD-10-CM | POA: Insufficient documentation

## 2014-08-11 DIAGNOSIS — Z794 Long term (current) use of insulin: Secondary | ICD-10-CM | POA: Insufficient documentation

## 2014-08-11 DIAGNOSIS — E1142 Type 2 diabetes mellitus with diabetic polyneuropathy: Secondary | ICD-10-CM

## 2014-08-11 DIAGNOSIS — F17200 Nicotine dependence, unspecified, uncomplicated: Secondary | ICD-10-CM

## 2014-08-11 DIAGNOSIS — E780 Pure hypercholesterolemia: Secondary | ICD-10-CM | POA: Insufficient documentation

## 2014-08-11 DIAGNOSIS — Z79899 Other long term (current) drug therapy: Secondary | ICD-10-CM | POA: Insufficient documentation

## 2014-08-11 DIAGNOSIS — F17210 Nicotine dependence, cigarettes, uncomplicated: Secondary | ICD-10-CM | POA: Insufficient documentation

## 2014-08-11 DIAGNOSIS — I10 Essential (primary) hypertension: Secondary | ICD-10-CM | POA: Insufficient documentation

## 2014-08-11 LAB — GLUCOSE, POCT (MANUAL RESULT ENTRY): POC GLUCOSE: 340 mg/dL — AB (ref 70–99)

## 2014-08-11 MED ORDER — INSULIN ASPART 100 UNIT/ML ~~LOC~~ SOLN
15.0000 [IU] | Freq: Once | SUBCUTANEOUS | Status: AC
  Administered 2014-08-11: 15 [IU] via SUBCUTANEOUS

## 2014-08-11 MED ORDER — INSULIN SYRINGES (DISPOSABLE) U-100 1 ML MISC: Status: AC

## 2014-08-11 NOTE — Patient Instructions (Signed)
Smoking Cessation Quitting smoking is important to your health and has many advantages. However, it is not always easy to quit since nicotine is a very addictive drug. Oftentimes, people try 3 times or more before being able to quit. This document explains the best ways for you to prepare to quit smoking. Quitting takes hard work and a lot of effort, but you can do it. ADVANTAGES OF QUITTING SMOKING  You will live longer, feel better, and live better.  Your body will feel the impact of quitting smoking almost immediately.  Within 20 minutes, blood pressure decreases. Your pulse returns to its normal level.  After 8 hours, carbon monoxide levels in the blood return to normal. Your oxygen level increases.  After 24 hours, the chance of having a heart attack starts to decrease. Your breath, hair, and body stop smelling like smoke.  After 48 hours, damaged nerve endings begin to recover. Your sense of taste and smell improve.  After 72 hours, the body is virtually free of nicotine. Your bronchial tubes relax and breathing becomes easier.  After 2 to 12 weeks, lungs can hold more air. Exercise becomes easier and circulation improves.  The risk of having a heart attack, stroke, cancer, or lung disease is greatly reduced.  After 1 year, the risk of coronary heart disease is cut in half.  After 5 years, the risk of stroke falls to the same as a nonsmoker.  After 10 years, the risk of lung cancer is cut in half and the risk of other cancers decreases significantly.  After 15 years, the risk of coronary heart disease drops, usually to the level of a nonsmoker.  If you are pregnant, quitting smoking will improve your chances of having a healthy baby.  The people you live with, especially any children, will be healthier.  You will have extra money to spend on things other than cigarettes. QUESTIONS TO THINK ABOUT BEFORE ATTEMPTING TO QUIT You may want to talk about your answers with your  health care provider.  Why do you want to quit?  If you tried to quit in the past, what helped and what did not?  What will be the most difficult situations for you after you quit? How will you plan to handle them?  Who can help you through the tough times? Your family? Friends? A health care provider?  What pleasures do you get from smoking? What ways can you still get pleasure if you quit? Here are some questions to ask your health care provider:  How can you help me to be successful at quitting?  What medicine do you think would be best for me and how should I take it?  What should I do if I need more help?  What is smoking withdrawal like? How can I get information on withdrawal? GET READY  Set a quit date.  Change your environment by getting rid of all cigarettes, ashtrays, matches, and lighters in your home, car, or work. Do not let people smoke in your home.  Review your past attempts to quit. Think about what worked and what did not. GET SUPPORT AND ENCOURAGEMENT You have a better chance of being successful if you have help. You can get support in many ways.  Tell your family, friends, and coworkers that you are going to quit and need their support. Ask them not to smoke around you.  Get individual, group, or telephone counseling and support. Programs are available at local hospitals and health centers. Call   your local health department for information about programs in your area.  Spiritual beliefs and practices may help some smokers quit.  Download a "quit meter" on your computer to keep track of quit statistics, such as how long you have gone without smoking, cigarettes not smoked, and money saved.  Get a self-help book about quitting smoking and staying off tobacco. LEARN NEW SKILLS AND BEHAVIORS  Distract yourself from urges to smoke. Talk to someone, go for a walk, or occupy your time with a task.  Change your normal routine. Take a different route to work.  Drink tea instead of coffee. Eat breakfast in a different place.  Reduce your stress. Take a hot bath, exercise, or read a book.  Plan something enjoyable to do every day. Reward yourself for not smoking.  Explore interactive web-based programs that specialize in helping you quit. GET MEDICINE AND USE IT CORRECTLY Medicines can help you stop smoking and decrease the urge to smoke. Combining medicine with the above behavioral methods and support can greatly increase your chances of successfully quitting smoking.  Nicotine replacement therapy helps deliver nicotine to your body without the negative effects and risks of smoking. Nicotine replacement therapy includes nicotine gum, lozenges, inhalers, nasal sprays, and skin patches. Some may be available over-the-counter and others require a prescription.  Antidepressant medicine helps people abstain from smoking, but how this works is unknown. This medicine is available by prescription.  Nicotinic receptor partial agonist medicine simulates the effect of nicotine in your brain. This medicine is available by prescription. Ask your health care provider for advice about which medicines to use and how to use them based on your health history. Your health care provider will tell you what side effects to look out for if you choose to be on a medicine or therapy. Carefully read the information on the package. Do not use any other product containing nicotine while using a nicotine replacement product.  RELAPSE OR DIFFICULT SITUATIONS Most relapses occur within the first 3 months after quitting. Do not be discouraged if you start smoking again. Remember, most people try several times before finally quitting. You may have symptoms of withdrawal because your body is used to nicotine. You may crave cigarettes, be irritable, feel very hungry, cough often, get headaches, or have difficulty concentrating. The withdrawal symptoms are only temporary. They are strongest  when you first quit, but they will go away within 10-14 days. To reduce the chances of relapse, try to:  Avoid drinking alcohol. Drinking lowers your chances of successfully quitting.  Reduce the amount of caffeine you consume. Once you quit smoking, the amount of caffeine in your body increases and can give you symptoms, such as a rapid heartbeat, sweating, and anxiety.  Avoid smokers because they can make you want to smoke.  Do not let weight gain distract you. Many smokers will gain weight when they quit, usually less than 10 pounds. Eat a healthy diet and stay active. You can always lose the weight gained after you quit.  Find ways to improve your mood other than smoking. FOR MORE INFORMATION  www.smokefree.gov  Document Released: 04/15/2001 Document Revised: 09/05/2013 Document Reviewed: 07/31/2011 Exit[employer] Patient Information 2015 [employer]. This information is not intended to replace advice given to you by your health care provider. Make sure you discuss any questions you have with your health care provider.  

## 2014-08-11 NOTE — Progress Notes (Signed)
Pt is here following up on his HTN and diabetes. Pt needs to have a PCP. Pt needs refills on his diabetic supply's.

## 2014-08-11 NOTE — Progress Notes (Signed)
Patient ID: Eric Shaw, male   DOB: 1960-05-12, 54 y.o.   MRN: [id number]  CC: f/u   HPI: Eric Shaw is a 54 y.o. male here today for a follow up visit.  Patient has past medical history of T2DM, HLD, HTN, depression, and recent diagnosis of necrotizing fascitis. He had initial surgery on 06/26/14 and has since had several I&D's with the last being yesterday for grafting. He continues to have a wound vac in place that is managed by a home care nurse and surgery team. He is here to establish care with a [facility] provider. He reports that he does not take his medication as prescribed. He refuses to quit smoking or give up salty foods. He believes that smoking and diabetes will not be the death of him.   Patient has No headache, No chest pain, No abdominal pain - No Nausea, No Cough - SOB.  No Known Allergies Past Medical History  Diagnosis Date  . Plantar fasciitis, bilateral   . Hypertension   . High cholesterol   . Type II diabetes mellitus   . Arthritis     "all 10 toes" (06/19/2014)  . Depression   . Sleep apnea     "not suppose to wear mask" (06/19/2014)-does not have a cpap-  . Necrotizing fasciitis    Current Outpatient Prescriptions on File Prior to Visit  Medication Sig Dispense Refill  . ARIPiprazole (ABILIFY) 5 MG tablet Take 5 mg by mouth every morning.    Marland Kitchen atenolol (TENORMIN) 50 MG tablet Take 50 mg by mouth daily.    . Blood Glucose Monitoring Suppl (MEIJER TRUERESULT GLUCOSE SYS) W/DEVICE KIT Check sugars 4 times a day 1 kit 0  . DULoxetine (CYMBALTA) 30 MG capsule Take 90 mg by mouth every morning.     . feeding supplement, GLUCERNA SHAKE, (GLUCERNA SHAKE) LIQD Take 237 mLs by mouth 2 (two) times daily between meals.  0  . gabapentin (NEURONTIN) 300 MG capsule Take 1,200 mg by mouth 2 (two) times daily.     Marland Kitchen glucose blood (AGAMATRIX PRESTO TEST) test strip Use as instructed 100 each 11  . insulin aspart (NOVOLOG) 100 UNIT/ML injection Inject 3-20 Units into the skin 3  (three) times daily before meals. Per sliding scale. 3 vial PRN  . insulin detemir (LEVEMIR) 100 UNIT/ML injection Inject 0.5 mLs (50 Units total) into the skin 2 (two) times daily. 3 vial 11  . Lancets (ACCU-CHEK MULTICLIX) lancets Use as instructed 100 each 12  . lisinopril (PRINIVIL,ZESTRIL) 10 MG tablet Take 1 tablet (10 mg total) by mouth daily. For hypertension and renal protection. 30 tablet 0  . oxyCODONE-acetaminophen (PERCOCET/ROXICET) 5-325 MG per tablet Take 1-2 tablets by mouth every 4 (four) hours as needed for moderate pain or severe pain. 10 tablet 0   No current facility-administered medications on file prior to visit.   Family History  Problem Relation Age of Onset  . Family history unknown: Yes   History   Social History  . Marital Status: Single    Spouse Name: N/A  . Number of Children: N/A  . Years of Education: N/A   Occupational History  . Not on file.   Social History Main Topics  . Smoking status: Current Every Day Smoker -- 0.50 packs/day for 36 years    Types: Cigarettes  . Smokeless tobacco: Never Used  . Alcohol Use: 1.2 oz/week    2 Cans of beer per week     Comment: Occasionally; has not had  any etoh in 8 months  . Drug Use: No  . Sexual Activity: Not Currently   Other Topics Concern  . Not on file   Social History Narrative    Review of Systems  Eyes: Positive for blurred vision.  Genitourinary: Negative for frequency.  Neurological: Positive for tingling. Negative for dizziness and headaches.  Endo/Heme/Allergies: Negative for polydipsia.  All other systems reviewed and are negative.     Objective:   Filed Vitals:   08/11/14 1154  BP: 150/83  Pulse: 96  Temp: 98 F (36.7 C)  Resp: 16    Physical Exam  Constitutional: He is oriented to person, place, and time.  Cardiovascular: Normal rate, regular rhythm and normal heart sounds.   Pulses:      Dorsalis pedis pulses are 2+ on the right side, and 2+ on the left side.        Posterior tibial pulses are 2+ on the right side, and 2+ on the left side.  Pulmonary/Chest: Effort normal and breath sounds normal.  Abdominal: Soft. Bowel sounds are normal.  Feet:  Right Foot:  Skin Integrity: Negative for skin breakdown.  Left Foot:  Skin Integrity: Negative for skin breakdown.  Neurological: He is alert and oriented to person, place, and time.  Skin: Skin is warm and dry.  Psychiatric: He has a normal mood and affect.   .  Lab Results  Component Value Date   WBC 7.3 07/05/2014   HGB 16.0 08/10/2014   HCT 47.0 08/10/2014   MCV 92.2 07/05/2014   PLT 500* 07/05/2014   Lab Results  Component Value Date   CREATININE 0.70 08/10/2014   BUN 13 08/10/2014   NA 139 08/10/2014   K 3.9 08/10/2014   CL 102 08/10/2014   CO2 25 07/18/2014    Lab Results  Component Value Date   HGBA1C 11.5* 06/19/2014   Lipid Panel     Component Value Date/Time   CHOL 104 06/19/2014 1846   TRIG 120 06/19/2014 1846   HDL 18* 06/19/2014 1846   CHOLHDL 5.8 06/19/2014 1846   VLDL 24 06/19/2014 1846   LDLCALC 62 06/19/2014 1846       Assessment and plan:   Eric Shaw was seen today for follow-up.  Diagnoses and all orders for this visit:  Type 2 diabetes mellitus with peripheral neuropathy Orders: -     POCT glucose (manual entry) -     Microalbumin, urine -     insulin aspart (novoLOG) injection 15 Units; Inject 0.15 mLs (15 Units total) into the skin once. -     Insulin Syringes, Disposable, U-100 1 ML MISC; Use Novolog 3 times per day I have advised patient to take medication daily as prescribed. I have went over consequences of non-compliance and untreated diabetes including poor wound healing, increased risk of heart disease and stroke, kidney disease, etc\  Necrotizing Fasciitis  Went over wound care and how to proper care for graft site. Patient will continue f/u with surgeon and home care nurse  Smoker Smoking cessation discussed for 3 minutes, patient is not  willing to quit at this time. Will continue to assess on each visit. Discussed increased risk for diseases such as cancer, heart disease, and stroke.  Patient states that he will not quit smoking.   Return in about 2 months (around 10/11/2014).        Chari Manning, [facility] [phone] 08/11/2014, 11:56 AM

## 2014-08-12 LAB — MICROALBUMIN, URINE: Microalb, Ur: 7.9 mg/dL — ABNORMAL HIGH (ref <2.0)

## 2014-11-17 ENCOUNTER — Encounter (HOSPITAL_COMMUNITY): Payer: Self-pay | Admitting: *Deleted

## 2014-11-17 ENCOUNTER — Emergency Department (INDEPENDENT_AMBULATORY_CARE_PROVIDER_SITE_OTHER)
Admission: EM | Admit: 2014-11-17 | Discharge: 2014-11-17 | Disposition: A | Discharge status: Home / Self Care | Payer: Medicare Other | Source: Home / Self Care | Attending: Family Medicine | Admitting: Family Medicine

## 2014-11-17 DIAGNOSIS — L02411 Cutaneous abscess of right axilla: Secondary | ICD-10-CM | POA: Diagnosis not present

## 2014-11-17 MED ORDER — CEPHALEXIN 500 MG PO CAPS: 500.0000 mg | ORAL_CAPSULE | Freq: Four times a day (QID) | ORAL | Status: DC

## 2014-11-17 MED ORDER — LIDOCAINE HCL (PF) 2 % IJ SOLN
INTRAMUSCULAR | Status: AC
  Filled 2014-11-17: qty 2

## 2014-11-17 MED ORDER — SULFAMETHOXAZOLE-TRIMETHOPRIM 800-160 MG PO TABS: 1.0000 | ORAL_TABLET | Freq: Two times a day (BID) | ORAL | Status: AC

## 2014-11-17 NOTE — ED Provider Notes (Signed)
CSN: [id number]     Arrival date & time 11/17/14  1438 History   First MD Initiated Contact with Patient 11/17/14 1514     Chief Complaint  Patient presents with  . Recurrent Skin Infections   (Consider location/radiation/quality/duration/timing/severity/associated sxs/prior Treatment) Patient is a 54 y.o. male presenting with rash. The history is provided by the patient. No language interpreter was used.  Rash Location:  Shoulder/arm Shoulder/arm rash location:  R axilla Quality: redness and swelling   Severity:  Moderate Onset quality:  Gradual Duration:  4 days Timing:  Constant Progression:  Worsening Chronicity:  New Relieved by:  Nothing Worsened by:  Nothing tried Ineffective treatments:  None tried Associated symptoms: no fever   Pt complains of an abscess under his right arm.  Pt reports he has a history of a staph infection/necrotizing fascitis this year.   Past Medical History  Diagnosis Date  . Plantar fasciitis, bilateral   . Hypertension   . High cholesterol   . Type II diabetes mellitus   . Arthritis     "all 10 toes" (06/19/2014)  . Depression   . Sleep apnea     "not suppose to wear mask" (06/19/2014)-does not have a cpap-  . Necrotizing fasciitis    Past Surgical History  Procedure Laterality Date  . Reduction of torsion of testis Left 1979  . Incision and drainage abscess N/A 06/24/2014    Procedure: INCISION AND DRAINAGE OF POSTERIOR NECK ABCESS;  Surgeon: Izora Gala, MD;  Location: [place];  Service: ENT;  Laterality: N/A;  . Wound debridement N/A 06/24/2014    Procedure: DEBRIDEMENT OF NECROTIZING FASCITIS ON POSTERIOR NECK;  Surgeon: Izora Gala, MD;  Location: [place];  Service: ENT;  Laterality: N/A;  . Incision and drainage abscess N/A 06/26/2014    Procedure: REPEAT DEBRIEDMENT OF NECK  WOUND;  Surgeon: Izora Gala, MD;  Location: [place];  Service: ENT;  Laterality: N/A;  . Incision and drainage abscess N/A 06/28/2014    Procedure:  REPEAT DEBRIEDMENT  OF NECK  WOUND;  Surgeon: Izora Gala, MD;  Location: [place];  Service: ENT;  Laterality: N/A;  . Application of wound vac N/A 07/05/2014    Procedure: APPLICATION OF WOUND VAC;  Surgeon: Izora Gala, MD;  Location: [place];  Service: ENT;  Laterality: N/A;  . Incision and drainage of wound N/A 07/05/2014    Procedure: IRRIGATION AND DEBRIDEMENT WOUND;  Surgeon: Izora Gala, MD;  Location: [place];  Service: ENT;  Laterality: N/A;  . Application of a-cell of head/neck N/A 07/05/2014    Procedure: APPLICATION OF A-CELL OF HEAD/NECK;  Surgeon: Izora Gala, MD;  Location: [place];  Service: ENT;  Laterality: N/A;  . Incision and drainage of wound N/A 07/20/2014    Procedure: IRRIGATION AND DEBRIDEMENT OF NECROTIZING FASCIITIS WOUND OF NECK WITH PLACEMENT OF ACELL/VAC;  Surgeon: Theodoro Kos, DO;  Location: [place];  Service: Plastics;  Laterality: N/A;  . Application of a-cell of head/neck N/A 07/20/2014    Procedure: APPLICATION OF A-CELL OF HEAD/NECK;  Surgeon: Theodoro Kos, DO;  Location: [place];  Service: Plastics;  Laterality: N/A;  . Incision and drainage of wound N/A 08/02/2014    Procedure: IRRIGATION AND DEBRIDEMENT OF POSTERIOR NECK WOUND WITH SURGICAL PREP AND PLACEMENT OF A CELL AND VAC;  Surgeon: Theodoro Kos, DO;  Location: [place];  Service: Plastics;  Laterality: N/A;  . Application of a-cell of head/neck N/A 08/02/2014    Procedure:  APPLICATION OF A-CELL OF HEAD/NECK;  Surgeon: Theodoro Kos, DO;  Location: [place];  Service: Plastics;  Laterality: N/A;  . Incision and drainage of wound N/A 08/10/2014    Procedure: IRRIGATION AND DEBRIDEMENT OF POSTERIOR NECK WOUND WITH SURGICAL PREP WITH SPLIT THICKNESS SKIN GRAFT AND PLACEMENT OF VAC;  Surgeon: Theodoro Kos, DO;  Location: [place];  Service: Plastics;  Laterality: N/A;  . Skin split graft N/A 08/10/2014    Procedure: SKIN GRAFT SPLIT THICKNESS;  Surgeon:  Theodoro Kos, DO;  Location: [place];  Service: Plastics;  Laterality: N/A;  From Left Posterior Thigh   Family History  Problem Relation Age of Onset  . Family history unknown: Yes   History  Substance Use Topics  . Smoking status: Current Every Day Smoker -- 0.50 packs/day for 36 years    Types: Cigarettes  . Smokeless tobacco: Never Used  . Alcohol Use: 1.2 oz/week    2 Cans of beer per week     Comment: Occasionally; has not had any etoh in 8 months    Review of Systems  Constitutional: Negative for fever.  Skin: Positive for rash.  All other systems reviewed and are negative.   Allergies  Review of patient's allergies indicates no known allergies.  Home Medications   Prior to Admission medications   Medication Sig Start Date End Date Taking? Authorizing Provider  ARIPiprazole (ABILIFY) 5 MG tablet Take 5 mg by mouth every morning.    Historical Provider, MD  atenolol (TENORMIN) 50 MG tablet Take 50 mg by mouth daily.    Historical Provider, MD  Blood Glucose Monitoring Suppl (MEIJER TRUERESULT GLUCOSE SYS) W/DEVICE KIT Check sugars 4 times a day 07/14/14   Micheline Chapman, NP  DULoxetine (CYMBALTA) 30 MG capsule Take 90 mg by mouth every morning.     Historical Provider, MD  feeding supplement, GLUCERNA SHAKE, (GLUCERNA SHAKE) LIQD Take 237 mLs by mouth 2 (two) times daily between meals. Patient not taking: Reported on 08/11/2014 07/06/14   Hosie Poisson, MD  gabapentin (NEURONTIN) 300 MG capsule Take 1,200 mg by mouth 2 (two) times daily.     Historical Provider, MD  glucose blood (AGAMATRIX PRESTO TEST) test strip Use as instructed 07/14/14   Micheline Chapman, NP  insulin aspart (NOVOLOG) 100 UNIT/ML injection Inject 3-20 Units into the skin 3 (three) times daily before meals. Per sliding scale. 07/14/14   Micheline Chapman, NP  insulin detemir (LEVEMIR) 100 UNIT/ML injection Inject 0.5 mLs (50 Units total) into the skin 2 (two) times daily. 07/14/14   Micheline Chapman, NP  Insulin Syringes, Disposable, U-100 1 ML MISC Use Novolog 3 times per day 08/11/14   Lance Bosch, NP  Lancets (ACCU-CHEK MULTICLIX) lancets Use as instructed 07/14/14   Micheline Chapman, NP  lisinopril (PRINIVIL,ZESTRIL) 10 MG tablet Take 1 tablet (10 mg total) by mouth daily. For hypertension and renal protection. 12/31/12   Ruben Im, PA-C  metFORMIN (GLUCOPHAGE) 1000 MG tablet Take 1,000 mg by mouth 2 (two) times daily with a meal.    Historical Provider, MD  oxyCODONE-acetaminophen (PERCOCET/ROXICET) 5-325 MG per tablet Take 1-2 tablets by mouth every 4 (four) hours as needed for moderate pain or severe pain. 07/06/14   Hosie Poisson, MD   BP 125/69 mmHg  Pulse 106  Temp(Src) 98.8 F (37.1 C) (Oral)  Resp 16  SpO2 97% Physical Exam  Constitutional: He is oriented to person, place, and  time. He appears well-developed and well-nourished.  HENT:  Head: Normocephalic and atraumatic.  Eyes: EOM are normal. Pupils are equal, round, and reactive to light.  Neck: Normal range of motion.  Cardiovascular: Normal rate.   Pulmonary/Chest: Effort normal.  Abdominal: He exhibits no distension.  Musculoskeletal: He exhibits tenderness.  Swelling right axilla 2x3 cm abscess, 1x1 cm abcess  Neurological: He is alert and oriented to person, place, and time.  Skin: Skin is warm.  Psychiatric: He has a normal mood and affect.  Nursing note and vitals reviewed.   ED Course  INCISION AND DRAINAGE Date/Time: 11/17/2014 4:54 PM Performed by: Fransico Meadow Authorized by: Marily Memos, DAVID J Consent: Verbal consent obtained. Written consent not obtained. Risks and benefits: risks, benefits and alternatives were discussed Consent given by: patient Patient identity confirmed: verbally with patient Type: abscess Body area: upper extremity Anesthesia: local infiltration Local anesthetic: lidocaine 2% without epinephrine Scalpel size: 11 Incision type: single straight Complexity:  simple Drainage: purulent Drainage amount: moderate Wound treatment: wound left open Patient tolerance: Patient tolerated the procedure well with no immediate complications Comments: 2 areas excised,  Moderate drainage,  Areas left open    (including critical care time) Labs Review Labs Reviewed - No data to display  Imaging Review No results found.     Dr. Marily Memos in to see and examine pt.  Pt given rx for bactrim and keflex,  He declined pain medications.   Pt counseled extensively about going to ED if symptoms worsened.   1. Abscess of axilla, right    Keflex Bactrim See your Physician on Monday for recheck    Fransico Meadow, PA-C 11/17/14 1702

## 2014-11-17 NOTE — Discharge Instructions (Signed)

## 2014-11-17 NOTE — ED Notes (Signed)
Pt  Reports   Pain  And  Swelling  Under  r  Armpit       X  4  Days       Pt is  A  Diabetic     History      Of     mrsa

## 2014-11-20 ENCOUNTER — Encounter: Payer: Self-pay | Admitting: Internal Medicine

## 2014-11-20 ENCOUNTER — Ambulatory Visit: Payer: Medicare Other | Attending: Internal Medicine | Admitting: Internal Medicine

## 2014-11-20 VITALS — BP 124/75 | HR 98 | Temp 98.6°F | Resp 18 | Ht 70.0 in | Wt 212.2 lb

## 2014-11-20 DIAGNOSIS — E1142 Type 2 diabetes mellitus with diabetic polyneuropathy: Secondary | ICD-10-CM

## 2014-11-20 DIAGNOSIS — L02411 Cutaneous abscess of right axilla: Secondary | ICD-10-CM | POA: Diagnosis not present

## 2014-11-20 DIAGNOSIS — Z91148 Patient's other noncompliance with medication regimen for other reason: Secondary | ICD-10-CM | POA: Insufficient documentation

## 2014-11-20 DIAGNOSIS — Z9114 Patient's other noncompliance with medication regimen: Secondary | ICD-10-CM | POA: Diagnosis not present

## 2014-11-20 DIAGNOSIS — E1140 Type 2 diabetes mellitus with diabetic neuropathy, unspecified: Secondary | ICD-10-CM | POA: Diagnosis not present

## 2014-11-20 LAB — POCT URINALYSIS DIPSTICK
BILIRUBIN UA: NEGATIVE
GLUCOSE UA: 500
Ketones, UA: NEGATIVE
LEUKOCYTES UA: NEGATIVE
Nitrite, UA: NEGATIVE
Protein, UA: 30
SPEC GRAV UA: 1.02
Urobilinogen, UA: 0.2
pH, UA: 6

## 2014-11-20 LAB — GLUCOSE, POCT (MANUAL RESULT ENTRY)
POC GLUCOSE: 355 mg/dL — AB (ref 70–99)
POC Glucose: 323 mg/dl — AB (ref 70–99)

## 2014-11-20 LAB — CULTURE, ROUTINE-ABSCESS
GRAM STAIN: NONE SEEN
Special Requests: NORMAL

## 2014-11-20 LAB — POCT GLYCOSYLATED HEMOGLOBIN (HGB A1C): HEMOGLOBIN A1C: 13

## 2014-11-20 MED ORDER — INSULIN ASPART 100 UNIT/ML ~~LOC~~ SOLN
20.0000 [IU] | Freq: Once | SUBCUTANEOUS | Status: AC
  Administered 2014-11-20: 20 [IU] via SUBCUTANEOUS

## 2014-11-20 NOTE — Progress Notes (Signed)
Patient here with complaints of 2 masses under right arm.  [facility] and was put on Keflex and Bactrim because it was positive for Staph.  Patient states he was instructed to go to the ER if the masses came back.  Patient reports masses are still there, but do not seem to be getting worse.  Patient states the bottom mass is still draining, and he squeezed it this morning causing some pus to come out.  Patient states these masses have been around for 7-9 days and he is worried because of history of MRSA, he wants to make sure they are healing properly and that he does not need to go to the ER.  Patient states pain is 3/10, sharp/sore from drainage site, and now beginning to itch.  Patient CBG: 355  A1C: 13.0

## 2014-11-20 NOTE — Patient Instructions (Signed)
Please take your insulin keep a sugar log. Please bring your sugar log with you on Friday. I would like you to check it 3 times per day.

## 2014-11-20 NOTE — Progress Notes (Signed)
Patient ID: Eric Shaw, male   DOB: 1960-07-29, 54 y.o.   MRN: [id number]  CC: abscess  HPI: Eric Shaw is a 54 y.o. male here today for a follow up visit.  Patient has past medical history of necrotizing fascitis, HTN, T2DM, sleep apnea, and depression. Patient here with complaints of 2 masses under right arm. Patient states he went to the urgent care 3 days ago to have them drained and was put on Keflex and Bactrim because it was positive for Staph. Patient states he was instructed to go to the ER if the masses came back. Patient reports masses are still there. He has used 3 days of Keflex and Bactrim so far. Patient states the bottom mass is still large and draining purulent discharge. Patient states these masses have been present for 7-9 days and he is worried because of history of MRSA, Patient states pain is 3/10, sharp/sore from drainage site, and now beginning to itch.  He reports that he does not check his sugars like he should and he uses the insulin as needed. He also does not follow a diabetic diet.   Patient has No headache, No chest pain, No abdominal pain - No Nausea, No new weakness tingling or numbness, No Cough - SOB.  No Known Allergies Past Medical History  Diagnosis Date  . Plantar fasciitis, bilateral   . Hypertension   . High cholesterol   . Type II diabetes mellitus   . Arthritis     "all 10 toes" (06/19/2014)  . Depression   . Sleep apnea     "not suppose to wear mask" (06/19/2014)-does not have a cpap-  . Necrotizing fasciitis    Current Outpatient Prescriptions on File Prior to Visit  Medication Sig Dispense Refill  . ARIPiprazole (ABILIFY) 5 MG tablet Take 5 mg by mouth every morning.    Marland Kitchen atenolol (TENORMIN) 50 MG tablet Take 50 mg by mouth daily.    . Blood Glucose Monitoring Suppl (MEIJER TRUERESULT GLUCOSE SYS) W/DEVICE KIT Check sugars 4 times a day 1 kit 0  . cephALEXin (KEFLEX) 500 MG capsule Take 1 capsule (500 mg total) by mouth 4 (four) times  daily. 40 capsule 0  . DULoxetine (CYMBALTA) 30 MG capsule Take 90 mg by mouth every morning.     . gabapentin (NEURONTIN) 300 MG capsule Take 1,200 mg by mouth 2 (two) times daily.     Marland Kitchen glucose blood (AGAMATRIX PRESTO TEST) test strip Use as instructed 100 each 11  . insulin aspart (NOVOLOG) 100 UNIT/ML injection Inject 3-20 Units into the skin 3 (three) times daily before meals. Per sliding scale. 3 vial PRN  . insulin detemir (LEVEMIR) 100 UNIT/ML injection Inject 0.5 mLs (50 Units total) into the skin 2 (two) times daily. 3 vial 11  . Insulin Syringes, Disposable, U-100 1 ML MISC Use Novolog 3 times per day 100 each 5  . Lancets (ACCU-CHEK MULTICLIX) lancets Use as instructed 100 each 12  . lisinopril (PRINIVIL,ZESTRIL) 10 MG tablet Take 1 tablet (10 mg total) by mouth daily. For hypertension and renal protection. 30 tablet 0  . metFORMIN (GLUCOPHAGE) 1000 MG tablet Take 1,000 mg by mouth 2 (two) times daily with a meal.    . sulfamethoxazole-trimethoprim (BACTRIM DS,SEPTRA DS) 800-160 MG per tablet Take 1 tablet by mouth 2 (two) times daily. 20 tablet 0  . feeding supplement, GLUCERNA SHAKE, (GLUCERNA SHAKE) LIQD Take 237 mLs by mouth 2 (two) times daily between meals. (Patient not taking: Reported on  08/11/2014)  0  . oxyCODONE-acetaminophen (PERCOCET/ROXICET) 5-325 MG per tablet Take 1-2 tablets by mouth every 4 (four) hours as needed for moderate pain or severe pain. (Patient not taking: Reported on 11/20/2014) 10 tablet 0   No current facility-administered medications on file prior to visit.   Family History  Problem Relation Age of Onset  . Family history unknown: Yes   History   Social History  . Marital Status: Single    Spouse Name: N/A  . Number of Children: N/A  . Years of Education: N/A   Occupational History  . Not on file.   Social History Main Topics  . Smoking status: Current Every Day Smoker -- 0.50 packs/day for 36 years    Types: Cigarettes  . Smokeless tobacco:  Never Used  . Alcohol Use: No     Comment: Occasionally; has not had any etoh in 8 months  . Drug Use: No  . Sexual Activity: Not Currently   Other Topics Concern  . Not on file   Social History Narrative    Review of Systems: See HPI    Objective:   Filed Vitals:   11/20/14 1453  BP: 124/75  Pulse: 98  Temp: 98.6 F (37 C)  Resp: 18    Physical Exam  Constitutional: He is oriented to person, place, and time.  Cardiovascular: Normal rate, regular rhythm and normal heart sounds.   Pulmonary/Chest: Effort normal and breath sounds normal.  Musculoskeletal: He exhibits no edema.  Neurological: He is alert and oriented to person, place, and time.  Skin:  Two separate abscess on right axilla Large hardened area with cellulitis    Lab Results  Component Value Date   WBC 7.3 07/05/2014   HGB 16.0 08/10/2014   HCT 47.0 08/10/2014   MCV 92.2 07/05/2014   PLT 500* 07/05/2014   Lab Results  Component Value Date   CREATININE 0.70 08/10/2014   BUN 13 08/10/2014   NA 139 08/10/2014   K 3.9 08/10/2014   CL 102 08/10/2014   CO2 25 07/18/2014    Lab Results  Component Value Date   HGBA1C 13 11/20/2014   Lipid Panel     Component Value Date/Time   CHOL 104 06/19/2014 1846   TRIG 120 06/19/2014 1846   HDL 18* 06/19/2014 1846   CHOLHDL 5.8 06/19/2014 1846   VLDL 24 06/19/2014 1846   LDLCALC 62 06/19/2014 1846       Assessment and plan:   Eric Shaw was seen today for under arm lumps.  Diagnoses and all orders for this visit:  Type 2 diabetes mellitus with peripheral neuropathy Orders: -     POCT glucose (manual entry) -     POCT glycosylated hemoglobin (Hb A1C) -     POCT urinalysis dipstick -     insulin aspart (novoLOG) injection 20 Units; Inject 0.2 mLs (20 Units total) into the skin once. I have explained to patient the consequences and delayed healing due to beinggnon-compliant with medication regimen. Patient will begin back on insulin regimen and bring c   log back on Friday for review.   Abscess of right axilla Will continue to take bactrim and keflex and return Friday for wound recheck.   Non-Compliant with medication regimen See above   Return for Friday PCP--wound check and 4 weeks RN-cbg log review, 3 mo PCP DM.       Lance Bosch, [facility] [phone] 11/20/2014, 3:13 PM

## 2014-11-21 NOTE — ED Notes (Signed)
Final report positive for staph aureus, treatment adequate w Rx provided

## 2014-11-24 ENCOUNTER — Encounter: Payer: Self-pay | Admitting: Internal Medicine

## 2014-11-24 ENCOUNTER — Ambulatory Visit: Payer: Medicare Other | Attending: Internal Medicine | Admitting: Internal Medicine

## 2014-11-24 DIAGNOSIS — L02411 Cutaneous abscess of right axilla: Secondary | ICD-10-CM | POA: Insufficient documentation

## 2014-11-24 DIAGNOSIS — Z794 Long term (current) use of insulin: Secondary | ICD-10-CM | POA: Insufficient documentation

## 2014-11-24 DIAGNOSIS — F17210 Nicotine dependence, cigarettes, uncomplicated: Secondary | ICD-10-CM | POA: Insufficient documentation

## 2014-11-24 DIAGNOSIS — E1165 Type 2 diabetes mellitus with hyperglycemia: Secondary | ICD-10-CM | POA: Diagnosis not present

## 2014-11-24 LAB — POCT URINALYSIS DIPSTICK
Bilirubin, UA: NEGATIVE
Blood, UA: NEGATIVE
Glucose, UA: 500
Ketones, UA: NEGATIVE
Leukocytes, UA: NEGATIVE
Nitrite, UA: NEGATIVE
PH UA: 5.5
Spec Grav, UA: 1.01
UROBILINOGEN UA: 0.2

## 2014-11-24 LAB — GLUCOSE, POCT (MANUAL RESULT ENTRY)
POC GLUCOSE: 426 mg/dL — AB (ref 70–99)
POC Glucose: 433 mg/dl — AB (ref 70–99)

## 2014-11-24 MED ORDER — INSULIN ASPART 100 UNIT/ML ~~LOC~~ SOLN
20.0000 [IU] | Freq: Once | SUBCUTANEOUS | Status: AC
  Administered 2014-11-24: 20 [IU] via SUBCUTANEOUS

## 2014-11-24 NOTE — Progress Notes (Signed)
Patient here for wound check located under right arm pit. Patient denies any pain today. Patient reports his wound is no longer sore. Patient CBG is 433. Patient last ate at 9am. Patient ate a sausage and egg mcmuffin, hashbrown, and unsweet tea. Patient took 50 units of levemir and 8 units of novolog at 7:30 am. Patient has taken his morning medications for today and does not need any refills. Patient smokes 1 pack of cigarettes daily.

## 2014-11-24 NOTE — Patient Instructions (Signed)
Novolog Sliding Scale  120-150 give 3 units  151-200, give 6 units  201-250, give 10 units  251-300, give 14 units  301-350, give 17 units  351-400, give 20 units

## 2014-11-24 NOTE — Progress Notes (Signed)
Patient ID: Eric Shaw, male   DOB: 04-Jul-1960, 54 y.o.   MRN: [id number]  CC: wound check   HPI: Eric Shaw is a 54 y.o. male here today for a follow up visit.  Patient has past medical history of T2DM, HTN, HLD. He was seen here last week for a right axilla abscess. Today he continues to take Bactrim for infection but has not completed therapy yet. He states that since his last visit he has noticed a difference in the size of the abscess and it is not as painful. He feels like it is improving. He also states that his sugar was 170 this morning before he ate. He notes that he ate a egg mcmuffine and hash browns before this appointment. He states that he took 50 units of Levemir and 8 units of Novolog around 7:30 am.   Patient has No headache, No chest pain, No abdominal pain - No Nausea, No new weakness tingling or numbness, No Cough - SOB.  No Known Allergies Past Medical History  Diagnosis Date  . Plantar fasciitis, bilateral   . Hypertension   . High cholesterol   . Type II diabetes mellitus   . Arthritis     "all 10 toes" (06/19/2014)  . Depression   . Sleep apnea     "not suppose to wear mask" (06/19/2014)-does not have a cpap-  . Necrotizing fasciitis    Current Outpatient Prescriptions on File Prior to Visit  Medication Sig Dispense Refill  . ARIPiprazole (ABILIFY) 5 MG tablet Take 5 mg by mouth every morning.    Marland Kitchen atenolol (TENORMIN) 50 MG tablet Take 50 mg by mouth daily.    . Blood Glucose Monitoring Suppl (MEIJER TRUERESULT GLUCOSE SYS) W/DEVICE KIT Check sugars 4 times a day 1 kit 0  . cephALEXin (KEFLEX) 500 MG capsule Take 1 capsule (500 mg total) by mouth 4 (four) times daily. 40 capsule 0  . DULoxetine (CYMBALTA) 30 MG capsule Take 90 mg by mouth every morning.     . feeding supplement, GLUCERNA SHAKE, (GLUCERNA SHAKE) LIQD Take 237 mLs by mouth 2 (two) times daily between meals. (Patient not taking: Reported on 08/11/2014)  0  . gabapentin (NEURONTIN) 300 MG capsule  Take 1,200 mg by mouth 2 (two) times daily.     Marland Kitchen glucose blood (AGAMATRIX PRESTO TEST) test strip Use as instructed 100 each 11  . insulin aspart (NOVOLOG) 100 UNIT/ML injection Inject 3-20 Units into the skin 3 (three) times daily before meals. Per sliding scale. 3 vial PRN  . insulin detemir (LEVEMIR) 100 UNIT/ML injection Inject 0.5 mLs (50 Units total) into the skin 2 (two) times daily. 3 vial 11  . Insulin Syringes, Disposable, U-100 1 ML MISC Use Novolog 3 times per day 100 each 5  . Lancets (ACCU-CHEK MULTICLIX) lancets Use as instructed 100 each 12  . lisinopril (PRINIVIL,ZESTRIL) 10 MG tablet Take 1 tablet (10 mg total) by mouth daily. For hypertension and renal protection. 30 tablet 0  . metFORMIN (GLUCOPHAGE) 1000 MG tablet Take 1,000 mg by mouth 2 (two) times daily with a meal.    . oxyCODONE-acetaminophen (PERCOCET/ROXICET) 5-325 MG per tablet Take 1-2 tablets by mouth every 4 (four) hours as needed for moderate pain or severe pain. (Patient not taking: Reported on 11/20/2014) 10 tablet 0  . sulfamethoxazole-trimethoprim (BACTRIM DS,SEPTRA DS) 800-160 MG per tablet Take 1 tablet by mouth 2 (two) times daily. 20 tablet 0   No current facility-administered medications on file prior to  visit.   Family History  Problem Relation Age of Onset  . Family history unknown: Yes   History   Social History  . Marital Status: Single    Spouse Name: N/A  . Number of Children: N/A  . Years of Education: N/A   Occupational History  . Not on file.   Social History Main Topics  . Smoking status: Current Every Day Smoker -- 0.50 packs/day for 36 years    Types: Cigarettes  . Smokeless tobacco: Never Used  . Alcohol Use: No     Comment: Occasionally; has not had any etoh in 8 months  . Drug Use: No  . Sexual Activity: Not Currently   Other Topics Concern  . Not on file   Social History Narrative    Review of Systems: See HPI    Objective:  There were no vitals filed for this  visit.  Physical Exam  Cardiovascular: Normal rate, regular rhythm and normal heart sounds.   Pulmonary/Chest: Effort normal and breath sounds normal.  Skin:  Two abscess that appear softer and smaller than the week before     Lab Results  Component Value Date   WBC 7.3 07/05/2014   HGB 16.0 08/10/2014   HCT 47.0 08/10/2014   MCV 92.2 07/05/2014   PLT 500* 07/05/2014   Lab Results  Component Value Date   CREATININE 0.70 08/10/2014   BUN 13 08/10/2014   NA 139 08/10/2014   K 3.9 08/10/2014   CL 102 08/10/2014   CO2 25 07/18/2014    Lab Results  Component Value Date   HGBA1C 13 11/20/2014   Lipid Panel     Component Value Date/Time   CHOL 104 06/19/2014 1846   TRIG 120 06/19/2014 1846   HDL 18* 06/19/2014 1846   CHOLHDL 5.8 06/19/2014 1846   VLDL 24 06/19/2014 1846   LDLCALC 62 06/19/2014 1846       Assessment and plan:   Eric Shaw was seen today for wound check.  Diagnoses and all orders for this visit:  Abscess of right axilla Continue Bactrim. Does not need further antibiotics. Continue warm compresses and Dial antimicrobial soap.  Type 2 diabetes mellitus with hyperglycemia Orders: -     Glucose (CBG) -     insulin aspart (novoLOG) injection 20 Units; Inject 0.2 mLs (20 Units total) into the skin once. -     Urinalysis Dipstick -     Glucose (CBG) He will need to bring sugar log back in 2 weeks for the nurse to review       Lance Bosch, [facility] [phone] 11/24/2014, 5:56 PM

## 2014-12-07 ENCOUNTER — Encounter: Payer: Self-pay | Admitting: Pharmacist

## 2015-01-10 ENCOUNTER — Encounter (HOSPITAL_BASED_OUTPATIENT_CLINIC_OR_DEPARTMENT_OTHER): Payer: Self-pay | Admitting: Clinical

## 2015-01-10 ENCOUNTER — Encounter: Payer: Self-pay | Admitting: Internal Medicine

## 2015-01-10 ENCOUNTER — Ambulatory Visit: Payer: Medicare Other | Attending: Internal Medicine | Admitting: Internal Medicine

## 2015-01-10 VITALS — BP 127/85 | HR 100 | Temp 98.9°F | Resp 16 | Ht 70.0 in | Wt 218.6 lb

## 2015-01-10 DIAGNOSIS — Z720 Tobacco use: Secondary | ICD-10-CM | POA: Insufficient documentation

## 2015-01-10 DIAGNOSIS — E119 Type 2 diabetes mellitus without complications: Secondary | ICD-10-CM | POA: Diagnosis not present

## 2015-01-10 DIAGNOSIS — L732 Hidradenitis suppurativa: Secondary | ICD-10-CM | POA: Diagnosis not present

## 2015-01-10 DIAGNOSIS — I10 Essential (primary) hypertension: Secondary | ICD-10-CM

## 2015-01-10 DIAGNOSIS — F329 Major depressive disorder, single episode, unspecified: Secondary | ICD-10-CM

## 2015-01-10 DIAGNOSIS — Z794 Long term (current) use of insulin: Secondary | ICD-10-CM | POA: Insufficient documentation

## 2015-01-10 DIAGNOSIS — F32A Depression, unspecified: Secondary | ICD-10-CM

## 2015-01-10 LAB — GLUCOSE, POCT (MANUAL RESULT ENTRY): POC GLUCOSE: 262 mg/dL — AB (ref 70–99)

## 2015-01-10 MED ORDER — SULFAMETHOXAZOLE-TRIMETHOPRIM 800-160 MG PO TABS
1.0000 | ORAL_TABLET | Freq: Two times a day (BID) | ORAL | Status: DC
Start: 2015-01-10 — End: 2015-05-11

## 2015-01-10 MED ORDER — INSULIN ASPART 100 UNIT/ML ~~LOC~~ SOLN: 3.0000 [IU] | Freq: Three times a day (TID) | SUBCUTANEOUS | Status: DC

## 2015-01-10 MED ORDER — METFORMIN HCL 1000 MG PO TABS: 1000.0000 mg | ORAL_TABLET | Freq: Two times a day (BID) | ORAL | Status: DC

## 2015-01-10 MED ORDER — ATENOLOL 50 MG PO TABS: 50.0000 mg | ORAL_TABLET | Freq: Every day | ORAL | Status: AC

## 2015-01-10 MED ORDER — LISINOPRIL 10 MG PO TABS: 10.0000 mg | ORAL_TABLET | Freq: Every day | ORAL | Status: DC

## 2015-01-10 MED ORDER — INSULIN DETEMIR 100 UNIT/ML FLEXPEN: 50.0000 [IU] | PEN_INJECTOR | Freq: Two times a day (BID) | SUBCUTANEOUS | Status: DC

## 2015-01-10 NOTE — Progress Notes (Signed)
Patient ID: Eric Shaw, male   DOB: [birth year], 54 y.o.   MRN: [id number]  CC: Abscess formation  HPI: Eric Shaw is a 54 y.o. male here today for a follow up visit.  Patient has past medical history of hypertension, diabetes, sleep apnea, bipolar disorder, necrotizing fasciitis. Patient reports over the past 3 days he is noticed left axilla tenderness with palpitation. Patient reports that he feels a abscess forming. Patient reports he has a history of MRSA and feels like he requires antibiotic. Patient admits to not taking insulin in past 3 weeks because he felt like he no longer wanted to care for himself. Patient reports that he has no thoughts of suicide that feels like if he stopped care for himself eventually he would not wake up.  Patient also admits to being off Abilify for several months as he has not scheduled a follow-up appointment with [facility].   No Known Allergies Past Medical History  Diagnosis Date  . Plantar fasciitis, bilateral   . Hypertension   . High cholesterol   . Type II diabetes mellitus   . Arthritis     "all 10 toes" (06/19/2014)  . Depression   . Sleep apnea     "not suppose to wear mask" (06/19/2014)-does not have a cpap-  . Necrotizing fasciitis    Current Outpatient Prescriptions on File Prior to Visit  Medication Sig Dispense Refill  . ARIPiprazole (ABILIFY) 5 MG tablet Take 5 mg by mouth every morning.    . Blood Glucose Monitoring Suppl (MEIJER TRUERESULT GLUCOSE SYS) W/DEVICE KIT Check sugars 4 times a day 1 kit 0  . cephALEXin (KEFLEX) 500 MG capsule Take 1 capsule (500 mg total) by mouth 4 (four) times daily. 40 capsule 0  . DULoxetine (CYMBALTA) 30 MG capsule Take 90 mg by mouth every morning.     . feeding supplement, GLUCERNA SHAKE, (GLUCERNA SHAKE) LIQD Take 237 mLs by mouth 2 (two) times daily between meals. (Patient not taking: Reported on 08/11/2014)  0  . gabapentin (NEURONTIN) 300 MG capsule Take 1,200 mg by mouth 2 (two) times daily.      Marland Kitchen glucose blood (AGAMATRIX PRESTO TEST) test strip Use as instructed 100 each 11  . Insulin Syringes, Disposable, U-100 1 ML MISC Use Novolog 3 times per day 100 each 5  . Lancets (ACCU-CHEK MULTICLIX) lancets Use as instructed 100 each 12  . oxyCODONE-acetaminophen (PERCOCET/ROXICET) 5-325 MG per tablet Take 1-2 tablets by mouth every 4 (four) hours as needed for moderate pain or severe pain. (Patient not taking: Reported on 11/20/2014) 10 tablet 0   No current facility-administered medications on file prior to visit.   Family History  Problem Relation Age of Onset  . Family history unknown: Yes   Social History   Social History  . Marital Status: Single    Spouse Name: N/A  . Number of Children: N/A  . Years of Education: N/A   Occupational History  . Not on file.   Social History Main Topics  . Smoking status: Current Every Day Smoker -- 0.50 packs/day for 36 years    Types: Cigarettes  . Smokeless tobacco: Never Used  . Alcohol Use: No     Comment: Occasionally; has not had any etoh in 8 months  . Drug Use: No  . Sexual Activity: Not Currently   Other Topics Concern  . Not on file   Social History Narrative    Review of Systems: Other than what is stated in HPI, all  other systems are negative.   Objective:   Filed Vitals:   01/10/15 1613  BP: 127/85  Pulse: 100  Temp: 98.9 F (37.2 C)  Resp: 16    Physical Exam  Constitutional: He is oriented to person, place, and time.  Cardiovascular: Normal rate, regular rhythm and normal heart sounds.   Pulmonary/Chest: Effort normal and breath sounds normal.  Neurological: He is alert and oriented to person, place, and time.  Skin: Skin is warm and dry.  Nondraining abscess formation in left axilla, slightly tender to touch. Warm without erythema  Psychiatric:  Patient appears depressed     Lab Results  Component Value Date   WBC 7.3 07/05/2014   HGB 16.0 08/10/2014   HCT 47.0 08/10/2014   MCV 92.2  07/05/2014   PLT 500* 07/05/2014   Lab Results  Component Value Date   CREATININE 0.70 08/10/2014   BUN 13 08/10/2014   NA 139 08/10/2014   K 3.9 08/10/2014   CL 102 08/10/2014   CO2 25 07/18/2014    Lab Results  Component Value Date   HGBA1C 13 11/20/2014   Lipid Panel     Component Value Date/Time   CHOL 104 06/19/2014 1846   TRIG 120 06/19/2014 1846   HDL 18* 06/19/2014 1846   CHOLHDL 5.8 06/19/2014 1846   VLDL 24 06/19/2014 1846   LDLCALC 62 06/19/2014 1846       Assessment and plan:   Torell was seen today for rash.  Diagnoses and all orders for this visit:  Hidradenitis suppurativa -     Begin sulfamethoxazole-trimethoprim (BACTRIM DS,SEPTRA DS) 800-160 MG per tablet; Take 1 tablet by mouth 2 (two) times daily. Patient may use warm compresses and cleans area with Dial antimicrobials soap.  Type 2 diabetes mellitus without complication -     Glucose (CBG) -     insulin aspart (NOVOLOG) 100 UNIT/ML injection; Inject 3-20 Units into the skin 3 (three) times daily before meals. Per sliding scale. -    Refill metFORMIN (GLUCOPHAGE) 1000 MG tablet; Take 1 tablet (1,000 mg total) by mouth 2 (two) times daily with a meal. -     Refill Insulin Detemir (LEVEMIR) 100 UNIT/ML Pen; Inject 50 Units into the skin 2 (two) times daily. Patients diabetes remains uncontrolled as evidence by hemoglobin a1c >8.  Patient has been non-compliant with medication regimen. Stressed the multiple complications associated with uncontrolled diabetes.  Patient will stay on current medication dose and report back to clinic with cbg log in 2 weeks.  Essential hypertension -     atenolol (TENORMIN) 50 MG tablet; Take 1 tablet (50 mg total) by mouth daily. Patient blood pressure is stable and may continue on current medication.  Education on diet, exercise, and modifiable risk factors discussed. Will obtain appropriate labs as needed. Will follow up in 3-6 months.   Bipolar depression I have highly  encouraged patient to make appointment with [facility] for further management of his bipolar depression. Have explained to patient that his move may improve with the use of medication. I referred patient to clinical social worker Roselyn Reef who will follow patient today and next week to make sure he has made appointment for psychiatry. Patient is not suicidal and I feel comfortable discharging patient. Return precautions addressed, patient verbalized understanding    Return for 2 weeks and 4 weeks RN-log review and 3 mo PCP DM.       Lance Bosch, [facility] [phone] 01/10/2015, 6:53 PM

## 2015-01-10 NOTE — Progress Notes (Signed)
Patient complains of having white pimple like rash under his left arm The areas are not oozing any discharge at this time Patient is concerned because he had a similar rash under his right arm And it wound up being a staph infection

## 2015-01-10 NOTE — Progress Notes (Signed)
ASSESSMENT: Pt currently experiencing symptoms of depression. He needs to f/u with PCP and re-establish BH medication management; would benefit from psychoeducation and supportive counseling regarding coping with symptoms of depression and medication compliance. Stage of Change: contemplative  PLAN: 1. F/U with behavioral health consultant in 2 weeks 2. Psychiatric Medications: Neurontin (will go to [facility] for additional BH meds, at least Abilify and Cymbalta) 3. Behavioral recommendation(s):   -Go to [facility] to re-establish [facility] medication management -Consider reading educational material regarding coping with symptoms of depression SUBJECTIVE: Pt. referred by [facility] for symptoms of depression and refer to [facility] medication management:  Pt. reports the following symptoms/concerns: Pt says he knows he needs to be back on meds for depression, and cannot remember all the [facility] meds he has been on in the past, but that he needs to get used to one at a time, and will go to [place]; has transportation to get to their walk-in clinic.  Duration of problem: up to 9 months Severity: moderate  OBJECTIVE: Orientation & Cognition: Oriented x3. Thought processes normal and appropriate to situation. Mood: appropriate. Affect: appropriate Appearance: appropriate Risk of harm to self or others: no risk of harm to self or others Substance use: tobacco, alcohol Assessments administered: none  Diagnosis: Depression CPT Code: F32.9 -------------------------------------------- Other(s) present in the room: none  Time spent with patient in exam room: 16 minutes

## 2015-01-10 NOTE — Patient Instructions (Signed)
Please call to make your follow up appointment with [facility]

## 2015-01-31 ENCOUNTER — Ambulatory Visit (HOSPITAL_BASED_OUTPATIENT_CLINIC_OR_DEPARTMENT_OTHER): Payer: Self-pay | Admitting: Clinical

## 2015-01-31 ENCOUNTER — Ambulatory Visit: Payer: Medicare Other | Attending: Internal Medicine | Admitting: Pharmacist

## 2015-01-31 ENCOUNTER — Ambulatory Visit (HOSPITAL_BASED_OUTPATIENT_CLINIC_OR_DEPARTMENT_OTHER): Payer: Medicare Other | Admitting: Physician Assistant

## 2015-01-31 VITALS — BP 106/72 | HR 96 | Temp 98.0°F | Resp 18 | Ht 70.0 in | Wt 221.0 lb

## 2015-01-31 DIAGNOSIS — B349 Viral infection, unspecified: Secondary | ICD-10-CM

## 2015-01-31 DIAGNOSIS — F32A Depression, unspecified: Secondary | ICD-10-CM

## 2015-01-31 DIAGNOSIS — E119 Type 2 diabetes mellitus without complications: Secondary | ICD-10-CM | POA: Insufficient documentation

## 2015-01-31 DIAGNOSIS — Z79899 Other long term (current) drug therapy: Secondary | ICD-10-CM | POA: Insufficient documentation

## 2015-01-31 DIAGNOSIS — I10 Essential (primary) hypertension: Secondary | ICD-10-CM | POA: Insufficient documentation

## 2015-01-31 DIAGNOSIS — F319 Bipolar disorder, unspecified: Secondary | ICD-10-CM | POA: Insufficient documentation

## 2015-01-31 DIAGNOSIS — F329 Major depressive disorder, single episode, unspecified: Secondary | ICD-10-CM

## 2015-01-31 DIAGNOSIS — G4733 Obstructive sleep apnea (adult) (pediatric): Secondary | ICD-10-CM | POA: Insufficient documentation

## 2015-01-31 LAB — POCT CBG (FASTING - GLUCOSE)-MANUAL ENTRY: Glucose Fasting, POC: 199 mg/dL — AB (ref 70–99)

## 2015-01-31 MED ORDER — GABAPENTIN 300 MG PO CAPS: 1200.0000 mg | ORAL_CAPSULE | Freq: Two times a day (BID) | ORAL | Status: DC

## 2015-01-31 MED ORDER — DULOXETINE HCL 30 MG PO CPEP: 90.0000 mg | ORAL_CAPSULE | Freq: Every morning | ORAL | Status: AC

## 2015-01-31 MED ORDER — ARIPIPRAZOLE 5 MG PO TABS: 5.0000 mg | ORAL_TABLET | Freq: Every morning | ORAL | Status: AC

## 2015-01-31 NOTE — Progress Notes (Signed)
Patient here for not feeling well today, patient is moving slow. Felt like he was sweating but was cold and dizzy earlier today. Patient denies pain at this time. Patient has history of staph aureus infection. Patient saw Misty Stanley here in [facility] for DM today.   Patient is out of gabapentin, cymbalta, abilify.

## 2015-01-31 NOTE — Patient Instructions (Signed)
Please schedule an appointment with me for diabetes follow up when you feel better

## 2015-01-31 NOTE — Progress Notes (Signed)
S:    Patient arrives in a pleasant mood however, is very unstable walking and is complaining of chills, mild sweating, and overall feeling ill.  Presents for diabetes management  Patient reports adherence with medications.  Patient denies hypoglycemic events.  O:  Lab Results  Component Value Date   HGBA1C 13 11/20/2014   POCT glucose = 199 (fasting)  A/P: Diabetes: Patient reports feeling ill on arrival to clinic. Complains of chills, sweating, and overall feeling ill. Patient has history of recent staph aureus infection. Concern for reinfection given current symptoms. Will schedule patient for walk-in clinic today. Defer further diabetes management to next visit - patient to reschedule for visit with pharmacy clinic once he feels better.   Juanita Craver, PharmD, BCPS, CPP Clinical Pharmacist Practitioner  [facility] [phone]

## 2015-01-31 NOTE — Progress Notes (Signed)
ASSESSMENT: Pt currently experiencing symptoms of depression; pt needs to f/u with PCP and establish BH med management, he would benefit from continuing supportive counseling regarding coping with symptoms of depression.  Stage of Change: contemplative  PLAN: 1. F/U with behavioral health consultant in as needed 2. Psychiatric Medications: abilify, cymbalta, neurontin. 3. Behavioral recommendation(s):   -Go to [facility] in [place] for [facility] med management  SUBJECTIVE: Pt. referred by Scot Jun for symptoms of anxiety and depression:  Pt. reports the following symptoms/concerns: Pt states that he has been denied Medicaid because he does not have disability, that he has a disability hearing on February 28, 2015, but that his attorney dropped him as a client because he could not be contacted. Pt says he was unaware that attorneys are saying he is disabled "because I'm nuts", when he originally thought it was because of his health issues, says he has had 5 infections in one year, including MRSA. He keeps to himself and copes by watching interesting shows and doing historical research. He was previously working with a male therapist he liked at [facility], who was working with him on his communication skills with women, but does not think she is still at [facility] and wants another alternative, wants a new psychiatrist. Duration of problem: at least 10 months Severity: moderate  OBJECTIVE: Orientation & Cognition: Oriented x3. Thought processes normal and appropriate to situation. Mood: appropriate. Affect: appropriate Appearance: appropriate Risk of harm to self or others: no harm to self or others Substance use: none Assessments administered: PHQ9: 9/ GAD7: 6  Diagnosis: Depression CPT Code: F32.9 -------------------------------------------- Other(s) present in the room: none  Time spent with patient in exam room: 20 minutes

## 2015-01-31 NOTE — Progress Notes (Signed)
Chief Complaint: "I just didn't feel right"  Subjective: This is a 54 year old male with a history of diabetes mellitus type 2, hypertension, obstructive sleep apnea and bipolar disorder who is here for diabetes management without clinical pharmacist and reported that he been expansion 7 onset of chills, some bloody, and urine feeling. He states he woke up this morning and felt fine. About 30 minutes prior to arrival he suddenly became tired, lethargic, and had decreased balance. He felt a little cool and clammy. He's been hydrating for the last hour and already feels better.  He also states that he needs refills on his Abilify, Gabapentin, and Cymbalta.   ROS:  GEN: denies fever or chills, denies change in weight Skin: denies lesions or rashes HEENT: denies headache, earache, epistaxis, sore throat, or neck pain LUNGS: denies SHOB, dyspnea, PND, orthopnea CV: denies CP or palpitations ABD: denies abd pain, N or V EXT: denies muscle spasms or swelling; no pain in lower ext, no weakness NEURO: denies numbness or tingling, denies sz, stroke or TIA   Objective:  Filed Vitals:   01/31/15 1109  BP: 106/72  Pulse: 96  Temp: 98 F (36.7 C)  TempSrc: Oral  Resp: 18  Height: _0  (1.778 m)  Weight: 221 lb (100.245 kg)  SpO2: 97%    Physical Exam:  General: in no acute distress. HEENT: no pallor, no icterus, moist oral mucosa, no JVD, no lymphadenopathy. Injected left ear. No fluid.  Heart: Normal  s1 &s2  Regular rate and rhythm, without murmurs, rubs, gallops. Lungs: Clear to auscultation bilaterally.. Neuro: Alert, awake, oriented x3, nonfocal.    Medications: Prior to Admission medications   Medication Sig Start Date End Date Taking? Authorizing Provider  atenolol (TENORMIN) 50 MG tablet Take 1 tablet (50 mg total) by mouth daily. 01/10/15  Yes Lance Bosch, NP  Blood Glucose Monitoring Suppl [facility] TRUERESULT GLUCOSE SYS) W/DEVICE KIT Check sugars 4 times a day 07/14/14   Yes Micheline Chapman, NP  glucose blood (AGAMATRIX PRESTO TEST) test strip Use as instructed 07/14/14  Yes Micheline Chapman, NP  insulin aspart (NOVOLOG) 100 UNIT/ML injection Inject 3-20 Units into the skin 3 (three) times daily before meals. Per sliding scale. 01/10/15  Yes Lance Bosch, NP  Insulin Detemir (LEVEMIR) 100 UNIT/ML Pen Inject 50 Units into the skin 2 (two) times daily. 01/10/15  Yes Lance Bosch, NP  Insulin Syringes, Disposable, U-100 1 ML MISC Use Novolog 3 times per day 08/11/14  Yes Lance Bosch, NP  Lancets (ACCU-CHEK MULTICLIX) lancets Use as instructed 07/14/14  Yes Micheline Chapman, NP  lisinopril (PRINIVIL,ZESTRIL) 10 MG tablet Take 1 tablet (10 mg total) by mouth daily. For hypertension and renal protection. 01/10/15  Yes Lance Bosch, NP  metFORMIN (GLUCOPHAGE) 1000 MG tablet Take 1 tablet (1,000 mg total) by mouth 2 (two) times daily with a meal. 01/10/15  Yes Lance Bosch, NP  ARIPiprazole (ABILIFY) 5 MG tablet Take 1 tablet (5 mg total) by mouth every morning. 01/31/15   Tiffany Daneil Dan, PA-C  cephALEXin (KEFLEX) 500 MG capsule Take 1 capsule (500 mg total) by mouth 4 (four) times daily. Patient not taking: Reported on 01/31/2015 11/17/14   Fransico Meadow, PA-C  DULoxetine (CYMBALTA) 30 MG capsule Take 3 capsules (90 mg total) by mouth every morning. 01/31/15   Brayton Caves, PA-C  feeding supplement, GLUCERNA SHAKE, (GLUCERNA SHAKE) LIQD Take 237 mLs by mouth 2 (two) times daily between meals.  Patient not taking: Reported on 08/11/2014 07/06/14   Hosie Poisson, MD  gabapentin (NEURONTIN) 300 MG capsule Take 4 capsules (1,200 mg total) by mouth 2 (two) times daily. 01/31/15   Tiffany Daneil Dan, PA-C  sulfamethoxazole-trimethoprim (BACTRIM DS,SEPTRA DS) 800-160 MG per tablet Take 1 tablet by mouth 2 (two) times daily. Patient not taking: Reported on 01/31/2015 01/10/15   Lance Bosch, NP    Assessment: 1. Viral Syndrome 2. ? Left OM 3. Bipolar d/o  Plan: I offered antibiotic  coverage for his ear and potential illness. He states that he's been on multiple antibiotics in the past and feels that this will "blow over". He would like to see how he does over the next few days and will call us back if he feels like he needs an antibiotic at that time. He feels that the last hour of hydrating has already improved his symptoms. I gave him a one-month supply of refill on his Abilify, gabapentin and Cymbalta. He is encouraged to continue to see the physician at [facility]. He was also seen by LCSW this visit.  Follow [id number] for routine health maintenance  The patient was given clear instructions to go to ER or return to medical center if symptoms don't improve, worsen or new problems develop. The patient verbalized understanding. The patient was told to call to get lab results if they haven't heard anything in the next week.   This note has been created with Surveyor, quantity. Any transcriptional errors are unintentional.   Zettie Pho, PA-C 01/31/2015, 12:04 PM

## 2015-05-11 ENCOUNTER — Ambulatory Visit: Payer: Medicaid Other | Attending: Internal Medicine | Admitting: Internal Medicine

## 2015-05-11 ENCOUNTER — Encounter: Payer: Self-pay | Admitting: Internal Medicine

## 2015-05-11 VITALS — BP 129/78 | HR 81 | Temp 98.0°F | Resp 16 | Ht 70.0 in | Wt 223.6 lb

## 2015-05-11 DIAGNOSIS — E119 Type 2 diabetes mellitus without complications: Secondary | ICD-10-CM

## 2015-05-11 DIAGNOSIS — L732 Hidradenitis suppurativa: Secondary | ICD-10-CM | POA: Diagnosis not present

## 2015-05-11 DIAGNOSIS — Z79899 Other long term (current) drug therapy: Secondary | ICD-10-CM | POA: Diagnosis not present

## 2015-05-11 DIAGNOSIS — Z794 Long term (current) use of insulin: Secondary | ICD-10-CM | POA: Diagnosis not present

## 2015-05-11 DIAGNOSIS — Z7984 Long term (current) use of oral hypoglycemic drugs: Secondary | ICD-10-CM | POA: Insufficient documentation

## 2015-05-11 LAB — POCT GLYCOSYLATED HEMOGLOBIN (HGB A1C): HEMOGLOBIN A1C: 11.3

## 2015-05-11 LAB — GLUCOSE, POCT (MANUAL RESULT ENTRY): POC Glucose: 282 mg/dl — AB (ref 70–99)

## 2015-05-11 MED ORDER — SULFAMETHOXAZOLE-TRIMETHOPRIM 800-160 MG PO TABS: 1.0000 | ORAL_TABLET | Freq: Two times a day (BID) | ORAL | Status: DC

## 2015-05-11 MED ORDER — CANAGLIFLOZIN 100 MG PO TABS: 100.0000 mg | ORAL_TABLET | Freq: Every day | ORAL | Status: DC

## 2015-05-11 MED FILL — SULFAMETHOXAZOLE/TMP DS TAB: 800-160 | 10 days supply | Qty: 20 | Fill #0

## 2015-05-11 MED FILL — INVOKANA 100 MG TABLET: 100 | 30 days supply | Qty: 30 | Fill #0

## 2015-05-11 NOTE — Progress Notes (Signed)
Patient here for follow up on his diabetes Patient also complains of having an infection under his right arm for  About a week Patient stated this infection is a common thing for him

## 2015-05-11 NOTE — Patient Instructions (Signed)
Go to pharmacy to get the Invokana. We will see you in 2 weeks.

## 2015-05-11 NOTE — Progress Notes (Signed)
Patient ID: Eric Shaw, male   DOB: [birth year], 55 y.o.   MRN: [id number] SUBJECTIVE: 55 y.o. male for follow up of diabetes. Diabetic Review of Systems - medication compliance: noncompliant much of the time, diabetic diet compliance: noncompliant much of the time, home glucose monitoring: is performed sporadically, only once per day, further diabetic ROS: no polyuria or polydipsia, no chest pain, dyspnea or TIA's, no hypoglycemia, has dysesthesias in the feet.  Patient reports that he usually only eats dinner daily. No appetite for breakfast and dinner. He also states that he only remembers to take insulin in the morning. Admits to skipping doses at least 3 times per week.  Now has repeat abscess under right axilla that has been present for one week. States that it is very painful but has not begun to drain. He has started using warm compresses which has helped some with tenderness. No fevers, chills, or swelling.   Current Outpatient Prescriptions  Medication Sig Dispense Refill  . ARIPiprazole (ABILIFY) 5 MG tablet Take 1 tablet (5 mg total) by mouth every morning. 30 tablet 0  . atenolol (TENORMIN) 50 MG tablet Take 1 tablet (50 mg total) by mouth daily. 30 tablet 4  . DULoxetine (CYMBALTA) 30 MG capsule Take 3 capsules (90 mg total) by mouth every morning. 30 capsule 0  . gabapentin (NEURONTIN) 300 MG capsule Take 4 capsules (1,200 mg total) by mouth 2 (two) times daily. 60 capsule 0  . insulin aspart (NOVOLOG) 100 UNIT/ML injection Inject 3-20 Units into the skin 3 (three) times daily before meals. Per sliding scale. 3 vial 11  . Insulin Detemir (LEVEMIR) 100 UNIT/ML Pen Inject 50 Units into the skin 2 (two) times daily. 15 mL 11  . lisinopril (PRINIVIL,ZESTRIL) 10 MG tablet Take 1 tablet (10 mg total) by mouth daily. For hypertension and renal protection. 30 tablet 5  . metFORMIN (GLUCOPHAGE) 1000 MG tablet Take 1 tablet (1,000 mg total) by mouth 2 (two) times daily with a meal. 60 tablet 5   . Blood Glucose Monitoring Suppl (MEIJER TRUERESULT GLUCOSE SYS) W/DEVICE KIT Check sugars 4 times a day 1 kit 0  . cephALEXin (KEFLEX) 500 MG capsule Take 1 capsule (500 mg total) by mouth 4 (four) times daily. (Patient not taking: Reported on 01/31/2015) 40 capsule 0  . feeding supplement, GLUCERNA SHAKE, (GLUCERNA SHAKE) LIQD Take 237 mLs by mouth 2 (two) times daily between meals. (Patient not taking: Reported on 08/11/2014)  0  . glucose blood (AGAMATRIX PRESTO TEST) test strip Use as instructed 100 each 11  . Insulin Syringes, Disposable, U-100 1 ML MISC Use Novolog 3 times per day 100 each 5  . Lancets (ACCU-CHEK MULTICLIX) lancets Use as instructed 100 each 12  . sulfamethoxazole-trimethoprim (BACTRIM DS,SEPTRA DS) 800-160 MG per tablet Take 1 tablet by mouth 2 (two) times daily. (Patient not taking: Reported on 01/31/2015) 20 tablet 0   No current facility-administered medications for this visit.    OBJECTIVE: Appearance: alert, well appearing, and in no distress, oriented to person, place, and time and overweight. BP 129/78 mmHg  Pulse 81  Temp(Src) 98 F (36.7 C)  Resp 16  Ht 5' 10"  (1.778 m)  Wt 223 lb 9.6 oz (101.424 kg)  BMI 32.08 kg/m2  SpO2 100%  Physical Exam  Constitutional: He is oriented to person, place, and time.  Cardiovascular: Normal rate, regular rhythm and normal heart sounds.   Pulses:      Dorsalis pedis pulses are 2+ on the right side,  and 2+ on the left side.       Posterior tibial pulses are 2+ on the right side, and 2+ on the left side.  Pulmonary/Chest: Effort normal and breath sounds normal.  Musculoskeletal: He exhibits no edema.  Feet:  Right Foot:  Protective Sensation: 10 sites tested.2 sites sensed. Skin Integrity: Negative for skin breakdown.  Left Foot:  Protective Sensation: 10 sites tested. 0 sites sensed. Skin Integrity: Negative for skin breakdown.  Neurological: He is alert and oriented to person, place, and time.  Psychiatric: He  has a normal mood and affect.     ASSESSMENT: Eric Shaw was seen today for follow-up.  Diagnoses and all orders for this visit:  Type 2 diabetes mellitus without complication, without long-term current use of insulin (HCC) -     Glucose (CBG) -     HgB A1c -     Begin canagliflozin (INVOKANA) 100 MG TABS tablet; Take 1 tablet (100 mg total) by mouth daily before breakfast. I will not make changes to insulin at this time because he admits to non-compliance. I will add Invokana---hopefully he is approved for patient assistance to obtain medication. Hopefully a once per day oral will help loser his A1C in conjunction with better insulin adherence.   Hidradenitis suppurativa -     Begin sulfamethoxazole-trimethoprim (BACTRIM DS,SEPTRA DS) 800-160 MG tablet; Take 1 tablet by mouth 2 (two) times daily.  PLAN: See orders for this visit as documented in the electronic medical record. Issues reviewed with him: diabetic diet discussed in detail, written exchange diet given, low cholesterol diet, weight control and daily exercise discussed, all medications, side effects and compliance discussed carefully, foot care discussed and Podiatry visits discussed, annual eye examinations at [facility] discussed and long term diabetic complications discussed.   Return in about 2 days (around 05/13/2015) for Nurse Visit-log review and 3 mo PCP .   Lance Bosch, NP 05/15/2015 2:34 PM

## 2015-05-23 ENCOUNTER — Encounter: Payer: Self-pay | Admitting: Pharmacist

## 2015-05-29 ENCOUNTER — Ambulatory Visit: Payer: Medicaid Other | Attending: Family Medicine | Admitting: Pharmacist

## 2015-05-29 DIAGNOSIS — Z794 Long term (current) use of insulin: Secondary | ICD-10-CM | POA: Insufficient documentation

## 2015-05-29 DIAGNOSIS — E119 Type 2 diabetes mellitus without complications: Secondary | ICD-10-CM

## 2015-05-29 MED ORDER — GABAPENTIN 300 MG PO CAPS: 1200.0000 mg | ORAL_CAPSULE | Freq: Two times a day (BID) | ORAL | Status: DC

## 2015-05-29 NOTE — Patient Instructions (Signed)
Thanks for coming to see me!  Start getting some blood sugars before you eat dinner. I need to see more readings to adjust your medications.  Come back and see me in 2 weeks!

## 2015-05-29 NOTE — Progress Notes (Signed)
S:    Patient arrives in good spirits.  Presents for diabetes follow up.  Patient reports adherence with medications. Current diabetes medications include Novolog sliding scale (he only uses it once daily with dinner and just guesses the dose), Levemir 50 units BID, metformin 1000 mg BID, and Invokana 100 mg daily.   Patient denies hypoglycemic events.  Patient reported dietary habits: knows he needs to eat better and drink more water. Only really eats dinner though and skips breakfast and lunch.  Patient reported exercise habits: knows he needs to exercise but the neuropathy is really bad as he has been out of gabapentin.    Patient denies nocturia.  Patient reports neuropathy. Patient reports chronic visual changes - he is saving money for new glasses. Patient reports self foot exams.    O:  Lab Results  Component Value Date   HGBA1C 11.30 05/11/2015    Home fasting CBG: 180s-190s since starting Invokana 2 hour post-prandial/random CBG: does not check.  A/P: Diabetes currently uncontrolled based on A1c of 11.3 but under improved control based on home fastings.   Patient denies hypoglycemic events and is able to verbalize appropriate hypoglycemia management plan.  Patient reports adherence with medication. Control is suboptimal due to sedentary lifestyle.  Continued basal insulin Levemir (insulin determir) at 50 units BID. Continued rapid insulin Novolog (insulin aspart) at sliding scale. Continue metformin 1000 mg BID and Invokana 100 mg daily. I need post-prandial and random readings to be able to adjust medications but I am not sure that he is giving himself enough Novolog since he is just guessing the dose and not basing it off of a blood glucose reading. Patient will monitor blood glucose at dinner time and bring his meter or a log book back in 2 weeks. Refilled gabapentin for neuropathy as I am hopeful if his pain improves, he will be able to exercise.   Next A1C anticipated  April 2017.    Written patient instructions provided.  Total time in face to face counseling 20 minutes.  Follow up in Pharmacist Clinic Visit in 2 weeks.

## 2015-06-12 ENCOUNTER — Ambulatory Visit: Payer: Self-pay | Admitting: Pharmacist

## 2015-06-15 MED FILL — GABAPENTIN 300 MG CAPSULE: 300 | 30 days supply | Qty: 240 | Fill #0

## 2015-06-15 MED FILL — metFORMIN HCL 1000 MG TABS: 1000 | 30 days supply | Qty: 60 | Fill #3

## 2015-07-17 ENCOUNTER — Encounter (HOSPITAL_COMMUNITY): Payer: Self-pay | Admitting: Emergency Medicine

## 2015-07-17 ENCOUNTER — Other Ambulatory Visit (HOSPITAL_COMMUNITY)
Admission: RE | Admit: 2015-07-17 | Discharge: 2015-07-17 | Disposition: A | Discharge status: Home / Self Care | Payer: Medicaid Other | Source: Ambulatory Visit | Attending: Family Medicine | Admitting: Family Medicine

## 2015-07-17 ENCOUNTER — Emergency Department (HOSPITAL_COMMUNITY)
Admission: EM | Admit: 2015-07-17 | Discharge: 2015-07-17 | Disposition: A | Discharge status: Home / Self Care | Payer: Medicare Other | Source: Home / Self Care | Attending: Family Medicine | Admitting: Family Medicine

## 2015-07-17 DIAGNOSIS — L0201 Cutaneous abscess of face: Secondary | ICD-10-CM | POA: Insufficient documentation

## 2015-07-17 MED ORDER — CLINDAMYCIN HCL 300 MG PO CAPS: 300.0000 mg | ORAL_CAPSULE | Freq: Three times a day (TID) | ORAL | Status: DC

## 2015-07-17 NOTE — ED Notes (Signed)
Patient has a swollen, warm, red area to the right side of his face.  Patient reports this is an abscess, which he has had many.  This abscess present for 4 days.

## 2015-07-17 NOTE — ED Provider Notes (Signed)
CSN: [id number]     Arrival date & time 07/17/15  1659 History   First MD Initiated Contact with Patient 07/17/15 1935     Chief Complaint  Patient presents with  . Abscess   (Consider location/radiation/quality/duration/timing/severity/associated sxs/prior Treatment) HPI Comments: 55 year old male presents with a tender, painful area of swelling to the right side of the face approximately half centimeters anterior to the ear. It is in the preauricular space. This is a annular mounded lesion. Underlying induration, tenderness. No erythema or lymphangitis. Well marginated. He states he has a history of several of these abscesses as well as infected lymph nodes and other cysts. He has type 2 diabetes mellitus and peripheral neuropathy, plantar fasciitis.   Past Medical History  Diagnosis Date  . Plantar fasciitis, bilateral   . Hypertension   . High cholesterol   . Type II diabetes mellitus ([place])   . Arthritis     "all 10 toes" (06/19/2014)  . Depression   . Sleep apnea     "not suppose to wear mask" (06/19/2014)-does not have a cpap-  . Necrotizing fasciitis [facility])    Past Surgical History  Procedure Laterality Date  . Reduction of torsion of testis Left 1979  . Incision and drainage abscess N/A 06/24/2014    Procedure: INCISION AND DRAINAGE OF POSTERIOR NECK ABCESS;  Surgeon: Izora Gala, MD;  Location: [place];  Service: ENT;  Laterality: N/A;  . Wound debridement N/A 06/24/2014    Procedure: DEBRIDEMENT OF NECROTIZING FASCITIS ON POSTERIOR NECK;  Surgeon: Izora Gala, MD;  Location: [place];  Service: ENT;  Laterality: N/A;  . Incision and drainage abscess N/A 06/26/2014    Procedure: REPEAT DEBRIEDMENT OF NECK  WOUND;  Surgeon: Izora Gala, MD;  Location: [place];  Service: ENT;  Laterality: N/A;  . Incision and drainage abscess N/A 06/28/2014    Procedure:  REPEAT DEBRIEDMENT OF NECK  WOUND;  Surgeon: Izora Gala, MD;  Location: [place];  Service: ENT;  Laterality: N/A;  . Application of  wound vac N/A 07/05/2014    Procedure: APPLICATION OF WOUND VAC;  Surgeon: Izora Gala, MD;  Location: [place];  Service: ENT;  Laterality: N/A;  . Incision and drainage of wound N/A 07/05/2014    Procedure: IRRIGATION AND DEBRIDEMENT WOUND;  Surgeon: Izora Gala, MD;  Location: [place];  Service: ENT;  Laterality: N/A;  . Application of a-cell of head/neck N/A 07/05/2014    Procedure: APPLICATION OF A-CELL OF HEAD/NECK;  Surgeon: Izora Gala, MD;  Location: [place];  Service: ENT;  Laterality: N/A;  . Incision and drainage of wound N/A 07/20/2014    Procedure: IRRIGATION AND DEBRIDEMENT OF NECROTIZING FASCIITIS WOUND OF NECK WITH PLACEMENT OF ACELL/VAC;  Surgeon: Theodoro Kos, DO;  Location: [place];  Service: Plastics;  Laterality: N/A;  . Application of a-cell of head/neck N/A 07/20/2014    Procedure: APPLICATION OF A-CELL OF HEAD/NECK;  Surgeon: Theodoro Kos, DO;  Location: [place];  Service: Plastics;  Laterality: N/A;  . Incision and drainage of wound N/A 08/02/2014    Procedure: IRRIGATION AND DEBRIDEMENT OF POSTERIOR NECK WOUND WITH SURGICAL PREP AND PLACEMENT OF A CELL AND VAC;  Surgeon: Theodoro Kos, DO;  Location: [place];  Service: Plastics;  Laterality: N/A;  . Application of a-cell of head/neck N/A 08/02/2014    Procedure: APPLICATION OF A-CELL OF HEAD/NECK;  Surgeon: Theodoro Kos, DO;  Location: [place];  Service: Plastics;  Laterality: N/A;  .  Incision and drainage of wound N/A 08/10/2014    Procedure: IRRIGATION AND DEBRIDEMENT OF POSTERIOR NECK WOUND WITH SURGICAL PREP WITH SPLIT THICKNESS SKIN GRAFT AND PLACEMENT OF VAC;  Surgeon: Theodoro Kos, DO;  Location: [place];  Service: Plastics;  Laterality: N/A;  . Skin split graft N/A 08/10/2014    Procedure: SKIN GRAFT SPLIT THICKNESS;  Surgeon: Theodoro Kos, DO;  Location: [place];  Service: Plastics;  Laterality: N/A;  From Left Posterior  Thigh   Family History  Problem Relation Age of Onset  . Family history unknown: Yes   Social History  Substance Use Topics  . Smoking status: Current Every Day Smoker -- 1.00 packs/day for 36 years    Types: Cigarettes  . Smokeless tobacco: Never Used  . Alcohol Use: No     Comment: No beer since 05/2014    Review of Systems  Constitutional: Negative.   HENT:       Right preauricular lesion as described in history of present illness. Otherwise HEENT  negative  Respiratory: Negative.   Cardiovascular: Negative.   Gastrointestinal: Negative.   Musculoskeletal: Negative.   Psychiatric/Behavioral: Negative.     Allergies  Review of patient's allergies indicates no known allergies.  Home Medications   Prior to Admission medications   Medication Sig Start Date End Date Taking? Authorizing Provider  ARIPiprazole (ABILIFY) 5 MG tablet Take 1 tablet (5 mg total) by mouth every morning. 01/31/15   Tiffany Daneil Dan, PA-C  atenolol (TENORMIN) 50 MG tablet Take 1 tablet (50 mg total) by mouth daily. 01/10/15   Lance Bosch, NP  Blood Glucose Monitoring Suppl [facility] TRUERESULT GLUCOSE SYS) W/DEVICE KIT Check sugars 4 times a day 07/14/14   Micheline Chapman, NP  canagliflozin [facility]) 100 MG TABS tablet Take 1 tablet (100 mg total) by mouth daily before breakfast. 05/11/15   Lance Bosch, NP  clindamycin (CLEOCIN) 300 MG capsule Take 1 capsule (300 mg total) by mouth 3 (three) times daily. 07/17/15   Janne Napoleon, NP  DULoxetine (CYMBALTA) 30 MG capsule Take 3 capsules (90 mg total) by mouth every morning. 01/31/15   Tiffany Daneil Dan, PA-C  gabapentin (NEURONTIN) 300 MG capsule Take 4 capsules (1,200 mg total) by mouth 2 (two) times daily. 05/29/15   Tresa Garter, MD  glucose blood (AGAMATRIX PRESTO TEST) test strip Use as instructed 07/14/14   Micheline Chapman, NP  insulin aspart (NOVOLOG) 100 UNIT/ML injection Inject 3-20 Units into the skin 3 (three) times daily before meals. Per  sliding scale. 01/10/15   Lance Bosch, NP  Insulin Detemir (LEVEMIR) 100 UNIT/ML Pen Inject 50 Units into the skin 2 (two) times daily. 01/10/15   Lance Bosch, NP  Insulin Syringes, Disposable, U-100 1 ML MISC Use Novolog 3 times per day 08/11/14   Lance Bosch, NP  Lancets (ACCU-CHEK MULTICLIX) lancets Use as instructed 07/14/14   Micheline Chapman, NP  lisinopril (PRINIVIL,ZESTRIL) 10 MG tablet Take 1 tablet (10 mg total) by mouth daily. For hypertension and renal protection. 01/10/15   Lance Bosch, NP  metFORMIN (GLUCOPHAGE) 1000 MG tablet Take 1 tablet (1,000 mg total) by mouth 2 (two) times daily with a meal. 01/10/15   Lance Bosch, NP   Meds Ordered and Administered this Visit  Medications - No data to display  BP 167/83 mmHg  Pulse 107  Temp(Src) 97.8 F (36.6 C) (Oral)  Resp 16  SpO2 97% No data found.  Physical Exam  Constitutional: He is oriented to person, place, and time. He appears well-developed and well-nourished. No distress.  HENT:  Tender, firm, indurated raised mounded lesion to the right face approximately 20 cm anterior to the preauricular space. No erythema or lymphangitis.  Neck: Normal range of motion. Neck supple.  Pulmonary/Chest: Effort normal. No respiratory distress.  Neurological: He is alert and oriented to person, place, and time. He exhibits normal muscle tone.  Skin: Skin is warm and dry.  Psychiatric: He has a normal mood and affect.  Nursing note and vitals reviewed.   ED Course  .Marland KitchenIncision and Drainage Date/Time: 07/17/2015 8:33 PM Performed by: Marcha Dutton, Shauntee Karp Authorized by: Ihor Gully D Consent: Verbal consent obtained. Risks and benefits: risks, benefits and alternatives were discussed Consent given by: patient Patient understanding: patient states understanding of the procedure being performed Patient identity confirmed: verbally with patient Type: cyst Body area: head Location details: face Anesthesia: local infiltration Local  anesthetic: lidocaine 2% without epinephrine Anesthetic total: 4 ml Scalpel size: 11 Incision type: single with marsupialization Incision depth: dermal Complexity: simple Drainage: purulent and  bloody Drainage amount: scant Wound treatment: drain placed Packing material: 1/4 in iodoform gauze Patient tolerance: Patient tolerated the procedure well with no immediate complications Comments: The lesion was mostly solid. Initially 2 cm of pus was expressed from the wound followed by blood. Superficial and small area of the wound was marsupialized. The underlying tissue appeared to be more nodular or cystic versus true abscess.   (including critical care time)  Labs Review Labs Reviewed  CULTURE, ROUTINE-ABSCESS    Imaging Review No results found.   Visual Acuity Review  Right Eye Distance:   Left Eye Distance:   Bilateral Distance:    Right Eye Near:   Left Eye Near:    Bilateral Near:         MDM   1. Cutaneous abscess of face    Early abscess versus infected cystic or nodular type lesion. I&D performed. Superficial incision. May change the dressing and remove packing in 24 hours. Keep clean with warm water and soap. Clindamycin as directed. For worsening, new symptoms or problems may return. Wound culture obtained.    Janne Napoleon, NP 07/17/15 2038

## 2015-07-17 NOTE — Discharge Instructions (Signed)
Abscess °An abscess is an infected area that contains a collection of pus and debris. It can occur in almost any part of the body. An abscess is also known as a furuncle or boil. °CAUSES  °An abscess occurs when tissue gets infected. This can occur from blockage of oil or sweat glands, infection of hair follicles, or a minor injury to the skin. As the body tries to fight the infection, pus collects in the area and creates pressure under the skin. This pressure causes pain. People with weakened immune systems have difficulty fighting infections and get certain abscesses more often.  °SYMPTOMS °Usually an abscess develops on the skin and becomes a painful mass that is red, warm, and tender. If the abscess forms under the skin, you may feel a moveable soft area under the skin. Some abscesses break open (rupture) on their own, but most will continue to get worse without care. The infection can spread deeper into the body and eventually into the bloodstream, causing you to feel ill.  °DIAGNOSIS  °Your caregiver will take your medical history and perform a physical exam. A sample of fluid may also be taken from the abscess to determine what is causing your infection. °TREATMENT  °Your caregiver may prescribe antibiotic medicines to fight the infection. However, taking antibiotics alone usually does not cure an abscess. Your caregiver may need to make a small cut (incision) in the abscess to drain the pus. In some cases, gauze is packed into the abscess to reduce pain and to continue draining the area. °HOME CARE INSTRUCTIONS  °· Only take over-the-counter or prescription medicines for pain, discomfort, or fever as directed by your caregiver. °· If you were prescribed antibiotics, take them as directed. Finish them even if you start to feel better. °· If gauze is used, follow your caregiver's directions for changing the gauze. °· To avoid spreading the infection: °· Keep your draining abscess covered with a  bandage. °· Wash your hands well. °· Do not share personal care items, towels, or whirlpools with others. °· Avoid skin contact with others. °· Keep your skin and clothes clean around the abscess. °· Keep all follow-up appointments as directed by your caregiver. °SEEK MEDICAL CARE IF:  °· You have increased pain, swelling, redness, fluid drainage, or bleeding. °· You have muscle aches, chills, or a general ill feeling. °· You have a fever. °MAKE SURE YOU:  °· Understand these instructions. °· Will watch your condition. °· Will get help right away if you are not doing well or get worse. °  °This information is not intended to replace advice given to you by your health care provider. Make sure you discuss any questions you have with your health care provider. °  °Document Released: 01/29/2005 Document Revised: 10/21/2011 Document Reviewed: 07/04/2011 °Elsevier Interactive Patient Education ©2016 [employer] ° °Incision and Drainage °Incision and drainage is a procedure in which a sac-like structure (cystic structure) is opened and drained. The area to be drained usually contains material such as pus, fluid, or blood.  °LET YOUR CAREGIVER KNOW ABOUT:  °· Allergies to medicine. °· Medicines taken, including vitamins, herbs, eyedrops, over-the-counter medicines, and creams. °· Use of steroids (by mouth or creams). °· Previous problems with anesthetics or numbing medicines. °· History of bleeding problems or blood clots. °· Previous surgery. °· Other health problems, including diabetes and kidney problems. °· Possibility of pregnancy, if this applies. °RISKS AND COMPLICATIONS °· Pain. °· Bleeding. °· Scarring. °· Infection. °BEFORE THE PROCEDURE  °  You may need to have an ultrasound or other imaging tests to see how large or deep your cystic structure is. Blood tests may also be used to determine if you have an infection or how severe the infection is. You may need to have a tetanus shot. °PROCEDURE  °The affected area  is cleaned with a cleaning fluid. The cyst area will then be numbed with a medicine (local anesthetic). A small incision will be made in the cystic structure. A syringe or catheter may be used to drain the contents of the cystic structure, or the contents may be squeezed out. The area will then be flushed with a cleansing solution. After cleansing the area, it is often gently packed with a gauze or another wound dressing. Once it is packed, it will be covered with gauze and tape or some other type of wound dressing.  °AFTER THE PROCEDURE  °· Often, you will be allowed to go home right after the procedure. °· You may be given antibiotic medicine to prevent or heal an infection. °· If the area was packed with gauze or some other wound dressing, you will likely need to come back in 1 to 2 days to get it removed. °· The area should heal in about 14 days. °  °This information is not intended to replace advice given to you by your health care provider. Make sure you discuss any questions you have with your health care provider. °  °Document Released: 10/15/2000 Document Revised: 10/21/2011 Document Reviewed: 06/16/2011 °Elsevier Interactive Patient Education ©2016 [employer] ° °

## 2015-07-20 LAB — CULTURE, ROUTINE-ABSCESS

## 2015-07-23 MED FILL — CLINDAMYCIN HCL 300 MG CAP: 300 | 10 days supply | Qty: 30 | Fill #0

## 2015-12-03 ENCOUNTER — Encounter (HOSPITAL_COMMUNITY): Payer: Self-pay | Admitting: Emergency Medicine

## 2015-12-03 ENCOUNTER — Ambulatory Visit (HOSPITAL_COMMUNITY)
Admission: EM | Admit: 2015-12-03 | Discharge: 2015-12-03 | Disposition: A | Discharge status: Home / Self Care | Payer: Medicare Other | Attending: Family Medicine | Admitting: Family Medicine

## 2015-12-03 DIAGNOSIS — L03112 Cellulitis of left axilla: Secondary | ICD-10-CM | POA: Diagnosis not present

## 2015-12-03 DIAGNOSIS — L732 Hidradenitis suppurativa: Secondary | ICD-10-CM

## 2015-12-03 MED ORDER — KETOROLAC TROMETHAMINE 60 MG/2ML IM SOLN
INTRAMUSCULAR | Status: AC
  Filled 2015-12-03: qty 2

## 2015-12-03 MED ORDER — CLINDAMYCIN HCL 300 MG PO CAPS: 300.0000 mg | ORAL_CAPSULE | Freq: Three times a day (TID) | ORAL | 0 refills | Status: DC

## 2015-12-03 MED ORDER — CEFTRIAXONE SODIUM 1 G IJ SOLR
INTRAMUSCULAR | Status: AC
  Filled 2015-12-03: qty 10

## 2015-12-03 MED ORDER — KETOROLAC TROMETHAMINE 60 MG/2ML IM SOLN
60.0000 mg | Freq: Once | INTRAMUSCULAR | Status: AC
  Administered 2015-12-03: 60 mg via INTRAMUSCULAR

## 2015-12-03 MED ORDER — CEFTRIAXONE SODIUM 1 G IJ SOLR
1.0000 g | Freq: Once | INTRAMUSCULAR | Status: AC
  Administered 2015-12-03: 1 g via INTRAMUSCULAR

## 2015-12-03 MED ORDER — LIDOCAINE HCL (PF) 1 % IJ SOLN
INTRAMUSCULAR | Status: AC
  Filled 2015-12-03: qty 2

## 2015-12-03 MED FILL — CLINDAMYCIN HCL 300 MG CAP: 300 | 14 days supply | Qty: 42 | Fill #0

## 2015-12-03 NOTE — ED Notes (Signed)
No adverse response to either medication given today.  Patient initially reported never having either medicine and concerned for patient response.  No adverse response on discharge from department.

## 2015-12-03 NOTE — ED Triage Notes (Signed)
History of the same.  Patient says he has "infected lymph nodes".  Recurrent issue.

## 2015-12-03 NOTE — ED Provider Notes (Signed)
CSN: [id number]     Arrival date & time 12/03/15  1002 History   First MD Initiated Contact with Patient 12/03/15 1102     Chief Complaint  Patient presents with  . Abscess   (Consider location/radiation/quality/duration/timing/severity/associated sxs/prior Treatment) Patient has hx of suppurativa Hydradenitis left axilla and is having a flare.  He states that 10 days was not quite enough abx's to get this under control and needed an additional 10 days. He states he has hx of MRSA infections.   The history is provided by the patient.  Abscess  Location:  Shoulder/arm Shoulder/arm abscess location:  L upper arm Size:  4 cm diameter left axilla Abscess quality: induration   Red streaking: no   Duration:  2 days Progression:  Unchanged Chronicity:  New Relieved by:  Nothing Worsened by:  Nothing Ineffective treatments:  None tried Risk factors: hx of MRSA     Past Medical History:  Diagnosis Date  . Arthritis    "all 10 toes" (06/19/2014)  . Depression   . High cholesterol   . Hypertension   . Necrotizing fasciitis ([place])   . Plantar fasciitis, bilateral   . Sleep apnea    "not suppose to wear mask" (06/19/2014)-does not have a cpap-  . Type II diabetes mellitus ([place])    Past Surgical History:  Procedure Laterality Date  . APPLICATION OF A-CELL OF HEAD/NECK N/A 07/05/2014   Procedure: APPLICATION OF A-CELL OF HEAD/NECK;  Surgeon: Izora Gala, MD;  Location: [place];  Service: ENT;  Laterality: N/A;  . APPLICATION OF A-CELL OF HEAD/NECK N/A 07/20/2014   Procedure: APPLICATION OF A-CELL OF HEAD/NECK;  Surgeon: Theodoro Kos, DO;  Location: [place];  Service: Plastics;  Laterality: N/A;  . APPLICATION OF A-CELL OF HEAD/NECK N/A 08/02/2014   Procedure: APPLICATION OF A-CELL OF HEAD/NECK;  Surgeon: Theodoro Kos, DO;  Location: [place];  Service: Plastics;  Laterality: N/A;  . APPLICATION OF WOUND VAC N/A 07/05/2014   Procedure: APPLICATION OF WOUND  VAC;  Surgeon: Izora Gala, MD;  Location: [place];  Service: ENT;  Laterality: N/A;  . INCISION AND DRAINAGE ABSCESS N/A 06/24/2014   Procedure: INCISION AND DRAINAGE OF POSTERIOR NECK ABCESS;  Surgeon: Izora Gala, MD;  Location: [place];  Service: ENT;  Laterality: N/A;  . INCISION AND DRAINAGE ABSCESS N/A 06/26/2014   Procedure: REPEAT [place] OF NECK  WOUND;  Surgeon: Izora Gala, MD;  Location: [place];  Service: ENT;  Laterality: N/A;  . INCISION AND DRAINAGE ABSCESS N/A 06/28/2014   Procedure:  REPEAT DEBRIEDMENT OF NECK  WOUND;  Surgeon: Izora Gala, MD;  Location: [place];  Service: ENT;  Laterality: N/A;  . INCISION AND DRAINAGE OF WOUND N/A 07/05/2014   Procedure: IRRIGATION AND DEBRIDEMENT WOUND;  Surgeon: Izora Gala, MD;  Location: [place];  Service: ENT;  Laterality: N/A;  . INCISION AND DRAINAGE OF WOUND N/A 07/20/2014   Procedure: IRRIGATION AND DEBRIDEMENT OF NECROTIZING FASCIITIS WOUND OF NECK WITH PLACEMENT OF ACELL/VAC;  Surgeon: Theodoro Kos, DO;  Location: [place];  Service: Plastics;  Laterality: N/A;  . INCISION AND DRAINAGE OF WOUND N/A 08/02/2014   Procedure: IRRIGATION AND DEBRIDEMENT OF POSTERIOR NECK WOUND WITH SURGICAL PREP AND PLACEMENT OF A CELL AND VAC;  Surgeon: Theodoro Kos, DO;  Location: [place];  Service: Plastics;  Laterality: N/A;  . INCISION AND DRAINAGE OF WOUND N/A 08/10/2014   Procedure: IRRIGATION AND DEBRIDEMENT OF POSTERIOR NECK WOUND WITH  SURGICAL PREP WITH SPLIT THICKNESS SKIN GRAFT AND PLACEMENT OF VAC;  Surgeon: Theodoro Kos, DO;  Location: [place];  Service: Plastics;  Laterality: N/A;  . REDUCTION OF TORSION OF TESTIS Left 1979  . SKIN SPLIT GRAFT N/A 08/10/2014   Procedure: SKIN GRAFT SPLIT THICKNESS;  Surgeon: Theodoro Kos, DO;  Location: [place];  Service: Plastics;  Laterality: N/A;  From Left Posterior Thigh  . WOUND DEBRIDEMENT N/A 06/24/2014   Procedure: DEBRIDEMENT OF  NECROTIZING FASCITIS ON POSTERIOR NECK;  Surgeon: Izora Gala, MD;  Location: [facility];  Service: ENT;  Laterality: N/A;   Family History  Problem Relation Age of Onset  . Family history unknown: Yes   Social History  Substance Use Topics  . Smoking status: Current Every Day Smoker    Packs/day: 1.00    Years: 36.00    Types: Cigarettes  . Smokeless tobacco: Never Used  . Alcohol use No     Comment: No beer since 05/2014    Review of Systems  Constitutional: Negative.   HENT: Negative.   Eyes: Negative.   Respiratory: Negative.   Cardiovascular: Negative.   Gastrointestinal: Negative.   Endocrine: Negative.   Genitourinary: Negative.   Musculoskeletal: Negative.   Skin: Positive for wound.  Allergic/Immunologic: Negative.   Neurological: Negative.   Hematological: Negative.   Psychiatric/Behavioral: Negative.     Allergies  Review of patient's allergies indicates no known allergies.  Home Medications   Prior to Admission medications   Medication Sig Start Date End Date Taking? Authorizing Provider  ARIPiprazole (ABILIFY) 5 MG tablet Take 1 tablet (5 mg total) by mouth every morning. 01/31/15   Tiffany Daneil Dan, PA-C  atenolol (TENORMIN) 50 MG tablet Take 1 tablet (50 mg total) by mouth daily. 01/10/15   Lance Bosch, NP  Blood Glucose Monitoring Suppl [facility] TRUERESULT GLUCOSE SYS) W/DEVICE KIT Check sugars 4 times a day 07/14/14   Micheline Chapman, NP  canagliflozin [facility]) 100 MG TABS tablet Take 1 tablet (100 mg total) by mouth daily before breakfast. 05/11/15   Lance Bosch, NP  clindamycin (CLEOCIN) 300 MG capsule Take 1 capsule (300 mg total) by mouth 3 (three) times daily. 07/17/15   Janne Napoleon, NP  clindamycin (CLEOCIN) 300 MG capsule Take 1 capsule (300 mg total) by mouth 3 (three) times daily. 12/03/15   Lysbeth Penner, FNP  clindamycin (CLEOCIN) 300 MG capsule Take 1 capsule (300 mg total) by mouth 3 (three) times daily. 12/03/15   Lysbeth Penner, FNP   DULoxetine (CYMBALTA) 30 MG capsule Take 3 capsules (90 mg total) by mouth every morning. 01/31/15   Tiffany Daneil Dan, PA-C  gabapentin (NEURONTIN) 300 MG capsule Take 4 capsules (1,200 mg total) by mouth 2 (two) times daily. 05/29/15   Tresa Garter, MD  glucose blood (AGAMATRIX PRESTO TEST) test strip Use as instructed 07/14/14   Micheline Chapman, NP  insulin aspart (NOVOLOG) 100 UNIT/ML injection Inject 3-20 Units into the skin 3 (three) times daily before meals. Per sliding scale. 01/10/15   Lance Bosch, NP  Insulin Detemir (LEVEMIR) 100 UNIT/ML Pen Inject 50 Units into the skin 2 (two) times daily. 01/10/15   Lance Bosch, NP  Insulin Syringes, Disposable, U-100 1 ML MISC Use Novolog 3 times per day 08/11/14   Lance Bosch, NP  Lancets (ACCU-CHEK MULTICLIX) lancets Use as instructed 07/14/14   Micheline Chapman, NP  lisinopril (PRINIVIL,ZESTRIL) 10 MG tablet Take 1  tablet (10 mg total) by mouth daily. For hypertension and renal protection. 01/10/15   Lance Bosch, NP  metFORMIN (GLUCOPHAGE) 1000 MG tablet Take 1 tablet (1,000 mg total) by mouth 2 (two) times daily with a meal. 01/10/15   Lance Bosch, NP   Meds Ordered and Administered this Visit   Medications  cefTRIAXone (ROCEPHIN) injection 1 g (not administered)  ketorolac (TORADOL) injection 60 mg (not administered)    BP 123/83 (BP Location: Right Arm)   Pulse 109   Temp 98.5 F (36.9 C) (Oral)   Resp 24   SpO2 96%  No data found.   Physical Exam  Constitutional: He appears well-developed and well-nourished.  HENT:  Head: Normocephalic and atraumatic.  Eyes: Conjunctivae and EOM are normal. Pupils are equal, round, and reactive to light.  Neck: Normal range of motion. Neck supple.  Cardiovascular: Normal rate and regular rhythm.   Pulmonary/Chest: Effort normal and breath sounds normal.  Skin: There is erythema.  Left axilla with suppurativa hydradenitis and tender.  Nursing note and vitals reviewed.   Urgent  Care Course   Clinical Course    Procedures (including critical care time)  Labs Review Labs Reviewed - No data to display  Imaging Review No results found.   Visual Acuity Review  Right Eye Distance:   Left Eye Distance:   Bilateral Distance:    Right Eye Near:   Left Eye Near:    Bilateral Near:         MDM   1. Cellulitis of left axilla   2. Axillary hidradenitis suppurativa    Rocephin 1 gram IM Toradol 63m IM Tylenol and motrin otc as directed.    WLysbeth Penner FNP 12/03/15 1122

## 2016-01-01 ENCOUNTER — Encounter: Payer: Self-pay | Admitting: Family Medicine

## 2016-01-01 ENCOUNTER — Ambulatory Visit: Payer: Medicare Other | Attending: Family Medicine | Admitting: Family Medicine

## 2016-01-01 VITALS — BP 129/81 | HR 108 | Temp 98.2°F | Wt 220.2 lb

## 2016-01-01 DIAGNOSIS — Z794 Long term (current) use of insulin: Secondary | ICD-10-CM | POA: Insufficient documentation

## 2016-01-01 DIAGNOSIS — Z79899 Other long term (current) drug therapy: Secondary | ICD-10-CM | POA: Diagnosis not present

## 2016-01-01 DIAGNOSIS — E1149 Type 2 diabetes mellitus with other diabetic neurological complication: Secondary | ICD-10-CM | POA: Diagnosis not present

## 2016-01-01 DIAGNOSIS — F332 Major depressive disorder, recurrent severe without psychotic features: Secondary | ICD-10-CM | POA: Diagnosis not present

## 2016-01-01 DIAGNOSIS — I10 Essential (primary) hypertension: Secondary | ICD-10-CM | POA: Insufficient documentation

## 2016-01-01 DIAGNOSIS — E1140 Type 2 diabetes mellitus with diabetic neuropathy, unspecified: Secondary | ICD-10-CM | POA: Insufficient documentation

## 2016-01-01 DIAGNOSIS — E1165 Type 2 diabetes mellitus with hyperglycemia: Secondary | ICD-10-CM | POA: Insufficient documentation

## 2016-01-01 LAB — POCT GLYCOSYLATED HEMOGLOBIN (HGB A1C): Hemoglobin A1C: 13.7

## 2016-01-01 LAB — GLUCOSE, POCT (MANUAL RESULT ENTRY): POC Glucose: 362 mg/dl — AB (ref 70–99)

## 2016-01-01 MED ORDER — INSULIN DETEMIR 100 UNIT/ML FLEXPEN: 50.0000 [IU] | PEN_INJECTOR | Freq: Two times a day (BID) | SUBCUTANEOUS | 5 refills | Status: DC

## 2016-01-01 MED ORDER — METFORMIN HCL 1000 MG PO TABS: 1000.0000 mg | ORAL_TABLET | Freq: Two times a day (BID) | ORAL | 5 refills | Status: DC

## 2016-01-01 MED ORDER — CANAGLIFLOZIN 100 MG PO TABS: 100.0000 mg | ORAL_TABLET | Freq: Every day | ORAL | 5 refills | Status: DC

## 2016-01-01 MED ORDER — INSULIN ASPART 100 UNIT/ML ~~LOC~~ SOLN: 3.0000 [IU] | Freq: Three times a day (TID) | SUBCUTANEOUS | 5 refills | Status: DC

## 2016-01-01 MED ORDER — ATORVASTATIN CALCIUM 20 MG PO TABS
20.0000 mg | ORAL_TABLET | Freq: Every day | ORAL | 3 refills | Status: AC
Start: 2016-01-01 — End: ?

## 2016-01-01 MED ORDER — LISINOPRIL 5 MG PO TABS: 10.0000 mg | ORAL_TABLET | Freq: Every day | ORAL | 5 refills | Status: AC

## 2016-01-01 MED ORDER — GABAPENTIN 300 MG PO CAPS: 1200.0000 mg | ORAL_CAPSULE | Freq: Two times a day (BID) | ORAL | 5 refills | Status: AC

## 2016-01-01 MED FILL — metFORMIN HCL 1000 MG TABS: 1000 | 30 days supply | Qty: 60 | Fill #0

## 2016-01-01 MED FILL — LEVEMIR FLEXTOUCH 100 UNITS: 100 | 15 days supply | Qty: 15 | Fill #0

## 2016-01-01 MED FILL — LISINOPRIL 10 MG TABLET: 10 | 30 days supply | Qty: 30 | Fill #0

## 2016-01-01 MED FILL — ATORVASTATIN 20 MG TABLET: 20 | 30 days supply | Qty: 30 | Fill #0

## 2016-01-01 MED FILL — GABAPENTIN 300 MG CAPSULE: 300 | 30 days supply | Qty: 240 | Fill #0

## 2016-01-01 NOTE — Progress Notes (Signed)
Subjective:  Patient ID: Eric Shaw, male    DOB: 12-03-1960  Age: 55 y.o. MRN: [id number]  CC: Diabetes   HPI Eric Shaw is a 55 year old male with a history of type 2 diabetes mellitus (A1c 13.7), diabetic neuropathy, hypertension, depression who comes in to the clinic to establish care with me.  He has been off all his medications for the last 4 months due to difficulty affording them. He previously received mental health care from a psychiatrist at [facility] but has not been there in a while.  He has not been checking his sugars neither has he been compliant with the diabetic diet and is requesting refills of all his medications today. He suffers from diabetic neuropathy in his feet.  Past Medical History:  Diagnosis Date  . Arthritis    "all 10 toes" (06/19/2014)  . Depression   . High cholesterol   . Hypertension   . Necrotizing fasciitis ([place])   . Plantar fasciitis, bilateral   . Sleep apnea    "not suppose to wear mask" (06/19/2014)-does not have a cpap-  . Type II diabetes mellitus ([place])     Past Surgical History:  Procedure Laterality Date  . APPLICATION OF A-CELL OF HEAD/NECK N/A 07/05/2014   Procedure: APPLICATION OF A-CELL OF HEAD/NECK;  Surgeon: Izora Gala, MD;  Location: [place];  Service: ENT;  Laterality: N/A;  . APPLICATION OF A-CELL OF HEAD/NECK N/A 07/20/2014   Procedure: APPLICATION OF A-CELL OF HEAD/NECK;  Surgeon: Theodoro Kos, DO;  Location: [place];  Service: Plastics;  Laterality: N/A;  . APPLICATION OF A-CELL OF HEAD/NECK N/A 08/02/2014   Procedure: APPLICATION OF A-CELL OF HEAD/NECK;  Surgeon: Theodoro Kos, DO;  Location: [place];  Service: Plastics;  Laterality: N/A;  . APPLICATION OF WOUND VAC N/A 07/05/2014   Procedure: APPLICATION OF WOUND VAC;  Surgeon: Izora Gala, MD;  Location: [place];  Service: ENT;  Laterality: N/A;  . INCISION AND DRAINAGE ABSCESS N/A 06/24/2014   Procedure: INCISION AND DRAINAGE OF  POSTERIOR NECK ABCESS;  Surgeon: Izora Gala, MD;  Location: [place];  Service: ENT;  Laterality: N/A;  . INCISION AND DRAINAGE ABSCESS N/A 06/26/2014   Procedure: REPEAT [place] OF NECK  WOUND;  Surgeon: Izora Gala, MD;  Location: [place];  Service: ENT;  Laterality: N/A;  . INCISION AND DRAINAGE ABSCESS N/A 06/28/2014   Procedure:  REPEAT DEBRIEDMENT OF NECK  WOUND;  Surgeon: Izora Gala, MD;  Location: [place];  Service: ENT;  Laterality: N/A;  . INCISION AND DRAINAGE OF WOUND N/A 07/05/2014   Procedure: IRRIGATION AND DEBRIDEMENT WOUND;  Surgeon: Izora Gala, MD;  Location: [place];  Service: ENT;  Laterality: N/A;  . INCISION AND DRAINAGE OF WOUND N/A 07/20/2014   Procedure: IRRIGATION AND DEBRIDEMENT OF NECROTIZING FASCIITIS WOUND OF NECK WITH PLACEMENT OF ACELL/VAC;  Surgeon: Theodoro Kos, DO;  Location: [place];  Service: Plastics;  Laterality: N/A;  . INCISION AND DRAINAGE OF WOUND N/A 08/02/2014   Procedure: IRRIGATION AND DEBRIDEMENT OF POSTERIOR NECK WOUND WITH SURGICAL PREP AND PLACEMENT OF A CELL AND VAC;  Surgeon: Theodoro Kos, DO;  Location: [place];  Service: Plastics;  Laterality: N/A;  . INCISION AND DRAINAGE OF WOUND N/A 08/10/2014   Procedure: IRRIGATION AND DEBRIDEMENT OF POSTERIOR NECK WOUND WITH SURGICAL PREP WITH SPLIT THICKNESS SKIN GRAFT AND PLACEMENT OF VAC;  Surgeon: Theodoro Kos, DO;  Location: [place];  Service: Plastics;  Laterality: N/A;  .  REDUCTION OF TORSION OF TESTIS Left 1979  . SKIN SPLIT GRAFT N/A 08/10/2014   Procedure: SKIN GRAFT SPLIT THICKNESS;  Surgeon: Theodoro Kos, DO;  Location: [place];  Service: Plastics;  Laterality: N/A;  From Left Posterior Thigh  . WOUND DEBRIDEMENT N/A 06/24/2014   Procedure: DEBRIDEMENT OF NECROTIZING FASCITIS ON POSTERIOR NECK;  Surgeon: Izora Gala, MD;  Location: [place];  Service: ENT;  Laterality: N/A;    No Known Allergies  Current Outpatient Prescriptions  on File Prior to Visit  Medication Sig Dispense Refill  . ARIPiprazole (ABILIFY) 5 MG tablet Take 1 tablet (5 mg total) by mouth every morning. 30 tablet 0  . atenolol (TENORMIN) 50 MG tablet Take 1 tablet (50 mg total) by mouth daily. (Patient not taking: Reported on 01/01/2016) 30 tablet 4  . Blood Glucose Monitoring Suppl ([facility] TRUERESULT GLUCOSE SYS) W/DEVICE KIT Check sugars 4 times a day 1 kit 0  . DULoxetine (CYMBALTA) 30 MG capsule Take 3 capsules (90 mg total) by mouth every morning. 30 capsule 0  . glucose blood (AGAMATRIX PRESTO TEST) test strip Use as instructed 100 each 11  . Insulin Syringes, Disposable, U-100 1 ML MISC Use Novolog 3 times per day 100 each 5  . Lancets (ACCU-CHEK MULTICLIX) lancets Use as instructed 100 each 12   No current facility-administered medications on file prior to visit.      Outpatient Medications Prior to Visit  Medication Sig Dispense Refill  . ARIPiprazole (ABILIFY) 5 MG tablet Take 1 tablet (5 mg total) by mouth every morning. 30 tablet 0  . atenolol (TENORMIN) 50 MG tablet Take 1 tablet (50 mg total) by mouth daily. (Patient not taking: Reported on 01/01/2016) 30 tablet 4  . Blood Glucose Monitoring Suppl (MEIJER TRUERESULT GLUCOSE SYS) W/DEVICE KIT Check sugars 4 times a day 1 kit 0  . DULoxetine (CYMBALTA) 30 MG capsule Take 3 capsules (90 mg total) by mouth every morning. 30 capsule 0  . glucose blood (AGAMATRIX PRESTO TEST) test strip Use as instructed 100 each 11  . Insulin Syringes, Disposable, U-100 1 ML MISC Use Novolog 3 times per day 100 each 5  . Lancets (ACCU-CHEK MULTICLIX) lancets Use as instructed 100 each 12  . canagliflozin (INVOKANA) 100 MG TABS tablet Take 1 tablet (100 mg total) by mouth daily before breakfast. 30 tablet 4  . clindamycin (CLEOCIN) 300 MG capsule Take 1 capsule (300 mg total) by mouth 3 (three) times daily. 30 capsule 0  . clindamycin (CLEOCIN) 300 MG capsule Take 1 capsule (300 mg total) by mouth 3 (three)  times daily. 42 capsule 0  . clindamycin (CLEOCIN) 300 MG capsule Take 1 capsule (300 mg total) by mouth 3 (three) times daily. 42 capsule 0  . gabapentin (NEURONTIN) 300 MG capsule Take 4 capsules (1,200 mg total) by mouth 2 (two) times daily. 240 capsule 1  . insulin aspart (NOVOLOG) 100 UNIT/ML injection Inject 3-20 Units into the skin 3 (three) times daily before meals. Per sliding scale. 3 vial 11  . Insulin Detemir (LEVEMIR) 100 UNIT/ML Pen Inject 50 Units into the skin 2 (two) times daily. 15 mL 11  . lisinopril (PRINIVIL,ZESTRIL) 10 MG tablet Take 1 tablet (10 mg total) by mouth daily. For hypertension and renal protection. 30 tablet 5  . metFORMIN (GLUCOPHAGE) 1000 MG tablet Take 1 tablet (1,000 mg total) by mouth 2 (two) times daily with a meal. 60 tablet 5   No facility-administered medications prior to visit.  ROS Review of Systems  Constitutional: Negative for activity change and appetite change.  HENT: Negative for sinus pressure and sore throat.   Eyes: Negative for visual disturbance.  Respiratory: Negative for cough, chest tightness and shortness of breath.   Cardiovascular: Negative for chest pain and leg swelling.  Gastrointestinal: Negative for abdominal distention, abdominal pain, constipation and diarrhea.  Endocrine: Negative.   Genitourinary: Negative for dysuria.  Musculoskeletal: Negative for joint swelling and myalgias.  Skin: Negative for rash.  Allergic/Immunologic: Negative.   Neurological: Positive for numbness. Negative for weakness and light-headedness.  Psychiatric/Behavioral: Negative for dysphoric mood and suicidal ideas.    Objective:  BP 129/81 (BP Location: Right Arm, Patient Position: Sitting, Cuff Size: Small)   Pulse (!) 108   Temp 98.2 F (36.8 C) (Oral)   Wt 220 lb 3.2 oz (99.9 kg)   SpO2 96%   BMI 31.60 kg/m   BP/Weight 01/01/2016 12/03/2015 11/12/6267  Systolic BP 485 462 703  Diastolic BP 81 83 83  Wt. (Lbs) 220.2 - -  BMI 31.6  - -  Some encounter information is confidential and restricted. Go to Review Flowsheets activity to see all data.      Physical Exam  Constitutional: He is oriented to person, place, and time. He appears well-developed and well-nourished.  Cardiovascular: Normal heart sounds and intact distal pulses.  Tachycardia present.   No murmur heard. Pulmonary/Chest: Effort normal and breath sounds normal. He has no wheezes. He has no rales. He exhibits no tenderness.  Abdominal: Soft. Bowel sounds are normal. He exhibits no distension and no mass. There is no tenderness.  Musculoskeletal: Normal range of motion.  Neurological: He is alert and oriented to person, place, and time.     Assessment & Plan:   1. Severe episode of recurrent major depressive disorder, without psychotic features ([place]) Previously on Cymbalta and Abilify Advised to get in touch with his Psychiatrist (at [facility]) for a visit as he is currently off medications  2. Other diabetic neurological complication associated with type 2 diabetes mellitus ([place]) Uncontrolled with Hba1c of 13.7 due to non compliance and being off medications Commenced on Statin due to increased cardiovascular risk Will review blood sugar log at next visit - POCT glucose (manual entry) - POCT glycosylated hemoglobin (Hb A1C) - canagliflozin (INVOKANA) 100 MG TABS tablet; Take 1 tablet (100 mg total) by mouth daily before breakfast.  Dispense: 30 tablet; Refill: 5 - gabapentin (NEURONTIN) 300 MG capsule; Take 4 capsules (1,200 mg total) by mouth 2 (two) times daily.  Dispense: 240 capsule; Refill: 5 - Insulin Detemir (LEVEMIR) 100 UNIT/ML Pen; Inject 50 Units into the skin 2 (two) times daily.  Dispense: 15 mL; Refill: 5 - insulin aspart (NOVOLOG) 100 UNIT/ML injection; Inject 3-20 Units into the skin 3 (three) times daily before meals. Per sliding scale.  Dispense: 3 vial; Refill: 5 - metFORMIN (GLUCOPHAGE) 1000 MG tablet; Take 1 tablet (1,000 mg total) by  mouth 2 (two) times daily with a meal.  Dispense: 60 tablet; Refill: 5 - COMPLETE METABOLIC PANEL WITH GFR; Future - Lipid panel; Future - Microalbumin / creatinine urine ratio; Future - atorvastatin (LIPITOR) 20 MG tablet; Take 1 tablet (20 mg total) by mouth daily.  Dispense: 30 tablet; Refill: 3  3. Essential hypertension Controlled - lisinopril (PRINIVIL,ZESTRIL) 5 MG tablet; Take 2 tablets (10 mg total) by mouth daily. For hypertension and renal protection.  Dispense: 30 tablet; Refill: 5   Meds ordered this encounter  Medications  .  canagliflozin (INVOKANA) 100 MG TABS tablet    Sig: Take 1 tablet (100 mg total) by mouth daily before breakfast.    Dispense:  30 tablet    Refill:  5  . gabapentin (NEURONTIN) 300 MG capsule    Sig: Take 4 capsules (1,200 mg total) by mouth 2 (two) times daily.    Dispense:  240 capsule    Refill:  5  . Insulin Detemir (LEVEMIR) 100 UNIT/ML Pen    Sig: Inject 50 Units into the skin 2 (two) times daily.    Dispense:  15 mL    Refill:  5  . insulin aspart (NOVOLOG) 100 UNIT/ML injection    Sig: Inject 3-20 Units into the skin 3 (three) times daily before meals. Per sliding scale.    Dispense:  3 vial    Refill:  5    Please dispense flex pens  . metFORMIN (GLUCOPHAGE) 1000 MG tablet    Sig: Take 1 tablet (1,000 mg total) by mouth 2 (two) times daily with a meal.    Dispense:  60 tablet    Refill:  5  . lisinopril (PRINIVIL,ZESTRIL) 5 MG tablet    Sig: Take 2 tablets (10 mg total) by mouth daily. For hypertension and renal protection.    Dispense:  30 tablet    Refill:  5  . atorvastatin (LIPITOR) 20 MG tablet    Sig: Take 1 tablet (20 mg total) by mouth daily.    Dispense:  30 tablet    Refill:  3    Follow-up: Return in about 3 weeks (around 01/22/2016) for Follow-up on diabetes mellitus.   Arnoldo Morale MD

## 2016-01-02 MED ORDER — INSULIN ASPART 100 UNIT/ML FLEXPEN: 3.0000 [IU] | PEN_INJECTOR | Freq: Three times a day (TID) | SUBCUTANEOUS | 5 refills | Status: DC

## 2016-01-02 MED FILL — INVOKANA 100 MG TABLET: 100 | 30 days supply | Qty: 30 | Fill #0

## 2016-01-02 MED FILL — NOVOLOG FLEXPEN SYRINGE: 100 | 25 days supply | Qty: 15 | Fill #0

## 2016-01-08 ENCOUNTER — Telehealth: Payer: Self-pay | Admitting: Family Medicine

## 2016-01-08 ENCOUNTER — Ambulatory Visit: Payer: Medicaid Other | Attending: Internal Medicine

## 2016-01-08 DIAGNOSIS — E1142 Type 2 diabetes mellitus with diabetic polyneuropathy: Secondary | ICD-10-CM

## 2016-01-08 DIAGNOSIS — E1149 Type 2 diabetes mellitus with other diabetic neurological complication: Secondary | ICD-10-CM | POA: Diagnosis not present

## 2016-01-08 LAB — COMPLETE METABOLIC PANEL WITH GFR
ALT: 13 U/L (ref 9–46)
AST: 13 U/L (ref 10–35)
Albumin: 4.1 g/dL (ref 3.6–5.1)
Alkaline Phosphatase: 88 U/L (ref 40–115)
BILIRUBIN TOTAL: 0.4 mg/dL (ref 0.2–1.2)
BUN: 12 mg/dL (ref 7–25)
CHLORIDE: 102 mmol/L (ref 98–110)
CO2: 26 mmol/L (ref 20–31)
CREATININE: 1.05 mg/dL (ref 0.70–1.33)
Calcium: 9.5 mg/dL (ref 8.6–10.3)
GFR, Est African American: >89 mL/min (ref >=60)
GFR, Est Non African American: 80 mL/min (ref >=60)
Glucose, Bld: 296 mg/dL — ABNORMAL HIGH (ref 65–99)
POTASSIUM: 4.4 mmol/L (ref 3.5–5.3)
Sodium: 138 mmol/L (ref 135–146)
Total Protein: 7.2 g/dL (ref 6.1–8.1)

## 2016-01-08 LAB — LIPID PANEL
Cholesterol: 138 mg/dL (ref 125–200)
HDL: 34 mg/dL — AB (ref >=40)
Total CHOL/HDL Ratio: 4.1 Ratio (ref <=5.0)
Triglycerides: 475 mg/dL — ABNORMAL HIGH (ref <150)

## 2016-01-08 NOTE — Telephone Encounter (Signed)
Patient needs a referral for eye doctor. Please follow up.

## 2016-01-08 NOTE — Progress Notes (Signed)
Pt is here for lab work only. 

## 2016-01-09 LAB — MICROALBUMIN / CREATININE URINE RATIO
CREATININE, URINE: 84 mg/dL (ref 20–370)
Microalb Creat Ratio: 27 mcg/mg creat (ref <30)
Microalb, Ur: 2.3 mg/dL

## 2016-01-09 NOTE — Telephone Encounter (Signed)
Referral has been placed. 

## 2016-01-10 ENCOUNTER — Encounter: Payer: Self-pay | Admitting: Family Medicine

## 2016-01-10 NOTE — Telephone Encounter (Signed)
Patient called and is aware that referral was placed to the eye doctor.

## 2016-01-30 ENCOUNTER — Telehealth: Payer: Self-pay | Admitting: Family Medicine

## 2016-01-30 DIAGNOSIS — E1149 Type 2 diabetes mellitus with other diabetic neurological complication: Secondary | ICD-10-CM

## 2016-01-30 NOTE — Telephone Encounter (Signed)
Pt called requesting medication refill on  Insulin Detemir (LEVEMIR) 100 UNIT/ML Pen. Please f/up

## 2016-01-30 NOTE — Telephone Encounter (Signed)
[facility] to [facility] 01/01/16 with refills

## 2016-02-04 ENCOUNTER — Telehealth: Payer: Self-pay | Admitting: Family Medicine

## 2016-02-04 NOTE — Telephone Encounter (Signed)
Eric Shaw from [facility]  requesting a referral letter  Pt was sent to [facility]: NPDR with [facility]  CB# [phone]  Gave NPI number due to Pt having Medicaid but office requesting a referral letter

## 2016-02-06 NOTE — Telephone Encounter (Signed)
Pt. Came into facility stating that his new pharmacy is [facility] on [address].

## 2016-02-06 NOTE — Telephone Encounter (Signed)
Noted . I called [facility]  And I give them the NPI # no needed referral

## 2016-02-08 NOTE — Telephone Encounter (Signed)
Pharmacy updated.

## 2016-02-15 ENCOUNTER — Ambulatory Visit: Payer: Medicare Other | Attending: Family Medicine | Admitting: Family Medicine

## 2016-02-15 ENCOUNTER — Encounter: Payer: Self-pay | Admitting: Family Medicine

## 2016-02-15 VITALS — BP 127/71 | HR 91 | Temp 98.2°F | Ht 70.5 in | Wt 231.6 lb

## 2016-02-15 DIAGNOSIS — E1142 Type 2 diabetes mellitus with diabetic polyneuropathy: Secondary | ICD-10-CM | POA: Insufficient documentation

## 2016-02-15 DIAGNOSIS — I10 Essential (primary) hypertension: Secondary | ICD-10-CM | POA: Diagnosis not present

## 2016-02-15 DIAGNOSIS — G4730 Sleep apnea, unspecified: Secondary | ICD-10-CM | POA: Diagnosis not present

## 2016-02-15 DIAGNOSIS — E1149 Type 2 diabetes mellitus with other diabetic neurological complication: Secondary | ICD-10-CM | POA: Insufficient documentation

## 2016-02-15 DIAGNOSIS — Z23 Encounter for immunization: Secondary | ICD-10-CM | POA: Diagnosis not present

## 2016-02-15 DIAGNOSIS — Z79899 Other long term (current) drug therapy: Secondary | ICD-10-CM | POA: Diagnosis not present

## 2016-02-15 DIAGNOSIS — E1140 Type 2 diabetes mellitus with diabetic neuropathy, unspecified: Secondary | ICD-10-CM | POA: Diagnosis present

## 2016-02-15 DIAGNOSIS — F329 Major depressive disorder, single episode, unspecified: Secondary | ICD-10-CM | POA: Diagnosis not present

## 2016-02-15 DIAGNOSIS — E7800 Pure hypercholesterolemia, unspecified: Secondary | ICD-10-CM | POA: Diagnosis not present

## 2016-02-15 DIAGNOSIS — Z794 Long term (current) use of insulin: Secondary | ICD-10-CM | POA: Insufficient documentation

## 2016-02-15 LAB — GLUCOSE, POCT (MANUAL RESULT ENTRY): POC GLUCOSE: 148 mg/dL — AB (ref 70–99)

## 2016-02-15 MED ORDER — INSULIN DETEMIR 100 UNIT/ML FLEXPEN: 50.0000 [IU] | PEN_INJECTOR | Freq: Two times a day (BID) | SUBCUTANEOUS | 5 refills | Status: DC

## 2016-02-15 MED ORDER — GLUCOSE BLOOD VI STRP: ORAL_STRIP | 11 refills | Status: DC

## 2016-02-15 NOTE — Patient Instructions (Signed)
Diabetes Mellitus and Food It is important for you to manage your blood sugar (glucose) level. Your blood glucose level can be greatly affected by what you eat. Eating healthier foods in the appropriate amounts throughout the day at about the same time each day will help you control your blood glucose level. It can also help slow or prevent worsening of your diabetes mellitus. Healthy eating may even help you improve the level of your blood pressure and reach or maintain a healthy weight.  General recommendations for healthful eating and cooking habits include:  Eating meals and snacks regularly. Avoid going long periods of time without eating to lose weight.  Eating a diet that consists mainly of plant-based foods, such as fruits, vegetables, nuts, legumes, and whole grains.  Using low-heat cooking methods, such as baking, instead of high-heat cooking methods, such as deep frying. Work with your dietitian to make sure you understand how to use the Nutrition Facts information on food labels. HOW CAN FOOD AFFECT ME? Carbohydrates Carbohydrates affect your blood glucose level more than any other type of food. Your dietitian will help you determine how many carbohydrates to eat at each meal and teach you how to count carbohydrates. Counting carbohydrates is important to keep your blood glucose at a healthy level, especially if you are using insulin or taking certain medicines for diabetes mellitus. Alcohol Alcohol can cause sudden decreases in blood glucose (hypoglycemia), especially if you use insulin or take certain medicines for diabetes mellitus. Hypoglycemia can be a life-threatening condition. Symptoms of hypoglycemia (sleepiness, dizziness, and disorientation) are similar to symptoms of having too much alcohol.  If your health care provider has given you approval to drink alcohol, do so in moderation and use the following guidelines:  Women should not have more than one drink per day, and men  should not have more than two drinks per day. One drink is equal to:  12 oz of beer.  5 oz of wine.  1 oz of hard liquor.  Do not drink on an empty stomach.  Keep yourself hydrated. Have water, diet soda, or unsweetened iced tea.  Regular soda, juice, and other mixers might contain a lot of carbohydrates and should be counted. WHAT FOODS ARE NOT RECOMMENDED? As you make food choices, it is important to remember that all foods are not the same. Some foods have fewer nutrients per serving than other foods, even though they might have the same number of calories or carbohydrates. It is difficult to get your body what it needs when you eat foods with fewer nutrients. Examples of foods that you should avoid that are high in calories and carbohydrates but low in nutrients include:  Trans fats (most processed foods list trans fats on the Nutrition Facts label).  Regular soda.  Juice.  Candy.  Sweets, such as cake, pie, doughnuts, and cookies.  Fried foods. WHAT FOODS CAN I EAT? Eat nutrient-rich foods, which will nourish your body and keep you healthy. The food you should eat also will depend on several factors, including:  The calories you need.  The medicines you take.  Your weight.  Your blood glucose level.  Your blood pressure level.  Your cholesterol level. You should eat a variety of foods, including:  Protein.  Lean cuts of meat.  Proteins low in saturated fats, such as fish, egg whites, and beans. Avoid processed meats.  Fruits and vegetables.  Fruits and vegetables that may help control blood glucose levels, such as apples, mangoes, and   yams.  Dairy products.  Choose fat-free or low-fat dairy products, such as milk, yogurt, and cheese.  Grains, bread, pasta, and rice.  Choose whole grain products, such as multigrain bread, whole oats, and brown rice. These foods may help control blood pressure.  Fats.  Foods containing healthful fats, such as nuts,  avocado, olive oil, canola oil, and fish. DOES EVERYONE WITH DIABETES MELLITUS HAVE THE SAME MEAL PLAN? Because every person with diabetes mellitus is different, there is not one meal plan that works for everyone. It is very important that you meet with a dietitian who will help you create a meal plan that is just right for you.   This information is not intended to replace advice given to you by your health care provider. Make sure you discuss any questions you have with your health care provider.   Document Released: 01/16/2005 Document Revised: 05/12/2014 Document Reviewed: 03/18/2013 Elsevier Interactive Patient Education 2016 Else[employer] Inc.  

## 2016-02-15 NOTE — Progress Notes (Signed)
Refill on test strips

## 2016-02-15 NOTE — Progress Notes (Signed)
Subjective:  Patient ID: Eric Shaw, male    DOB: [birth year]  Age: 55 y.o. MRN: [id number]  CC: Diabetes   HPI Eric Shaw is a 55 year old male with a history of uncontrolled type 2 diabetes mellitus (A1c 13.7), diabetic neuropathy, major depressive disorder who comes into the clinic for a follow-up visit.  At his last visit he had endorsed running out of his medications for the last 4 months and he comes in today for blood sugar review. He does not have his blood sugar log with him but reports that fasting sugars have been around 125 and random sugars 170s; he denies any sugars above 200. He initially had symptoms of hypoglycemia after resuming his medications with associated tremors and sweating but denies any hypoglycemic values; he no longer experiences hypoglycemia.  Past Medical History:  Diagnosis Date  . Arthritis    "all 10 toes" (06/19/2014)  . Depression   . High cholesterol   . Hypertension   . Necrotizing fasciitis ([place])   . Plantar fasciitis, bilateral   . Sleep apnea    "not suppose to wear mask" (06/19/2014)-does not have a cpap-  . Type II diabetes mellitus ([place])     Past Surgical History:  Procedure Laterality Date  . APPLICATION OF A-CELL OF HEAD/NECK N/A 07/05/2014   Procedure: APPLICATION OF A-CELL OF HEAD/NECK;  Surgeon: Izora Gala, MD;  Location: [facility];  Service: ENT;  Laterality: N/A;  . APPLICATION OF A-CELL OF HEAD/NECK N/A 07/20/2014   Procedure: APPLICATION OF A-CELL OF HEAD/NECK;  Surgeon: Theodoro Kos, DO;  Location: [facility];  Service: Plastics;  Laterality: N/A;  . APPLICATION OF A-CELL OF HEAD/NECK N/A 08/02/2014   Procedure: APPLICATION OF A-CELL OF HEAD/NECK;  Surgeon: Theodoro Kos, DO;  Location: [facility];  Service: Plastics;  Laterality: N/A;  . APPLICATION OF WOUND VAC N/A 07/05/2014   Procedure: APPLICATION OF WOUND VAC;  Surgeon: Izora Gala, MD;  Location: [facility];  Service: ENT;  Laterality: N/A;  .  INCISION AND DRAINAGE ABSCESS N/A 06/24/2014   Procedure: INCISION AND DRAINAGE OF POSTERIOR NECK ABCESS;  Surgeon: Izora Gala, MD;  Location: [facility];  Service: ENT;  Laterality: N/A;  . INCISION AND DRAINAGE ABSCESS N/A 06/26/2014   Procedure: REPEAT [place] OF NECK  WOUND;  Surgeon: Izora Gala, MD;  Location: [facility];  Service: ENT;  Laterality: N/A;  . INCISION AND DRAINAGE ABSCESS N/A 06/28/2014   Procedure:  REPEAT DEBRIEDMENT OF NECK  WOUND;  Surgeon: Izora Gala, MD;  Location: [facility];  Service: ENT;  Laterality: N/A;  . INCISION AND DRAINAGE OF WOUND N/A 07/05/2014   Procedure: IRRIGATION AND DEBRIDEMENT WOUND;  Surgeon: Izora Gala, MD;  Location: [facility];  Service: ENT;  Laterality: N/A;  . INCISION AND DRAINAGE OF WOUND N/A 07/20/2014   Procedure: IRRIGATION AND DEBRIDEMENT OF NECROTIZING FASCIITIS WOUND OF NECK WITH PLACEMENT OF ACELL/VAC;  Surgeon: Theodoro Kos, DO;  Location: [facility];  Service: Plastics;  Laterality: N/A;  . INCISION AND DRAINAGE OF WOUND N/A 08/02/2014   Procedure: IRRIGATION AND DEBRIDEMENT OF POSTERIOR NECK WOUND WITH SURGICAL PREP AND PLACEMENT OF A CELL AND VAC;  Surgeon: Theodoro Kos, DO;  Location: [facility];  Service: Plastics;  Laterality: N/A;  . INCISION AND DRAINAGE OF WOUND N/A 08/10/2014   Procedure: IRRIGATION AND DEBRIDEMENT OF POSTERIOR NECK WOUND WITH SURGICAL PREP WITH SPLIT THICKNESS SKIN GRAFT AND PLACEMENT OF VAC;  Surgeon: Theodoro Kos, DO;  Location:  [facility];  Service: Plastics;  Laterality: N/A;  . REDUCTION OF TORSION OF TESTIS Left 1979  . SKIN SPLIT GRAFT N/A 08/10/2014   Procedure: SKIN GRAFT SPLIT THICKNESS;  Surgeon: Theodoro Kos, DO;  Location: [facility];  Service: Plastics;  Laterality: N/A;  From Left Posterior Thigh  . WOUND DEBRIDEMENT N/A 06/24/2014   Procedure: DEBRIDEMENT OF NECROTIZING FASCITIS ON POSTERIOR NECK;  Surgeon: Izora Gala, MD;  Location: [facility];  Service:  ENT;  Laterality: N/A;    No Known Allergies    Outpatient Medications Prior to Visit  Medication Sig Dispense Refill  . atenolol (TENORMIN) 50 MG tablet Take 1 tablet (50 mg total) by mouth daily. 30 tablet 4  . atorvastatin (LIPITOR) 20 MG tablet Take 1 tablet (20 mg total) by mouth daily. 30 tablet 3  . Blood Glucose Monitoring Suppl (MEIJER TRUERESULT GLUCOSE SYS) W/DEVICE KIT Check sugars 4 times a day 1 kit 0  . canagliflozin (INVOKANA) 100 MG TABS tablet Take 1 tablet (100 mg total) by mouth daily before breakfast. 30 tablet 5  . gabapentin (NEURONTIN) 300 MG capsule Take 4 capsules (1,200 mg total) by mouth 2 (two) times daily. 240 capsule 5  . insulin aspart (NOVOLOG FLEXPEN) 100 UNIT/ML FlexPen Inject 3-20 Units into the skin 3 (three) times daily with meals. As per sliding scale 15 mL 5  . Insulin Syringes, Disposable, U-100 1 ML MISC Use Novolog 3 times per day 100 each 5  . Lancets (ACCU-CHEK MULTICLIX) lancets Use as instructed 100 each 12  . lisinopril (PRINIVIL,ZESTRIL) 5 MG tablet Take 2 tablets (10 mg total) by mouth daily. For hypertension and renal protection. 30 tablet 5  . metFORMIN (GLUCOPHAGE) 1000 MG tablet Take 1 tablet (1,000 mg total) by mouth 2 (two) times daily with a meal. 60 tablet 5  . glucose blood (AGAMATRIX PRESTO TEST) test strip Use as instructed 100 each 11  . Insulin Detemir (LEVEMIR) 100 UNIT/ML Pen Inject 50 Units into the skin 2 (two) times daily. 15 mL 5  . ARIPiprazole (ABILIFY) 5 MG tablet Take 1 tablet (5 mg total) by mouth every morning. (Patient not taking: Reported on 02/15/2016) 30 tablet 0  . DULoxetine (CYMBALTA) 30 MG capsule Take 3 capsules (90 mg total) by mouth every morning. (Patient not taking: Reported on 02/15/2016) 30 capsule 0   No facility-administered medications prior to visit.     ROS Review of Systems  Constitutional: Negative for activity change and appetite change.  HENT: Negative for sinus pressure and sore throat.     Respiratory: Negative for chest tightness, shortness of breath and wheezing.   Cardiovascular: Negative for chest pain and palpitations.  Gastrointestinal: Negative for abdominal distention, abdominal pain and constipation.  Genitourinary: Negative.   Musculoskeletal: Negative.   Neurological: Positive for numbness.  Psychiatric/Behavioral: Negative for behavioral problems and dysphoric mood.    Objective:  BP 127/71 (BP Location: Right Arm, Patient Position: Sitting, Cuff Size: Large)   Pulse 91   Temp 98.2 F (36.8 C) (Oral)   Ht 5' 10.5" (1.791 m)   Wt 231 lb 9.6 oz (105.1 kg)   SpO2 99%   BMI 32.76 kg/m   BP/Weight 02/15/2016 01/01/2016 06/12/221  Systolic BP 361 224 497  Diastolic BP 71 81 83  Wt. (Lbs) 231.6 220.2 -  BMI 32.76 31.6 -  Some encounter information is confidential and restricted. Go to Review Flowsheets activity to see all data.      Physical Exam  Constitutional: He is oriented to person, place, and time. He appears well-developed and well-nourished.  Cardiovascular: Normal heart rate, rhythm and heart sounds and intact distal pulses.   No murmur heard. Pulmonary/Chest: Effort normal and breath sounds normal. He has no wheezes. He has no rales. He exhibits no tenderness.  Abdominal: Soft. Bowel sounds are normal. He exhibits no distension and no mass. There is no tenderness.  Musculoskeletal: Normal range of motion.  Neurological: He is alert and oriented to person, place, and time.     Lab Results  Component Value Date   HGBA1C 13.7 01/01/2016    Assessment & Plan:   1. Type 2 diabetes mellitus with peripheral neuropathy (HCC) Uncontrolled with A1c of 13.7 Blood sugars have improved based on blood sugar log will We'll make no regimen changes - Glucose (CBG)  2. Other diabetic neurological complication associated with type 2 diabetes mellitus ([place]) Uncontrolled Continue gabapentin Hopefully once he resumes Cymbalta which is prescribed by  mental health symptoms to be more controlled - Insulin Detemir (LEVEMIR) 100 UNIT/ML Pen; Inject 50 Units into the skin 2 (two) times daily.  Dispense: 7 pen; Refill: 5  3. Encounter for immunization - Glucose (CBG) - Insulin Detemir (LEVEMIR) 100 UNIT/ML Pen; Inject 50 Units into the skin 2 (two) times daily.  Dispense: 7 pen; Refill: 5 - Flu Vaccine QUAD 36+ mos IM - glucose blood (AGAMATRIX PRESTO TEST) test strip; Use as instructed  Dispense: 100 each; Refill: 11  4. Need for prophylactic vaccination and inoculation against influenza - Flu Vaccine QUAD 36+ mos IM   Meds ordered this encounter  Medications  . Insulin Detemir (LEVEMIR) 100 UNIT/ML Pen    Sig: Inject 50 Units into the skin 2 (two) times daily.    Dispense:  7 pen    Refill:  5  . glucose blood (AGAMATRIX PRESTO TEST) test strip    Sig: Use as instructed    Dispense:  100 each    Refill:  11    Follow-up: Return in about 3 months (around 05/17/2016) for Follow-up of diabetes mellitus.   Arnoldo Morale MD

## 2016-02-27 DIAGNOSIS — E113512 Type 2 diabetes mellitus with proliferative diabetic retinopathy with macular edema, left eye: Secondary | ICD-10-CM | POA: Diagnosis not present

## 2016-03-07 DIAGNOSIS — E113512 Type 2 diabetes mellitus with proliferative diabetic retinopathy with macular edema, left eye: Secondary | ICD-10-CM | POA: Diagnosis not present

## 2016-03-12 ENCOUNTER — Telehealth: Payer: Self-pay | Admitting: Family Medicine

## 2016-03-12 ENCOUNTER — Other Ambulatory Visit: Payer: Self-pay | Admitting: Pharmacist

## 2016-03-12 DIAGNOSIS — E113511 Type 2 diabetes mellitus with proliferative diabetic retinopathy with macular edema, right eye: Secondary | ICD-10-CM | POA: Diagnosis not present

## 2016-03-12 DIAGNOSIS — E113512 Type 2 diabetes mellitus with proliferative diabetic retinopathy with macular edema, left eye: Secondary | ICD-10-CM | POA: Diagnosis not present

## 2016-03-12 MED ORDER — INSULIN LISPRO 100 UNIT/ML (KWIKPEN): 3.0000 [IU] | PEN_INJECTOR | Freq: Three times a day (TID) | SUBCUTANEOUS | 0 refills | Status: AC

## 2016-03-12 MED ORDER — GLUCOSE BLOOD VI STRP: ORAL_STRIP | 12 refills | Status: AC

## 2016-03-12 MED ORDER — ACCU-CHEK AVIVA PLUS W/DEVICE KIT: PACK | 0 refills | Status: AC

## 2016-03-12 MED ORDER — EMPAGLIFLOZIN 10 MG PO TABS: 10.0000 mg | ORAL_TABLET | Freq: Every day | ORAL | 2 refills | Status: AC

## 2016-03-12 MED ORDER — INSULIN GLARGINE 100 UNIT/ML SOLOSTAR PEN: 50.0000 [IU] | PEN_INJECTOR | Freq: Two times a day (BID) | SUBCUTANEOUS | 0 refills | Status: AC

## 2016-03-12 NOTE — Telephone Encounter (Signed)
[facility]

## 2016-03-12 NOTE — Telephone Encounter (Signed)
Pt. Came into facility stating that he needs to have his medications changed b/c his first coverage is Medicare now not Medicaid.   Insulin Detemir (LEVEMIR) 100 UNIT/ML Pen  To [place] Flex Pen  insulin aspart (NOVOLOG FLEXPEN) 100 UNIT/ML FlexPen To Humolog Flex Pen  Pt. Also stated that he needs [place] and Accucheck meter and strips. Pt. Would like the Rx sent to [facility] on [address] [place] [facility] [facility] [place] [facility] [place]

## 2016-03-18 DIAGNOSIS — E113512 Type 2 diabetes mellitus with proliferative diabetic retinopathy with macular edema, left eye: Secondary | ICD-10-CM | POA: Diagnosis not present

## 2016-03-18 DIAGNOSIS — E113511 Type 2 diabetes mellitus with proliferative diabetic retinopathy with macular edema, right eye: Secondary | ICD-10-CM | POA: Diagnosis not present

## 2016-03-19 ENCOUNTER — Other Ambulatory Visit: Payer: Self-pay | Admitting: Family Medicine

## 2016-03-19 MED ORDER — INSULIN ASPART 100 UNIT/ML FLEXPEN: 3.0000 [IU] | PEN_INJECTOR | Freq: Three times a day (TID) | SUBCUTANEOUS | 5 refills | Status: AC

## 2016-04-08 DIAGNOSIS — E113512 Type 2 diabetes mellitus with proliferative diabetic retinopathy with macular edema, left eye: Secondary | ICD-10-CM | POA: Diagnosis not present

## 2016-04-13 IMAGING — CT CT NECK W/ CM
4 of 5 series · 15 of 33 positions shown, 17 images · IV contrast (APPLIED)
Comparison: 06/19/2014

CLINICAL DATA: Ingrown hair on back of neck which became infected.
Yellowish drainage from the wound.

EXAM:
CT NECK WITH CONTRAST
TECHNIQUE: Multidetector CT imaging of the neck was performed using the
standard protocol following the bolus administration of intravenous
contrast.
CONTRAST:  75mL OMNIPAQUE IOHEXOL 300 MG/ML  SOLN

[Series 2: neck 2.0 i31s 3 · axial · 0.58mm/px · z∈[+1207,+1345]mm · 4 of 115 slices shown, 5 images]
[im 23/115  soft-tissue]
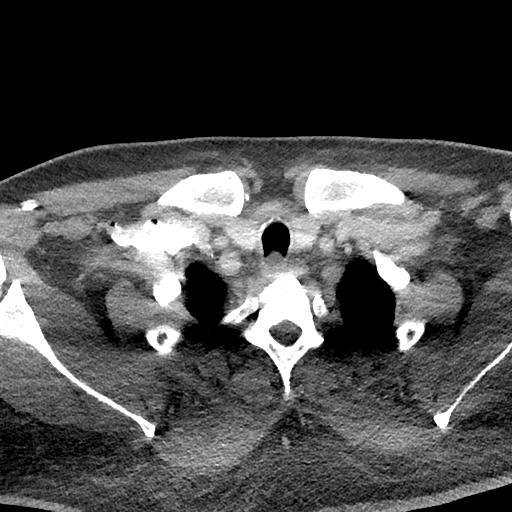
[im 23/115  bone]
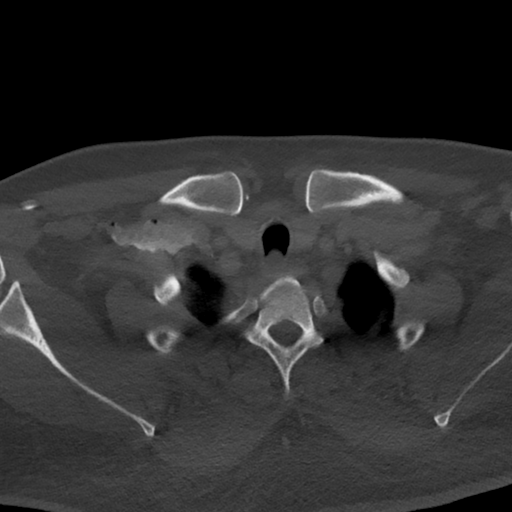
[im 46/115  bone]
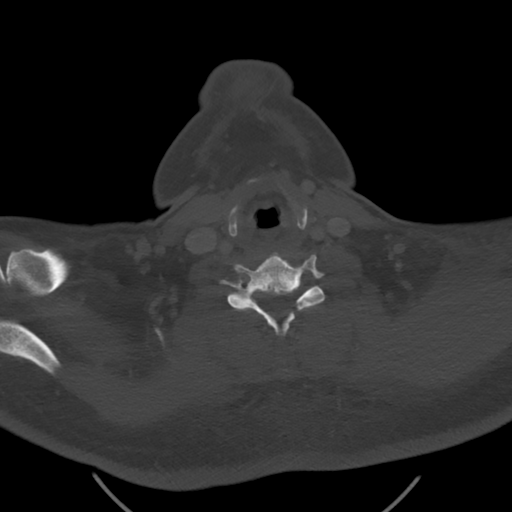
[im 69/115  bone]
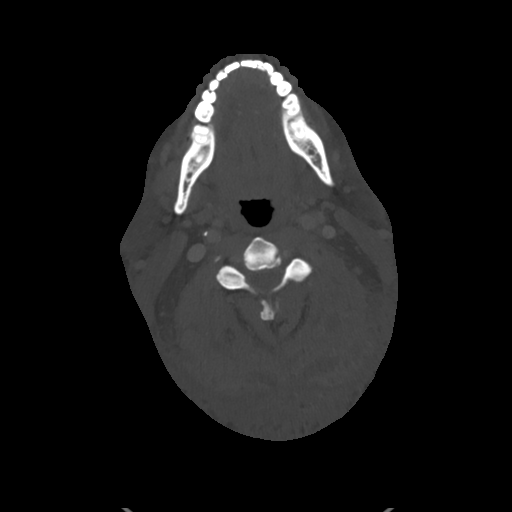
[im 92/115  bone]
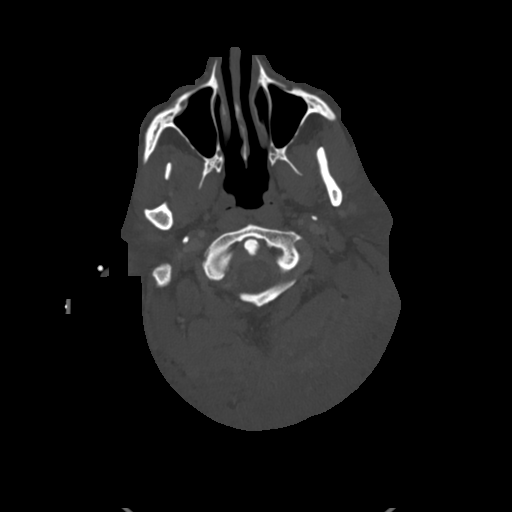

[Series 5: coronal st · coronal · 0.48mm/px · 3 of 127 slices shown]
[im 26/127  bone]
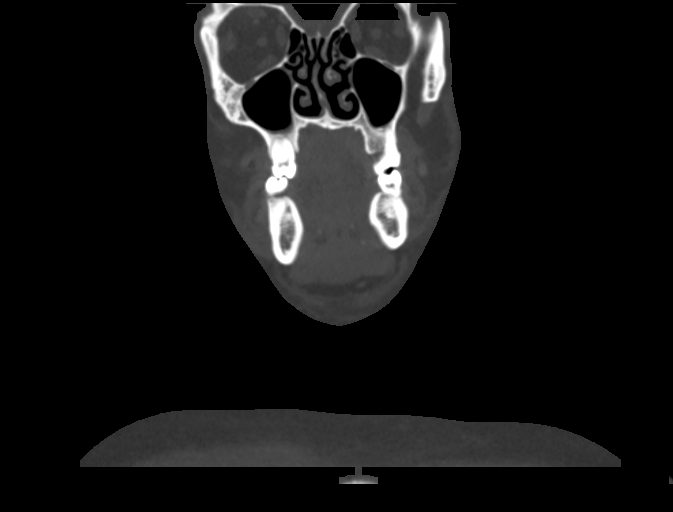
[im 51/127  bone]
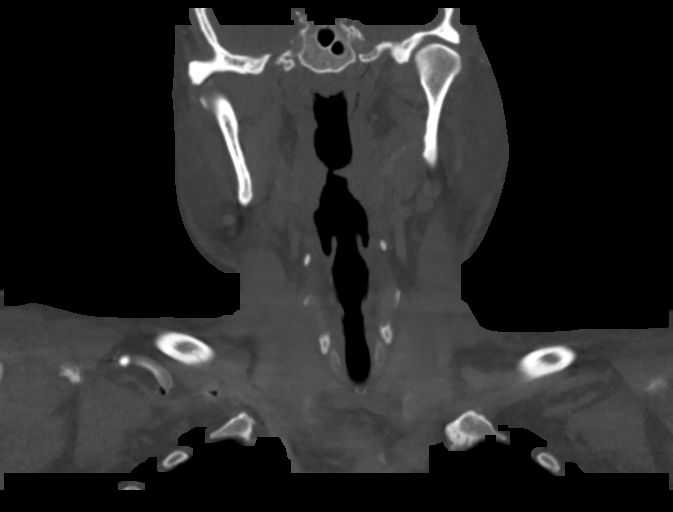
[im 76/127  bone]
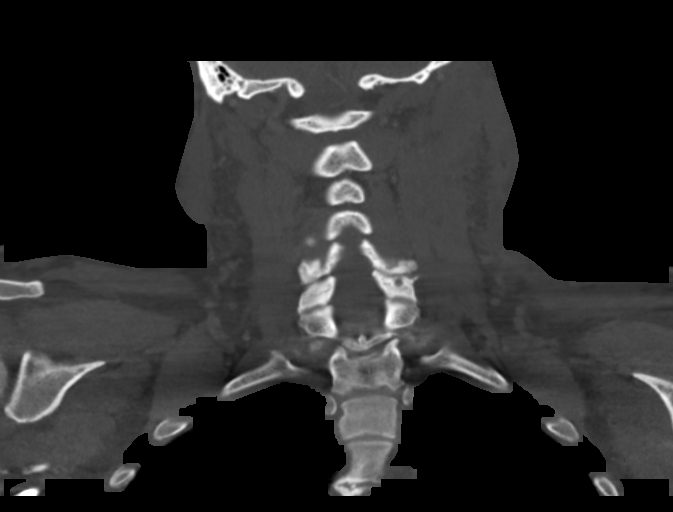

[Series 6: sagittal st · sagittal · 0.50mm/px · 5 of 99 slices shown, 6 images]
[im 33/99  bone]
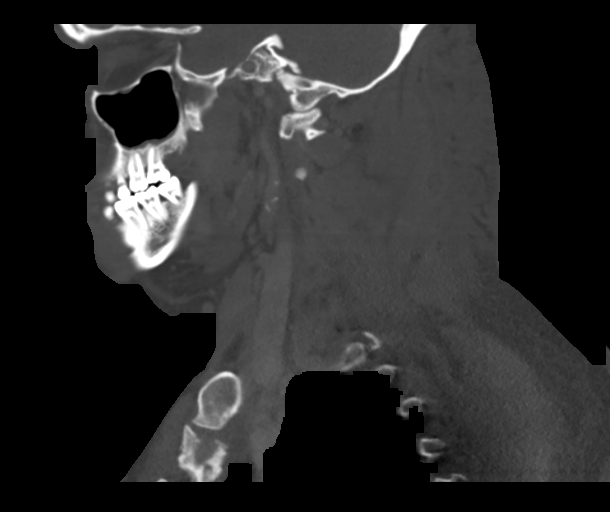
[im 41/99  bone]
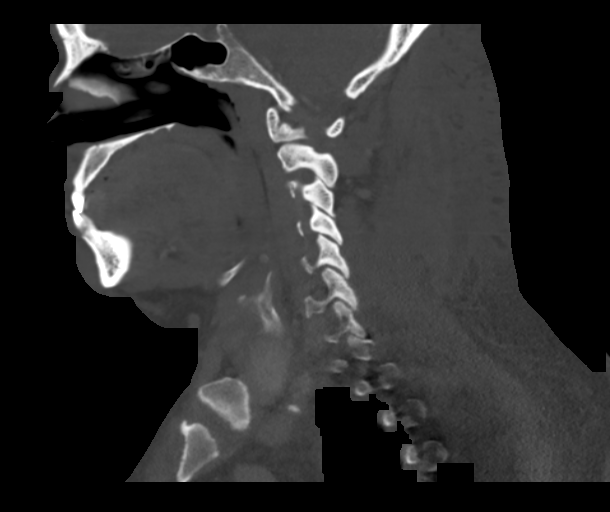
[im 50/99  soft-tissue]
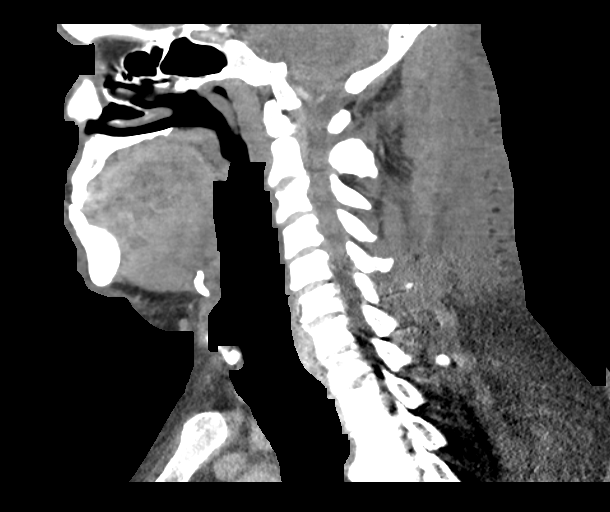
[im 50/99  bone]
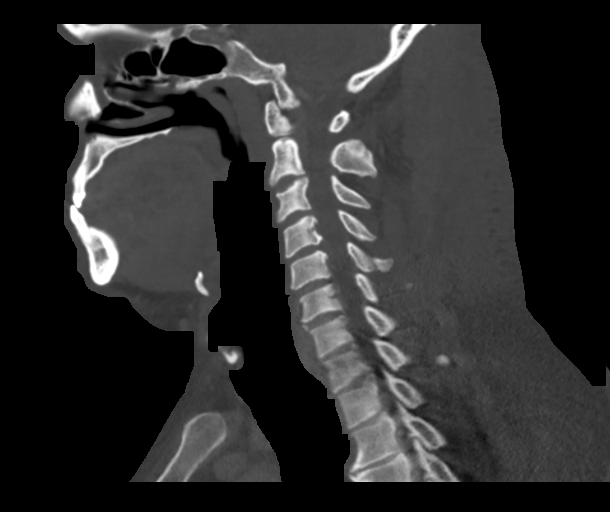
[im 58/99  bone]
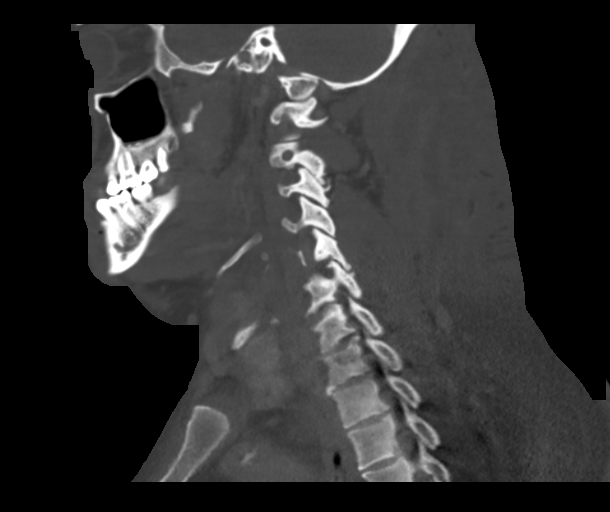
[im 66/99  bone]
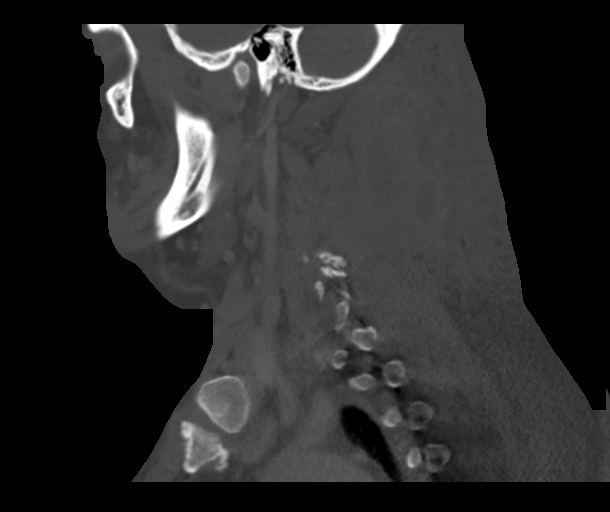

[Series 7: orthogonal st · axial · 0.59mm/px · z∈[+1189,+1288]mm · 3 of 126 slices shown]
[im 26/126  bone]
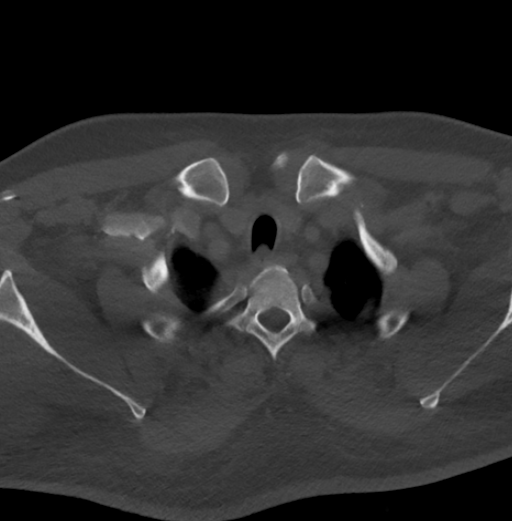
[im 51/126  bone]
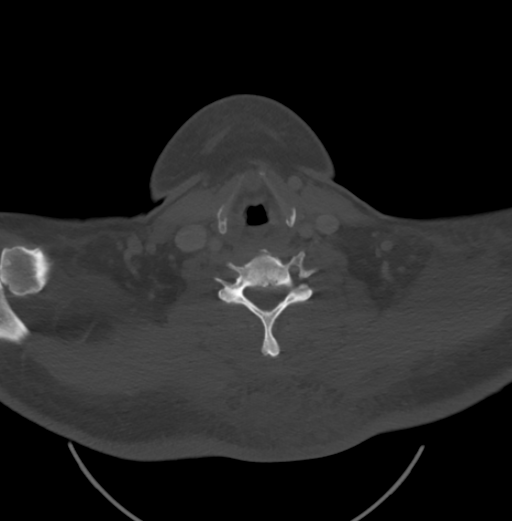
[im 76/126  bone]
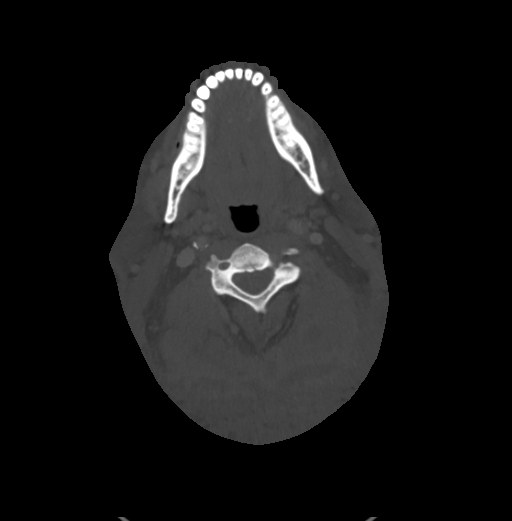

[15 of 33 positions shown; findings below may reference images not displayed]

FINDINGS: Progressive marked skin thickening and subcutaneous
reticulation/expansion throughout the posterior neck and occipital
scalp, consistent with severe cellulitis. As before, there is
expansion and edema in the bilateral upper trapezius consistent with
myositis. There is also extensive inflammation deep to the
trapezius, with thin fluid collections in the left more than right
posterior cervical space fat. Below the deep fascia, in the
paraspinal compartment, is increasingly discrete fluid between the
left semispinalis capitis and splenius capitis, measuring 12 x 35 x
60 mm. This fluid has rim enhancement and mass effect and is most
consistent with combination of abscess and phlegmon. There is mild
retropharyngeal edema which is not progressed. No retropharyngeal
abscess or phlegmon.

No evidence of intrathoracic or spinal infection.

Pharynx and larynx: Normal

Salivary glands: Normal

Thyroid: Normal

Lymph nodes: No separate adenopathy.

Vascular: Cervical carotid atherosclerosis. No major vessel
occlusion.

Limited intracranial: Negative

Visualized orbits: Negative

Mastoids and visualized paranasal sinuses: Clear

Skeleton: Congenitally narrow spinal canal with superimposed lower
cervical degenerative disc disease. No evidence of osseous
infection.

Upper chest: No evidence of pneumonia.
IMPRESSION: 1. Progressive posterior cervical/occipital cellulitis and myositis.
There is a mix of phlegmon and abscess in the bilateral posterior
cervical space and left paraspinal compartment, as above.
2. Stable mild retropharyngeal edema.

## 2016-08-21 ENCOUNTER — Other Ambulatory Visit: Payer: Self-pay | Admitting: Pharmacist

## 2016-08-21 ENCOUNTER — Other Ambulatory Visit: Payer: Self-pay | Admitting: Family Medicine

## 2016-08-21 DIAGNOSIS — E1149 Type 2 diabetes mellitus with other diabetic neurological complication: Secondary | ICD-10-CM

## 2016-08-21 MED ORDER — METFORMIN HCL 1000 MG PO TABS: 1000.0000 mg | ORAL_TABLET | Freq: Two times a day (BID) | ORAL | 0 refills | Status: DC

## 2016-09-10 ENCOUNTER — Other Ambulatory Visit: Payer: Self-pay | Admitting: Family Medicine

## 2016-09-10 ENCOUNTER — Other Ambulatory Visit: Payer: Self-pay | Admitting: *Deleted

## 2016-09-10 DIAGNOSIS — E1149 Type 2 diabetes mellitus with other diabetic neurological complication: Secondary | ICD-10-CM

## 2016-09-10 MED ORDER — METFORMIN HCL 1000 MG PO TABS: 1000.0000 mg | ORAL_TABLET | Freq: Two times a day (BID) | ORAL | 0 refills | Status: AC

## 2017-01-05 ENCOUNTER — Other Ambulatory Visit: Payer: Self-pay | Admitting: Family Medicine

## 2017-01-05 DIAGNOSIS — E1149 Type 2 diabetes mellitus with other diabetic neurological complication: Secondary | ICD-10-CM

## 2017-08-30 ENCOUNTER — Other Ambulatory Visit: Payer: Self-pay | Admitting: Family Medicine

## 2019-02-03 DEATH — deceased
